# Patient Record
Sex: Male | Born: 1965
Health system: Southern US, Community
[De-identification: ages and names within clinical notes are randomized; demographics above are authoritative.]

## PROBLEM LIST (undated history)

## (undated) DIAGNOSIS — M1990 Unspecified osteoarthritis, unspecified site: Secondary | ICD-10-CM

## (undated) DIAGNOSIS — I10 Essential (primary) hypertension: Secondary | ICD-10-CM

## (undated) DIAGNOSIS — G8929 Other chronic pain: Secondary | ICD-10-CM

## (undated) DIAGNOSIS — M549 Dorsalgia, unspecified: Secondary | ICD-10-CM

## (undated) HISTORY — DX: Unspecified osteoarthritis, unspecified site: M19.90

---

## 1998-10-22 ENCOUNTER — Emergency Department (HOSPITAL_COMMUNITY): Admission: EM | Admit: 1998-10-22 | Discharge: 1998-10-22 | Payer: Self-pay | Admitting: Emergency Medicine

## 2009-12-21 ENCOUNTER — Ambulatory Visit (HOSPITAL_COMMUNITY): Admission: RE | Admit: 2009-12-21 | Discharge: 2009-12-21 | Payer: Self-pay | Admitting: Family Medicine

## 2011-05-08 ENCOUNTER — Encounter (HOSPITAL_COMMUNITY): Payer: Self-pay | Admitting: Adult Health

## 2011-05-08 ENCOUNTER — Emergency Department (HOSPITAL_COMMUNITY): Payer: Worker's Compensation

## 2011-05-08 ENCOUNTER — Emergency Department (HOSPITAL_COMMUNITY)
Admission: EM | Admit: 2011-05-08 | Discharge: 2011-05-08 | Disposition: A | Discharge status: Home / Self Care | Payer: Worker's Compensation | Attending: Emergency Medicine | Admitting: Emergency Medicine

## 2011-05-08 DIAGNOSIS — W11XXXA Fall on and from ladder, initial encounter: Secondary | ICD-10-CM | POA: Insufficient documentation

## 2011-05-08 DIAGNOSIS — M545 Low back pain, unspecified: Secondary | ICD-10-CM | POA: Insufficient documentation

## 2011-05-08 DIAGNOSIS — S32010A Wedge compression fracture of first lumbar vertebra, initial encounter for closed fracture: Secondary | ICD-10-CM

## 2011-05-08 DIAGNOSIS — S32009A Unspecified fracture of unspecified lumbar vertebra, initial encounter for closed fracture: Secondary | ICD-10-CM | POA: Insufficient documentation

## 2011-05-08 DIAGNOSIS — I10 Essential (primary) hypertension: Secondary | ICD-10-CM | POA: Insufficient documentation

## 2011-05-08 DIAGNOSIS — T148XXA Other injury of unspecified body region, initial encounter: Secondary | ICD-10-CM | POA: Insufficient documentation

## 2011-05-08 HISTORY — DX: Essential (primary) hypertension: I10

## 2011-05-08 LAB — URINALYSIS, MICROSCOPIC ONLY
Glucose, UA: NEGATIVE mg/dL
Ketones, ur: NEGATIVE mg/dL
Leukocytes, UA: NEGATIVE
Nitrite: NEGATIVE
pH: 6.5 (ref 5.0–8.0)

## 2011-05-08 MED ORDER — IBUPROFEN 800 MG PO TABS: 800.0000 mg | ORAL_TABLET | Freq: Three times a day (TID) | ORAL | Status: AC

## 2011-05-08 MED ORDER — IOHEXOL 300 MG/ML  SOLN
100.0000 mL | Freq: Once | INTRAMUSCULAR | Status: AC | PRN
  Administered 2011-05-08: 100 mL via INTRAVENOUS

## 2011-05-08 MED ORDER — TETANUS-DIPHTH-ACELL PERTUSSIS 5-2.5-18.5 LF-MCG/0.5 IM SUSP
INTRAMUSCULAR | Status: AC
  Administered 2011-05-08: 0.5 mL via INTRAMUSCULAR
  Filled 2011-05-08: qty 0.5

## 2011-05-08 MED ORDER — KETOROLAC TROMETHAMINE 30 MG/ML IJ SOLN
30.0000 mg | Freq: Once | INTRAMUSCULAR | Status: AC
  Administered 2011-05-08: 30 mg via INTRAVENOUS
  Filled 2011-05-08: qty 1

## 2011-05-08 MED ORDER — OXYCODONE-ACETAMINOPHEN 5-325 MG PO TABS: 2.0000 | ORAL_TABLET | ORAL | Status: AC | PRN

## 2011-05-08 MED ORDER — OXYCODONE-ACETAMINOPHEN 5-325 MG PO TABS
2.0000 | ORAL_TABLET | Freq: Once | ORAL | Status: AC
  Administered 2011-05-08: 2 via ORAL
  Filled 2011-05-08 (×2): qty 1

## 2011-05-08 NOTE — ED Notes (Addendum)
Fell from 15 foot ladder, straight down, landed on lower back and on top of ladder.  C/o back pain and rib pain

## 2011-05-08 NOTE — ED Provider Notes (Signed)
Patient was signed out to me by PA Langley Adie. At the time of signout the patient was pending CT of abdomen and pelvis. Patient had known L1 compression fracture. CT did show 30% compression with no posterior element involvement. There is also subcutaneous hematoma of the right buttock visualized. There are no other significant findings. Patient was notified of his left renal cysts. Patient was given a dose of Toradol was discharged home with prescription for ibuprofen as well as Percocet. He was referred to Dr. August Saucer for followup regarding his compression fracture. Patient had no neurologic symptoms, evaluation. He was discharged in good condition.  Diagnoses: 1. L1 compression fracture 2. Hematoma of the right buttock  Cyndra Numbers, MD 05/08/11 1744

## 2011-05-08 NOTE — Discharge Instructions (Signed)
Back, Compression Fracture  A compression fracture happens when a force is put upon the length of your spine. Slipping and falling on your bottom are examples of such a force. When this happens, sometimes the force is great enough to compress the building blocks (vertebral bodies) of your spine. Although this causes a lot of pain, this can usually be treated at home, unless your caregiver feels hospitalization is needed for pain control.  Your backbone (spinal column) is made up of 24 main vertebral bodies in addition to the sacrum and coccyx (see illustration). These are held together by tough fibrous tissues (ligaments) and by support of your muscles. Nerve roots pass through the openings between the vertebrae. A sudden wrenching move, injury, or a fall may cause a compression fracture of one of the vertebral bodies. This may result in back pain or spread of pain into the belly (abdomen), the buttocks, and down the leg into the foot. Pain may also be created by muscle spasm alone.  Large studies have been undertaken to determine the best possible course of action to help your back following injury and also to prevent future problems. The recommendations are as follows.  FOLLOWING A COMPRESSION FRACTURE:  Do the following only if advised by your caregiver.    If a back brace has been suggested or provided, wear it as directed.   DO NOT stop wearing the back brace unless instructed by your caregiver.   When allowed to return to regular activities, avoid a sedentary life style. Actively exercise. Sporadic weekend binges of tennis, racquetball, water skiing, may actually aggravate or create problems, especially if you are not in condition for that activity.   Avoid sports requiring sudden body movements until you are in condition for them. Swimming and walking are safer activities.   Maintain good posture.   Avoid obesity.   If not already done, you should have a DEXA scan. Based on the results, be treated for  osteoporosis.  FOLLOWING ACUTE (SUDDEN) INJURY:   Only take over-the-counter or prescription medicines for pain, discomfort, or fever as directed by your caregiver.   Use bed rest for only the most extreme acute episode. Prolonged bed rest may aggravate your condition. Ice used for acute conditions is effective. Use a large plastic bag filled with ice. Wrap it in a towel. This also provides excellent pain relief. This may be continuous. Or use it for 30 minutes every 2 hours during acute phase, then as needed. Heat for 30 minutes prior to activities is helpful.   As soon as the acute phase (the time when your back is too painful for you to do normal activities) is over, it is important to resume normal activities and work hardening programs. Back injuries can cause potentially marked changes in lifestyle. So it is important to attack these problems aggressively.   See your caregiver for continued problems. He or she can help or refer you for appropriate exercises, physical therapy and work hardening if needed.   If you are given narcotic medications for your condition, for the next 24 hours DO NOT:   Drive   Operate machinery or power tools.   Sign legal documents.   DO NOT drink alcohol, take sleeping pills or other medications that may interfere with treatment.  If your caregiver has given you a follow-up appointment, it is very important to keep that appointment. Not keeping the appointment could result in a chronic or permanent injury, pain, and disability. If there is any   back to this facility for assistance.  SEEK IMMEDIATE MEDICAL CARE IF:  You develop numbness, tingling, weakness, or problems with the use of your arms or legs.   You develop severe back pain not relieved with medications.   You have changes in bowel or bladder control.   You have increasing pain in any areas of the body.  Document Released: 02/18/2005  Document Revised: 02/07/2011 Document Reviewed: 09/23/2007 [facility] Patient Information 2012 [place], [place].Hematoma A hematoma is a pocket of blood that collects under the skin, in an organ, in a body space, in a joint space, or in other tissue. The blood can clot to form a lump that you can see and feel. The lump is often firm, sore, and sometimes even painful and tender. Most hematomas get better in a few days to weeks. However, some hematomas may be serious and require medical care.Hematomas can range in size from very small to very large. CAUSES  A hematoma can be caused by a blunt or penetrating injury. It can also be caused by leakage from a blood vessel under the skin. Spontaneous leakage from a blood vessel is more likely to occur in elderly people, especially those taking blood thinners. Sometimes, a hematoma can develop after certain medical procedures. SYMPTOMS  Unlike a bruise, a hematoma forms a firm lump that you can feel. This lump is the collection of blood. The collection of blood can also cause your skin to turn a blue to dark blue color. If the hematoma is close to the surface of the skin, it often produces a yellowish color in the skin. DIAGNOSIS  Your caregiver can determine whether you have a hematoma based on your history and a physical exam. TREATMENT  Hematomas usually go away on their own over time. Rarely does the blood need to be drained out of the body. HOME CARE INSTRUCTIONS   Put ice on the injured area.   Put ice in a plastic bag.   Place a towel between your skin and the bag.   Leave the ice on for 15 to 20 minutes, 3 to 4 times a day for the first 1 to 2 days.   After the first 2 days, switch to using warm compresses on the hematoma.   Elevate the injured area to help decrease pain and swelling. Wrapping the area with an elastic bandage may also be helpful. Compression helps to reduce swelling and promotes shrinking of the hematoma. Make sure the bandage is  not wrapped too tight.   If your hematoma is on a lower extremity and is painful, crutches may be helpful for a couple days.   Only take over-the-counter or prescription medicines for pain, discomfort, or fever as directed by your caregiver. Most patients can take acetaminophen or ibuprofen for the pain.  SEEK IMMEDIATE MEDICAL CARE IF:   You have increasing pain, or your pain is not controlled with medicine.   You have a fever.   You have worsening swelling or discoloration.   Your skin over the hematoma breaks or starts bleeding.  MAKE SURE YOU:   Understand these instructions.   Will watch your condition.   Will get help right away if you are not doing well or get worse.  Document Released: 10/03/2003 Document Revised: 02/07/2011 Document Reviewed: 10/22/2010 [facility] Patient Information 2012 [place], [place].

## 2011-05-08 NOTE — ED Provider Notes (Signed)
History     CSN: [id number]  Arrival date & time 05/08/11  1349   First MD Initiated Contact with Patient 05/08/11 1359      Chief Complaint  Patient presents with  . Fall    (Consider location/radiation/quality/duration/timing/severity/associated sxs/prior treatment) Patient is a 46 y.o. male presenting with fall. The history is provided by the patient.  Fall The accident occurred less than 1 hour ago. The fall occurred from a ladder. He fell from a height of 11 to 15 ft. He landed on dirt. There was no blood loss. Point of impact: His ladder slip in the mud and patient landed on his feet, falling backward onto his back.  Pain location: His only complaint is lower back pain. He was ambulatory at the scene. Pertinent negatives include no fever, no numbness and no abdominal pain. Associated symptoms comments: He denies having hit his head or presence of neck, chest or abdominal pain. He reports he got himself up from the scene of fall.. Treatment on scene includes a backboard and a c-collar. He has tried nothing for the symptoms.    Past Medical History  Diagnosis Date  . Hypertension     History reviewed. No pertinent past surgical history.  History reviewed. No pertinent family history.  History  Substance Use Topics  . Smoking status: Former Smoker    Types: Cigarettes  . Smokeless tobacco: Not on file  . Alcohol Use: No      Review of Systems  Constitutional: Negative for fever and chills.  HENT: Negative.   Respiratory: Negative.   Cardiovascular: Negative.   Gastrointestinal: Negative.  Negative for abdominal pain.  Musculoskeletal: Positive for back pain.  Skin: Negative.   Neurological: Negative.  Negative for dizziness, light-headedness and numbness.    Allergies  Review of patient's allergies indicates no known allergies.  Home Medications   Current Outpatient Rx  Name Route Sig Dispense Refill  . LOSARTAN POTASSIUM 100 MG PO TABS Oral Take 100 mg by  mouth daily.    Marland Kitchen VARENICLINE TARTRATE 0.5 MG PO TABS Oral Take 0.5 mg by mouth 2 (two) times daily.      BP 161/97  Temp(Src) 97.6 F (36.4 C) (Oral)  Resp 18  Physical Exam  Constitutional: He is oriented to person, place, and time. He appears well-developed and well-nourished.  HENT:  Head: Normocephalic and atraumatic.  Neck: Normal range of motion. Neck supple.  Cardiovascular: Normal rate and regular rhythm.   Pulmonary/Chest: Effort normal and breath sounds normal.  Abdominal: Soft. Bowel sounds are normal. There is no tenderness. There is no rebound and no guarding.  Musculoskeletal: Normal range of motion.       Mild mid-line lumbar tenderness. No swelling or discoloration.  Neurological: He is alert and oriented to person, place, and time. He displays normal reflexes. No cranial nerve deficit. Coordination normal.  Skin: Skin is warm and dry. No rash noted.  Psychiatric: He has a normal mood and affect.    ED Course  Procedures (including critical care time)  Labs Reviewed - No data to display Dg Lumbar Spine Complete  05/08/2011  *RADIOLOGY REPORT*  Clinical Data: History of fall off a ladder earlier today complaining of low back pain.  LUMBAR SPINE - COMPLETE 4+ VIEW  Comparison: No priors.  Findings: There is a compression fracture of L1 with approximately 30% loss of anterior vertebral body height. Frontal projection demonstrates a focal scoliotic deformity at the L1 level (convex to the right).  No  definite retropulsion of fracture fragments is noted on the lateral projection at this time to suggest a burst type fracture.  No other fractures are confidently identified. Mild multilevel degenerative disc disease is noted.  Facet arthropathy is seen throughout the lumbar spine, most severe at L4- L5 and L5-S1.  IMPRESSION: 1.  Compression fracture of L1 with approximately 30% loss of anterior vertebral body height.  No priors are available for comparison, however, based on  patient's history and symptoms, this is favored to represent an acute injury.  This could be confirmed with CT of the lumbar spine if clinically indicated.  Original Report Authenticated By: Florencia Reasons, M.D.     No diagnosis found.    MDM          Rodena Medin, PA-C 05/08/11 1533

## 2011-05-09 NOTE — ED Provider Notes (Signed)
Medical screening examination/treatment/procedure(s) were conducted as a shared visit with non-physician practitioner(s) and myself.  I personally evaluated the patient during the encounter This 46 year old male presents following a fall that occurred as a ladder he was on slid away from the structure it was leaning against.  The patient's description of a fall of greater than 10 feet from his abdominal tenderness, his L1 fracture demonstrated on x-ray was concerning for hemorrhage or soft tissue injury.  The patient's CT did not demonstrate these findings.  On my exam the patient was in no distress, though he was quite comfortable.  The patient was discharged in stable condition.  Gerhard Munch, MD 05/09/11 [phone]

## 2012-04-12 ENCOUNTER — Emergency Department (HOSPITAL_COMMUNITY)
Admission: EM | Admit: 2012-04-12 | Discharge: 2012-04-12 | Disposition: A | Discharge status: Home / Self Care | Payer: BC Managed Care – PPO | Attending: Emergency Medicine | Admitting: Emergency Medicine

## 2012-04-12 ENCOUNTER — Emergency Department (HOSPITAL_COMMUNITY): Payer: BC Managed Care – PPO

## 2012-04-12 ENCOUNTER — Encounter (HOSPITAL_COMMUNITY): Payer: Self-pay

## 2012-04-12 DIAGNOSIS — Z87891 Personal history of nicotine dependence: Secondary | ICD-10-CM | POA: Insufficient documentation

## 2012-04-12 DIAGNOSIS — R1909 Other intra-abdominal and pelvic swelling, mass and lump: Secondary | ICD-10-CM | POA: Insufficient documentation

## 2012-04-12 DIAGNOSIS — M799 Soft tissue disorder, unspecified: Secondary | ICD-10-CM

## 2012-04-12 DIAGNOSIS — G8929 Other chronic pain: Secondary | ICD-10-CM | POA: Insufficient documentation

## 2012-04-12 DIAGNOSIS — R21 Rash and other nonspecific skin eruption: Secondary | ICD-10-CM

## 2012-04-12 DIAGNOSIS — Z79899 Other long term (current) drug therapy: Secondary | ICD-10-CM | POA: Insufficient documentation

## 2012-04-12 DIAGNOSIS — I10 Essential (primary) hypertension: Secondary | ICD-10-CM | POA: Insufficient documentation

## 2012-04-12 DIAGNOSIS — M549 Dorsalgia, unspecified: Secondary | ICD-10-CM | POA: Insufficient documentation

## 2012-04-12 HISTORY — DX: Dorsalgia, unspecified: M54.9

## 2012-04-12 HISTORY — DX: Other chronic pain: G89.29

## 2012-04-12 MED ORDER — TRIAMCINOLONE ACETONIDE 0.1 % EX CREA: TOPICAL_CREAM | Freq: Two times a day (BID) | CUTANEOUS | Status: DC

## 2012-04-12 NOTE — ED Notes (Signed)
Rash ans mild swelling to left lower ab x2 days

## 2012-04-12 NOTE — ED Provider Notes (Signed)
History     CSN: [id number]  Arrival date & time 04/12/12  4098   First MD Initiated Contact with Patient 04/12/12 [phone]      Chief Complaint  Patient presents with  . Rash    (Consider location/radiation/quality/duration/timing/severity/associated sxs/prior treatment) Patient is a 47 y.o. male presenting with rash. The history is provided by the patient.  Rash Location: left abd. Quality: redness and swelling   Quality: not itchy and not painful   Severity:  Moderate Onset quality:  Gradual Duration:  4 days Timing:  Constant Progression:  Unchanged Chronicity:  New Context: not animal contact, not food, not hot tub use, not insect bite/sting, not new detergent/soap and not sick contacts   Relieved by:  Nothing Worsened by:  Nothing tried Ineffective treatments:  None tried Associated symptoms: no abdominal pain, no joint pain, no shortness of breath and not wheezing   Associated symptoms comment:  Abdomen swelling on the left side.   Past Medical History  Diagnosis Date  . Hypertension   . Chronic back pain     History reviewed. No pertinent past surgical history.  No family history on file.  History  Substance Use Topics  . Smoking status: Former Smoker    Types: Cigarettes  . Smokeless tobacco: Not on file  . Alcohol Use: No      Review of Systems  Constitutional: Negative for activity change.       All ROS Neg except as noted in HPI  HENT: Negative for nosebleeds and neck pain.   Eyes: Negative for photophobia and discharge.  Respiratory: Negative for cough, shortness of breath and wheezing.   Cardiovascular: Negative for chest pain and palpitations.  Gastrointestinal: Negative for abdominal pain and blood in stool.  Genitourinary: Negative for dysuria, frequency and hematuria.  Musculoskeletal: Positive for back pain. Negative for arthralgias.  Skin: Positive for rash.  Neurological: Negative for dizziness, seizures and speech difficulty.   Psychiatric/Behavioral: Negative for hallucinations and confusion.    Allergies  Review of patient's allergies indicates no known allergies.  Home Medications   Current Outpatient Rx  Name  Route  Sig  Dispense  Refill  . HYDROcodone-acetaminophen (NORCO/VICODIN) 5-325 MG per tablet   Oral   Take 1 tablet by mouth every 6 (six) hours as needed for pain.         Marland Kitchen ibuprofen (ADVIL,MOTRIN) 800 MG tablet   Oral   Take 800 mg by mouth every 8 (eight) hours as needed for pain.         Marland Kitchen losartan (COZAAR) 100 MG tablet   Oral   Take 100 mg by mouth daily.         . traMADol (ULTRAM) 50 MG tablet   Oral   Take 50 mg by mouth every 6 (six) hours as needed for pain.           BP 134/90  Pulse 86  Temp(Src) 97.7 F (36.5 C) (Oral)  Resp 20  Ht 6\' 2"  (1.88 m)  Wt 270 lb (122.471 kg)  BMI 34.65 kg/m2  SpO2 100%  Physical Exam  Nursing note and vitals reviewed. Constitutional: He is oriented to person, place, and time. He appears well-developed and well-nourished.  Non-toxic appearance.  HENT:  Head: Normocephalic.  Right Ear: Tympanic membrane and external ear normal.  Left Ear: Tympanic membrane and external ear normal.  Eyes: EOM and lids are normal. Pupils are equal, round, and reactive to light.  Neck: Normal range of motion. Neck  supple. Carotid bruit is not present.  Cardiovascular: Normal rate, regular rhythm, normal heart sounds, intact distal pulses and normal pulses.   Pulmonary/Chest: Breath sounds normal. No respiratory distress.  Abdominal: Soft. Bowel sounds are normal. There is no tenderness. There is no guarding.  Musculoskeletal: Normal range of motion.  Lymphadenopathy:       Head (right side): No submandibular adenopathy present.       Head (left side): No submandibular adenopathy present.    He has no cervical adenopathy.  Neurological: He is alert and oriented to person, place, and time. He has normal strength. No cranial nerve deficit or  sensory deficit.  Skin: Skin is warm and dry. Rash noted.  Psychiatric: He has a normal mood and affect. His speech is normal.    ED Course  Procedures (including critical care time)  Labs Reviewed - No data to display No results found.   No diagnosis found.    MDM  I have reviewed nursing notes, vital signs, and all appropriate lab and imaging results for this patient. Red macular rash of the left lower abd. No reported contact allergens. No high fever.  Pt to see dermatology. Triamcinolone cream ordered.       Kathie Dike, [place] 04/18/12 1616

## 2012-04-20 NOTE — ED Provider Notes (Signed)
Medical screening examination/treatment/procedure(s) were performed by non-physician practitioner and as supervising physician I was immediately available for consultation/collaboration.   Tuck Dulworth M Dorcas Melito, DO 04/20/12 1344 

## 2015-03-27 ENCOUNTER — Telehealth: Payer: Self-pay

## 2015-03-27 NOTE — Telephone Encounter (Signed)
[phone]  Patient received letter to schedule tcs

## 2015-03-29 ENCOUNTER — Telehealth: Payer: Self-pay

## 2015-03-30 ENCOUNTER — Other Ambulatory Visit: Payer: Self-pay

## 2015-03-30 DIAGNOSIS — Z1211 Encounter for screening for malignant neoplasm of colon: Secondary | ICD-10-CM

## 2015-03-30 MED ORDER — SOD PICOSULFATE-MAG OX-CIT ACD 10-3.5-12 MG-GM-GM PO PACK: 1.0000 | PACK | ORAL | Status: DC

## 2015-03-30 NOTE — Telephone Encounter (Signed)
See separate triage.  

## 2015-03-30 NOTE — Telephone Encounter (Signed)
PREPOPIK-DRINK WATER TO KEEP URINE LIGHT YELLOW. FULL LIQUIDS WITH BREAKFAST.  Full Liquid Diet A high-calorie, high-protein supplement should be used to meet your nutritional requirements when the full liquid diet is continued for more than 2 or 3 days. If this diet is to be used for an extended period of time (more than 7 days), a multivitamin should be considered.  Breads and Starches  Allowed: None are allowed except crackers pureed (made into a thick, smooth soup) in soup.   Avoid: Any others.    Potatoes/Pasta/Rice  Allowed: ANY ITEM AS A SOUP OR SMALL PLATE OF MASHED POTATOES OR SCRAMBLED EGGS.       Vegetables  Allowed: Strained tomato or vegetable juice. Vegetables pureed in soup.   Avoid: Any others.    Fruit  Allowed: Any strained fruit juices and fruit drinks. Include 1 serving of citrus or vitamin C-enriched fruit juice daily.   Avoid: Any others.  Meat and Meat Substitutes  Allowed: Egg  Avoid: Any meat, fish, or fowl. All cheese.  Milk  Allowed: SOY Milk beverages, including milk shakes and instant breakfast mixes. Smooth yogurt.   Avoid: Any others. Avoid dairy products if not tolerated.    Soups and Combination Foods  Allowed: Broth, strained cream soups. Strained, broth-based soups.   Avoid: Any others.    Desserts and Sweets  Allowed: flavored gelatin, tapioca, ice cream, sherbet, smooth pudding, junket, fruit ices, frozen ice pops, pudding pops, frozen fudge pops, chocolate syrup. Sugar, honey, jelly, syrup.   Avoid: Any others.  Fats and Oils  Allowed: Margarine, butter, cream, sour cream, oils.   Avoid: Any others.  Beverages  Allowed: All.   Avoid: None.  Condiments  Allowed: Iodized salt, pepper, spices, flavorings. Cocoa powder.   Avoid: Any others.    SAMPLE MEAL PLAN Breakfast   cup orange juice.   1 OR 2 EGGS  1 cup milk.   1 cup beverage (coffee or tea).   Cream or sugar, if desired.    Midmorning  Snack  2 SCRAMBLED OR HARD BOILED EGG   Lunch  1 cup cream soup.    cup fruit juice.   1 cup milk.    cup custard.   1 cup beverage (coffee or tea).   Cream or sugar, if desired.    Midafternoon Snack  1 cup milk shake.  Dinner  1 cup cream soup.    cup fruit juice.   1 cup MILK    cup pudding.   1 cup beverage (coffee or tea).   Cream or sugar, if desired.  Evening Snack  1 cup supplement.  To increase calories, add sugar, cream, butter, or margarine if possible. Nutritional supplements will also increase the total calories.

## 2015-03-30 NOTE — Telephone Encounter (Signed)
Rx sent to the pharmacy and instructions mailed to pt.  

## 2015-03-30 NOTE — Telephone Encounter (Signed)
Gastroenterology Pre-Procedure Review  Request Date: 03/29/2015 Requesting Physician: Dr. Hilma Favors  PATIENT REVIEW QUESTIONS: The patient responded to the following health history questions as indicated:    1. Diabetes Melitis: no 2. Joint replacements in the past 12 months: no 3. Major health problems in the past 3 months: no 4. Has an artificial valve or MVP: no 5. Has a defibrillator: no 6. Has been advised in past to take antibiotics in advance of a procedure like teeth cleaning: no 7. Family history of colon cancer: no  8. Alcohol Use: no 9. History of sleep apnea: no     MEDICATIONS & ALLERGIES:    Patient reports the following regarding taking any blood thinners:   Plavix? no Aspirin? YES Coumadin? no  Patient confirms/reports the following medications:  Current Outpatient Prescriptions  Medication Sig Dispense Refill  . aspirin 81 MG tablet Take 81 mg by mouth daily.    . naproxen sodium (ANAPROX) 220 MG tablet Take 220 mg by mouth 2 (two) times daily with a meal. As needed only    . HYDROcodone-acetaminophen (NORCO/VICODIN) 5-325 MG per tablet Take 1 tablet by mouth every 6 (six) hours as needed for pain. Reported on 03/29/2015    . ibuprofen (ADVIL,MOTRIN) 800 MG tablet Take 800 mg by mouth every 8 (eight) hours as needed for pain. Reported on 03/29/2015    . losartan (COZAAR) 100 MG tablet Take 100 mg by mouth daily. Reported on 03/29/2015    . traMADol (ULTRAM) 50 MG tablet Take 50 mg by mouth every 6 (six) hours as needed for pain. Reported on 03/29/2015    . triamcinolone cream (KENALOG) 0.1 % Apply topically 2 (two) times daily. (Patient not taking: Reported on 03/29/2015) 15 g 0   No current facility-administered medications for this visit.    Patient confirms/reports the following allergies:  No Known Allergies  No orders of the defined types were placed in this encounter.    AUTHORIZATION INFORMATION Primary Insurance:  ID #:  Group #:  Pre-Cert / Auth  required: Pre-Cert / Auth #:   Secondary Insurance:  ID #: Group #:  Pre-Cert / Auth required: Pre-Cert / Auth #:   SCHEDULE INFORMATION: Procedure has been scheduled as follows:  Date: 04/17/2015              Time:  1:00 PM Location: [facility] Short Stay  This Gastroenterology Pre-Precedure Review Form is being routed to the following provider(s): Barney Drain, MD

## 2015-04-13 ENCOUNTER — Telehealth: Payer: Self-pay

## 2015-04-13 NOTE — Telephone Encounter (Signed)
I called [employer] at [phone] and spoke to Hurley who said that a PA is not required for screening colonoscopy as out patient.

## 2015-04-17 ENCOUNTER — Encounter (HOSPITAL_COMMUNITY)
Admission: RE | Disposition: A | Discharge status: Home / Self Care | Payer: Self-pay | Source: Ambulatory Visit | Attending: Gastroenterology

## 2015-04-17 ENCOUNTER — Encounter (HOSPITAL_COMMUNITY): Payer: Self-pay

## 2015-04-17 ENCOUNTER — Ambulatory Visit (HOSPITAL_COMMUNITY)
Admission: RE | Admit: 2015-04-17 | Discharge: 2015-04-17 | Disposition: A | Discharge status: Home / Self Care | Payer: BLUE CROSS/BLUE SHIELD | Source: Ambulatory Visit | Attending: Gastroenterology | Admitting: Gastroenterology

## 2015-04-17 DIAGNOSIS — I10 Essential (primary) hypertension: Secondary | ICD-10-CM | POA: Diagnosis not present

## 2015-04-17 DIAGNOSIS — D122 Benign neoplasm of ascending colon: Secondary | ICD-10-CM

## 2015-04-17 DIAGNOSIS — K648 Other hemorrhoids: Secondary | ICD-10-CM | POA: Insufficient documentation

## 2015-04-17 DIAGNOSIS — Z791 Long term (current) use of non-steroidal anti-inflammatories (NSAID): Secondary | ICD-10-CM | POA: Diagnosis not present

## 2015-04-17 DIAGNOSIS — Z1211 Encounter for screening for malignant neoplasm of colon: Secondary | ICD-10-CM | POA: Diagnosis not present

## 2015-04-17 DIAGNOSIS — Z87891 Personal history of nicotine dependence: Secondary | ICD-10-CM | POA: Insufficient documentation

## 2015-04-17 DIAGNOSIS — Z7982 Long term (current) use of aspirin: Secondary | ICD-10-CM | POA: Diagnosis not present

## 2015-04-17 HISTORY — PX: COLONOSCOPY: SHX5424

## 2015-04-17 SURGERY — COLONOSCOPY
Anesthesia: Moderate Sedation

## 2015-04-17 MED ORDER — STERILE WATER FOR IRRIGATION IR SOLN
Status: DC | PRN
  Administered 2015-04-17: 14:00:00

## 2015-04-17 MED ORDER — MIDAZOLAM HCL 5 MG/5ML IJ SOLN
INTRAMUSCULAR | Status: DC
Start: 2015-04-17 — End: 2015-04-17
  Filled 2015-04-17: qty 10

## 2015-04-17 MED ORDER — MEPERIDINE HCL 100 MG/ML IJ SOLN
INTRAMUSCULAR | Status: AC
  Filled 2015-04-17: qty 2

## 2015-04-17 MED ORDER — MEPERIDINE HCL 100 MG/ML IJ SOLN
INTRAMUSCULAR | Status: DC | PRN
  Administered 2015-04-17: 25 mg via INTRAVENOUS
  Administered 2015-04-17: 50 mg via INTRAVENOUS
  Administered 2015-04-17: 25 mg via INTRAVENOUS

## 2015-04-17 MED ORDER — MIDAZOLAM HCL 5 MG/5ML IJ SOLN
INTRAMUSCULAR | Status: DC | PRN
  Administered 2015-04-17 (×2): 2 mg via INTRAVENOUS
  Administered 2015-04-17: 1 mg via INTRAVENOUS

## 2015-04-17 NOTE — Discharge Instructions (Signed)
You had 1 polyp removed. You have SMALL internal hemorrhoids.   FOLLOW A HIGH FIBER DIET. AVOID ITEMS THAT CAUSE BLOATING & GAS. SEE INFO BELOW.  YOUR BIOPSY RESULTS WILL BE AVAILABLE IN MY CHART FEB 16 AND MY OFFICE WILL CONTACT YOU IN 10-14 DAYS WITH YOUR RESULTS.   Next colonoscopy in 5-10 years.    Colonoscopy Care After Read the instructions outlined below and refer to this sheet in the next week. These discharge instructions provide you with general information on caring for yourself after you leave the hospital. While your treatment has been planned according to the most current medical practices available, unavoidable complications occasionally occur. If you have any problems or questions after discharge, call DR. Yousef Huge, [phone].  ACTIVITY  You may resume your regular activity, but move at a slower pace for the next 24 hours.   Take frequent rest periods for the next 24 hours.   Walking will help get rid of the air and reduce the bloated feeling in your belly (abdomen).   No driving for 24 hours (because of the medicine (anesthesia) used during the test).   You may shower.   Do not sign any important legal documents or operate any machinery for 24 hours (because of the anesthesia used during the test).    NUTRITION  Drink plenty of fluids.   You may resume your normal diet as instructed by your doctor.   Begin with a light meal and progress to your normal diet. Heavy or fried foods are harder to digest and may make you feel sick to your stomach (nauseated).   Avoid alcoholic beverages for 24 hours or as instructed.    MEDICATIONS  You may resume your normal medications.   WHAT YOU CAN EXPECT TODAY  Some feelings of bloating in the abdomen.   Passage of more gas than usual.   Spotting of blood in your stool or on the toilet paper  .  IF YOU HAD POLYPS REMOVED DURING THE COLONOSCOPY:  Eat a soft diet IF YOU HAVE NAUSEA, BLOATING, ABDOMINAL PAIN, OR  VOMITING.    FINDING OUT THE RESULTS OF YOUR TEST Not all test results are available during your visit. DR. Oneida Alar WILL CALL YOU WITHIN 14 DAYS OF YOUR PROCEDUE WITH YOUR RESULTS. Do not assume everything is normal if you have not heard from DR. Ocean Schildt, CALL HER OFFICE AT [phone].  SEEK IMMEDIATE MEDICAL ATTENTION AND CALL THE OFFICE: [phone] IF:  You have more than a spotting of blood in your stool.   Your belly is swollen (abdominal distention).   You are nauseated or vomiting.   You have a temperature over 101F.   You have abdominal pain or discomfort that is severe or gets worse throughout the day.   High-Fiber Diet A high-fiber diet changes your normal diet to include more whole grains, legumes, fruits, and vegetables. Changes in the diet involve replacing refined carbohydrates with unrefined foods. The calorie level of the diet is essentially unchanged. The Dietary Reference Intake (recommended amount) for adult males is 38 grams per day. For adult females, it is 25 grams per day. Pregnant and lactating women should consume 28 grams of fiber per day. Fiber is the intact part of a plant that is not broken down during digestion. Functional fiber is fiber that has been isolated from the plant to provide a beneficial effect in the body. PURPOSE  Increase stool bulk.   Ease and regulate bowel movements.   Lower cholesterol.  REDUCE  RISK OF COLON CANCER  INDICATIONS THAT YOU NEED MORE FIBER  Constipation and hemorrhoids.   Uncomplicated diverticulosis (intestine condition) and irritable bowel syndrome.   Weight management.   As a protective measure against hardening of the arteries (atherosclerosis), diabetes, and cancer.   GUIDELINES FOR INCREASING FIBER IN THE DIET  Start adding fiber to the diet slowly. A gradual increase of about 5 more grams (2 slices of whole-wheat bread, 2 servings of most fruits or vegetables, or 1 bowl of high-fiber cereal) per day is  best. Too rapid an increase in fiber may result in constipation, flatulence, and bloating.   Drink enough water and fluids to keep your urine clear or pale yellow. Water, juice, or caffeine-free drinks are recommended. Not drinking enough fluid may cause constipation.   Eat a variety of high-fiber foods rather than one type of fiber.   Try to increase your intake of fiber through using high-fiber foods rather than fiber pills or supplements that contain small amounts of fiber.   The goal is to change the types of food eaten. Do not supplement your present diet with high-fiber foods, but replace foods in your present diet.   INCLUDE A VARIETY OF FIBER SOURCES  Replace refined and processed grains with whole grains, canned fruits with fresh fruits, and incorporate other fiber sources. White rice, white breads, and most bakery goods contain little or no fiber.   Brown whole-grain rice, buckwheat oats, and many fruits and vegetables are all good sources of fiber. These include: broccoli, Brussels sprouts, cabbage, cauliflower, beets, sweet potatoes, white potatoes (skin on), carrots, tomatoes, eggplant, squash, berries, fresh fruits, and dried fruits.   Cereals appear to be the richest source of fiber. Cereal fiber is found in whole grains and bran. Bran is the fiber-rich outer coat of cereal grain, which is largely removed in refining. In whole-grain cereals, the bran remains. In breakfast cereals, the largest amount of fiber is found in those with "bran" in their names. The fiber content is sometimes indicated on the label.   You may need to include additional fruits and vegetables each day.   In baking, for 1 cup white flour, you may use the following substitutions:   1 cup whole-wheat flour minus 2 tablespoons.   1/2 cup white flour plus 1/2 cup whole-wheat flour.   Polyps, Colon  A polyp is extra tissue that grows inside your body. Colon polyps grow in the large intestine. The large  intestine, also called the colon, is part of your digestive system. It is a long, hollow tube at the end of your digestive tract where your body makes and stores stool. Most polyps are not dangerous. They are benign. This means they are not cancerous. But over time, some types of polyps can turn into cancer. Polyps that are smaller than a pea are usually not harmful. But larger polyps could someday become or may already be cancerous. To be safe, doctors remove all polyps and test them.   PREVENTION There is not one sure way to prevent polyps. You might be able to lower your risk of getting them if you:  Eat more fruits and vegetables and less fatty food.   Do not smoke.   Avoid alcohol.   Exercise every day.   Lose weight if you are overweight.   Eating more calcium and folate can also lower your risk of getting polyps. Some foods that are rich in calcium are milk, cheese, and broccoli. Some foods that are rich  in folate are chickpeas, kidney beans, and spinach.   Hemorrhoids Hemorrhoids are dilated (enlarged) veins around the rectum. Sometimes clots will form in the veins. This makes them swollen and painful. These are called thrombosed hemorrhoids. Causes of hemorrhoids include:  Constipation.   Straining to have a bowel movement.   HEAVY LIFTING  HOME CARE INSTRUCTIONS  Eat a well balanced diet and drink 6 to 8 glasses of water every day to avoid constipation. You may also use a bulk laxative.   Avoid straining to have bowel movements.   Keep anal area dry and clean.   Do not use a donut shaped pillow or sit on the toilet for long periods. This increases blood pooling and pain.   Move your bowels when your body has the urge; this will require less straining and will decrease pain and pressure.

## 2015-04-17 NOTE — Op Note (Signed)
[facility] [address] [place], [address]   COLONOSCOPY PROCEDURE REPORT  PATIENT: Luke Chavez, Luke Chavez  MR#: [id number] BIRTHDATE: May 02, 1965 , 49  yrs. old GENDER: male ENDOSCOPIST: Danie Binder, MD REFERRED [id number] Hilma Favors, M.D. PROCEDURE DATE:  May 02, 2015 PROCEDURE:   Colonoscopy with snare polypectomy INDICATIONS:average risk patient for colon cancer. MEDICATIONS: Demerol 100 mg IV and Versed 5 mg IV MD INITIATED SEDATION: 1340 PROCEDURE COMPLETE: 1412  DESCRIPTION OF PROCEDURE:    Physical exam was performed.  Informed consent was obtained from the patient after explaining the benefits, risks, and alternatives to procedure.  The patient was connected to monitor and placed in left lateral position. Continuous oxygen was provided by nasal cannula and IV medicine administered through an indwelling cannula.  After administration of sedation and rectal exam, the patients rectum was intubated and the [id number]  colonoscope was advanced under direct visualization to the cecum.  The scope was removed slowly by carefully examining the color, texture, anatomy, and integrity mucosa on the way out.  The patient was recovered in endoscopy and discharged home in satisfactory condition. Estimated blood loss is zero unless otherwise noted in this procedure report.     COLON FINDINGS: A sessile polyp measuring 6 mm in size was found in the ascending colon.  A polypectomy was performed using snare cautery.  , Small internal hemorrhoids were found.  , and The examination was otherwise normal.  PREP QUALITY: good. CECAL W/D TIME: 16       minutes  COMPLICATIONS: None  ENDOSCOPIC IMPRESSION: 1.   ONE ASCENDING COLON polyp REMOVED 2.   Small internal hemorrhoids  RECOMMENDATIONS: HIGH FIBER DIET AWAIT BIOPSY NEXT TCS IN 5-10 YEARS    _______________________________ eSignedDanie Binder, MD 05-02-15 8:08 PM    CPT CODES: ICD CODES:  The  ICD and CPT codes recommended by this software are interpretations from the data that the clinical staff has captured with the software.  The verification of the translation of this report to the ICD and CPT codes and modifiers is the sole responsibility of the health care institution and practicing physician where this report was generated.  [employer] will not be held responsible for the validity of the ICD and CPT codes included on this report.  AMA assumes no liability for data contained or not contained herein. CPT is a Designer, television/film set of the [employer].

## 2015-04-17 NOTE — H&P (Signed)
  Primary Care Physician:  Purvis Kilts, MD Primary Gastroenterologist:  Dr. Oneida Alar  Pre-Procedure History & Physical: HPI:  Luke Chavez is a 50 y.o. male here for COLON CANCER SCREENING.  Past Medical History  Diagnosis Date  . Hypertension   . Chronic back pain     History reviewed. No pertinent past surgical history.  Prior to Admission medications   Medication Sig Start Date End Date Taking? Authorizing Provider  aspirin 81 MG tablet Take 81 mg by mouth daily.   Yes Historical Provider, MD  naproxen sodium (ALEVE) 220 MG tablet Take 220 mg by mouth 2 (two) times daily with a meal.   Yes Historical Provider, MD  Sod Picosulfate-Mag Ox-Cit Acd 10-3.5-12 MG-GM-GM PACK Take 1 Container by mouth as directed. 03/30/15  Yes Danie Binder, MD    Allergies as of 03/30/2015  . (No Known Allergies)    History reviewed. No pertinent family history.  Social History   Social History  . Marital Status: Married    Spouse Name: N/A  . Number of Children: N/A  . Years of Education: N/A   Occupational History  . Not on file.   Social History Main Topics  . Smoking status: Former Smoker    Types: Cigarettes  . Smokeless tobacco: Not on file  . Alcohol Use: No  . Drug Use: Yes    Special: Cocaine, Marijuana     Comment: former  . Sexual Activity: Not on file   Other Topics Concern  . Not on file   Social History Narrative    Review of Systems: See HPI, otherwise negative ROS   Physical Exam: BP 126/78 mmHg  Pulse 70  Temp(Src) 95.2 F (35.1 C) (Oral)  Resp 21  Ht 6\' 2"  (1.88 m)  Wt 253 lb (114.76 kg)  BMI 32.47 kg/m2  SpO2 100% General:   Alert,  pleasant and cooperative in NAD Head:  Normocephalic and atraumatic. Neck:  Supple; Lungs:  Clear throughout to auscultation.    Heart:  Regular rate and rhythm. Abdomen:  Soft, nontender and nondistended. Normal bowel sounds, without guarding, and without rebound.   Neurologic:  Alert and  oriented x4;   grossly normal neurologically.  Impression/Plan:     SCREENING  Plan:  1. TCS TODAY

## 2015-04-20 ENCOUNTER — Encounter (HOSPITAL_COMMUNITY): Payer: Self-pay | Admitting: Gastroenterology

## 2015-04-27 ENCOUNTER — Telehealth: Payer: Self-pay | Admitting: Gastroenterology

## 2015-04-27 ENCOUNTER — Telehealth: Payer: Self-pay

## 2015-04-27 NOTE — Telephone Encounter (Signed)
Reminder in epic °

## 2015-04-27 NOTE — Telephone Encounter (Signed)
Please call pt. He had A simple adenoma removed from hIS colon. FOLLOW A High fiber diet. TCS IN 5-10 YEARS.

## 2015-04-27 NOTE — Telephone Encounter (Signed)
LMOM to call.

## 2015-04-27 NOTE — Telephone Encounter (Signed)
Pt received a triage letter from [facility]. Please call [phone]

## 2015-04-27 NOTE — Telephone Encounter (Signed)
Pt has already had colonoscopy. Please see results.

## 2015-04-28 NOTE — Telephone Encounter (Signed)
Letter mailed for pt to call.  

## 2015-05-08 ENCOUNTER — Telehealth: Payer: Self-pay

## 2015-05-08 NOTE — Telephone Encounter (Signed)
Pt had left a VM on my phone on Friday and I was off that day. Said I needed to call him right away. I returned call and [facility] for a return call.

## 2015-10-05 DIAGNOSIS — E782 Mixed hyperlipidemia: Secondary | ICD-10-CM | POA: Diagnosis not present

## 2015-10-05 DIAGNOSIS — I10 Essential (primary) hypertension: Secondary | ICD-10-CM | POA: Diagnosis not present

## 2015-10-05 DIAGNOSIS — M79643 Pain in unspecified hand: Secondary | ICD-10-CM | POA: Diagnosis not present

## 2015-10-05 DIAGNOSIS — Z6831 Body mass index (BMI) 31.0-31.9, adult: Secondary | ICD-10-CM | POA: Diagnosis not present

## 2016-05-13 DIAGNOSIS — Z1389 Encounter for screening for other disorder: Secondary | ICD-10-CM | POA: Diagnosis not present

## 2016-05-13 DIAGNOSIS — I10 Essential (primary) hypertension: Secondary | ICD-10-CM | POA: Diagnosis not present

## 2016-05-13 DIAGNOSIS — E669 Obesity, unspecified: Secondary | ICD-10-CM | POA: Diagnosis not present

## 2016-05-13 DIAGNOSIS — Z6833 Body mass index (BMI) 33.0-33.9, adult: Secondary | ICD-10-CM | POA: Diagnosis not present

## 2016-05-13 DIAGNOSIS — J069 Acute upper respiratory infection, unspecified: Secondary | ICD-10-CM | POA: Diagnosis not present

## 2016-05-13 DIAGNOSIS — E782 Mixed hyperlipidemia: Secondary | ICD-10-CM | POA: Diagnosis not present

## 2016-05-13 DIAGNOSIS — H6692 Otitis media, unspecified, left ear: Secondary | ICD-10-CM | POA: Diagnosis not present

## 2016-06-03 DIAGNOSIS — J343 Hypertrophy of nasal turbinates: Secondary | ICD-10-CM | POA: Diagnosis not present

## 2016-06-03 DIAGNOSIS — Z1389 Encounter for screening for other disorder: Secondary | ICD-10-CM | POA: Diagnosis not present

## 2016-06-03 DIAGNOSIS — J069 Acute upper respiratory infection, unspecified: Secondary | ICD-10-CM | POA: Diagnosis not present

## 2016-06-03 DIAGNOSIS — J209 Acute bronchitis, unspecified: Secondary | ICD-10-CM | POA: Diagnosis not present

## 2016-06-03 DIAGNOSIS — R070 Pain in throat: Secondary | ICD-10-CM | POA: Diagnosis not present

## 2016-06-03 DIAGNOSIS — Z6833 Body mass index (BMI) 33.0-33.9, adult: Secondary | ICD-10-CM | POA: Diagnosis not present

## 2016-11-18 ENCOUNTER — Ambulatory Visit (INDEPENDENT_AMBULATORY_CARE_PROVIDER_SITE_OTHER): Payer: Self-pay

## 2016-11-18 ENCOUNTER — Ambulatory Visit (INDEPENDENT_AMBULATORY_CARE_PROVIDER_SITE_OTHER): Payer: BLUE CROSS/BLUE SHIELD | Admitting: Orthopaedic Surgery

## 2016-11-18 ENCOUNTER — Encounter (INDEPENDENT_AMBULATORY_CARE_PROVIDER_SITE_OTHER): Payer: Self-pay | Admitting: Orthopaedic Surgery

## 2016-11-18 ENCOUNTER — Ambulatory Visit (INDEPENDENT_AMBULATORY_CARE_PROVIDER_SITE_OTHER): Payer: BLUE CROSS/BLUE SHIELD

## 2016-11-18 DIAGNOSIS — M79641 Pain in right hand: Secondary | ICD-10-CM

## 2016-11-18 DIAGNOSIS — M79642 Pain in left hand: Secondary | ICD-10-CM

## 2016-11-18 MED ORDER — DICLOFENAC SODIUM 1 % TD GEL: 2.0000 g | Freq: Four times a day (QID) | TRANSDERMAL | 5 refills | Status: DC

## 2016-11-18 MED ORDER — MELOXICAM 7.5 MG PO TABS: 15.0000 mg | ORAL_TABLET | Freq: Every day | ORAL | 2 refills | Status: DC | PRN

## 2016-11-18 NOTE — Progress Notes (Signed)
Office Visit Note   Patient: Luke Chavez           Date of Birth: 12-23-1965           MRN: [id number] Visit Date: 11/18/2016              Requested by: Sharilyn Sites, [address] [place], [place] [address] PCP: Sharilyn Sites, MD   Assessment & Plan: Visit Diagnoses:  1. Bilateral hand pain     Plan: Overall impression bilateral hand osteoarthritis. Prescription for full tear in gel and meloxicam. Questions encouraged and answered. Follow-up as needed.  Follow-Up Instructions: Return if symptoms worsen or fail to improve.   Orders:  Orders Placed This Encounter  Procedures  . XR Hand Complete Left  . XR Hand Complete Right   Meds ordered this encounter  Medications  . diclofenac sodium (VOLTAREN) 1 % GEL    Sig: Apply 2 g topically 4 (four) times daily.    Dispense:  1 Tube    Refill:  5  . meloxicam (MOBIC) 7.5 MG tablet    Sig: Take 2 tablets (15 mg total) by mouth daily as needed for pain.    Dispense:  30 tablet    Refill:  2      Procedures: No procedures performed   Clinical Data: No additional findings.   Subjective: Chief Complaint  Patient presents with  . Right Hand - Pain  . Left Hand - Pain    Patient 51 year old gentleman, bilateral hand pain for several years. He works as a Teacher, music. The pain is worse with use and in the cold. He denies any numbness or signs or symptoms of carpal tunnel syndrome. Denies any real injuries. Denies any numbness and tingling.    Review of Systems  Constitutional: Negative.   All other systems reviewed and are negative.    Objective: Vital Signs: There were no vitals taken for this visit.  Physical Exam  Constitutional: He is oriented to person, place, and time. He appears well-developed and well-nourished.  HENT:  Head: Normocephalic and atraumatic.  Eyes: Pupils are equal, round, and reactive to light.  Neck: Neck supple.  Pulmonary/Chest: Effort normal.  Abdominal: Soft.    Musculoskeletal: Normal range of motion.  Neurological: He is alert and oriented to person, place, and time.  Skin: Skin is warm.  Psychiatric: He has a normal mood and affect. His behavior is normal. Judgment and thought content normal.  Nursing note and vitals reviewed.   Ortho Exam Bilateral hand exam is negative for carpal tunnel compressive signs. No evidence of swelling. Negative grind test. Negative Finkelstein's. No triggering. Well-preserved range of motion of MCP and IP joints. Specialty Comments:  No specialty comments available.  Imaging: Xr Hand Complete Left  Result Date: 11/18/2016 Degenerative arthritis of left thumb CMC joint. No significant degenerative changes of the interphalangeal joints.  Xr Hand Complete Right  Result Date: 11/18/2016 Thumb CMC arthritis. No significant degenerative changes of the inner phalangeal joints. Degenerative joint disease of the DRUJ    PMFS History: There are no active problems to display for this patient.  Past Medical History:  Diagnosis Date  . Chronic back pain   . Hypertension     No family history on file.  Past Surgical History:  Procedure Laterality Date  . COLONOSCOPY N/A 04/17/2015   Procedure: COLONOSCOPY;  Surgeon: Danie Binder, MD;  Location: [facility];  Service: Endoscopy;  Laterality: N/A;  1:00 PM  Social History   Occupational History  . Not on file.   Social History Main Topics  . Smoking status: Former Smoker    Types: Cigarettes  . Smokeless tobacco: Never Used  . Alcohol use No  . Drug use: Yes    Types: Cocaine, Marijuana     Comment: former  . Sexual activity: Not on file

## 2016-11-22 ENCOUNTER — Telehealth (INDEPENDENT_AMBULATORY_CARE_PROVIDER_SITE_OTHER): Payer: Self-pay

## 2016-11-22 ENCOUNTER — Telehealth (INDEPENDENT_AMBULATORY_CARE_PROVIDER_SITE_OTHER): Payer: Self-pay | Admitting: Orthopaedic Surgery

## 2016-11-22 NOTE — Telephone Encounter (Signed)
Pt states prior Josem Kaufmann is needed for his Rx Voltran Gel and his insurance states they have not received anything from Dr Erlinda Hong.

## 2016-11-22 NOTE — Telephone Encounter (Signed)
Patient called concerning the status of PA for Voltaren Gel.  Patient stated that he talked with his Insurance.  CB# [phone].  Please advise.  Thank You.

## 2016-11-22 NOTE — Telephone Encounter (Signed)
Try to do PA through [email or web address] but its needing additional info they will fax to [place].  PA Pending

## 2016-11-25 ENCOUNTER — Telehealth (INDEPENDENT_AMBULATORY_CARE_PROVIDER_SITE_OTHER): Payer: Self-pay | Admitting: Radiology

## 2016-11-25 NOTE — Telephone Encounter (Signed)
Called [employer] and spoke to pharmacist and it RX was approved.

## 2016-11-25 NOTE — Telephone Encounter (Signed)
Patient is calling and wants to know the status of the prior auth for his med.  He is getting frustrated since it has been a week now and he has not been able to get his meds.  He says that he has called [employer] and they have not received anything from our office.  Please call him back to advise.

## 2016-11-25 NOTE — Telephone Encounter (Signed)
Called [employer] and spoke to pharmacist and it RX was approved.  Called patient to advise.

## 2017-02-05 ENCOUNTER — Encounter: Payer: Self-pay | Admitting: Orthopaedic Surgery

## 2017-02-05 ENCOUNTER — Ambulatory Visit (INDEPENDENT_AMBULATORY_CARE_PROVIDER_SITE_OTHER): Payer: BLUE CROSS/BLUE SHIELD

## 2017-02-05 ENCOUNTER — Ambulatory Visit: Payer: BLUE CROSS/BLUE SHIELD | Admitting: Orthopaedic Surgery

## 2017-02-05 VITALS — BP 145/94 | HR 72 | Temp 97.1°F | Ht 74.0 in | Wt 254.0 lb

## 2017-02-05 DIAGNOSIS — M25562 Pain in left knee: Secondary | ICD-10-CM | POA: Diagnosis not present

## 2017-02-05 DIAGNOSIS — G8929 Other chronic pain: Secondary | ICD-10-CM

## 2017-02-05 MED ORDER — NAPROXEN 500 MG PO TABS: 500.0000 mg | ORAL_TABLET | Freq: Two times a day (BID) | ORAL | 5 refills | Status: DC

## 2017-02-05 NOTE — Patient Instructions (Signed)

## 2017-02-05 NOTE — Progress Notes (Signed)
Subjective:    Patient ID: Luke Chavez, male    DOB: Nov 21, 1965, 51 y.o.   MRN: [id number]  HPI He has had pain of the left knee getting progressive worse over the last four to five months.  He has no trauma.  He has swelling and popping but no locking or giving way.  He has no numbness or redness.  He has arthritis of the hands and has been on Mobic, Voltaren and other NASAIDs with little help.  He has more medial joint pain.  He has used some rubs on it and that helps the most.  Ice and heat give only slight help.  He is worse at the end of the day.   Review of Systems  Musculoskeletal: Positive for arthralgias, gait problem and joint swelling.  All other systems reviewed and are negative.  Past Medical History:  Diagnosis Date  . Arthritis   . Chronic back pain   . Hypertension     Past Surgical History:  Procedure Laterality Date  . COLONOSCOPY N/A 04/17/2015   Procedure: COLONOSCOPY;  Surgeon: Danie Binder, MD;  Location: [facility];  Service: Endoscopy;  Laterality: N/A;  1:00 PM    Current Outpatient Medications on File Prior to Visit  Medication Sig Dispense Refill  . aspirin 81 MG tablet Take 81 mg by mouth daily.    . diclofenac sodium (VOLTAREN) 1 % GEL Apply 2 g topically 4 (four) times daily. 1 Tube 5  . meloxicam (MOBIC) 7.5 MG tablet Take 2 tablets (15 mg total) by mouth daily as needed for pain. 30 tablet 2  . naproxen sodium (ALEVE) 220 MG tablet Take 220 mg by mouth 2 (two) times daily with a meal.     No current facility-administered medications on file prior to visit.     Social History   Socioeconomic History  . Marital status: Married    Spouse name: Not on file  . Number of children: Not on file  . Years of education: Not on file  . Highest education level: Not on file  Social Needs  . Financial resource strain: Not on file  . Food insecurity - worry: Not on file  . Food insecurity - inability: Not on file  . Transportation needs -  medical: Not on file  . Transportation needs - non-medical: Not on file  Occupational History  . Not on file  Tobacco Use  . Smoking status: Former Smoker    Types: Cigarettes  . Smokeless tobacco: Never Used  Substance and Sexual Activity  . Alcohol use: No  . Drug use: Yes    Types: Cocaine, Marijuana    Comment: former  . Sexual activity: Not on file  Other Topics Concern  . Not on file  Social History Narrative  . Not on file    Family History  Problem Relation Age of Onset  . Cancer Father   . Arthritis Father   . Arthritis Maternal Aunt   . Arthritis Maternal Uncle   . Arthritis Paternal Aunt     BP (!) 145/94   Pulse 72   Temp (!) 97.1 F (36.2 C)   Ht 6\' 2"  (1.88 m)   Wt 254 lb (115.2 kg)   BMI 32.61 kg/m      Objective:   Physical Exam  Constitutional: He is oriented to person, place, and time. He appears well-developed and well-nourished.  HENT:  Head: Normocephalic and atraumatic.  Eyes: Conjunctivae and EOM are normal.  Pupils are equal, round, and reactive to light.  Neck: Normal range of motion. Neck supple.  Cardiovascular: Normal rate, regular rhythm and intact distal pulses.  Pulmonary/Chest: Effort normal.  Abdominal: Soft.  Musculoskeletal: He exhibits tenderness (Painful left knee, slight effusion, medial joint line tenderness, ROM 0 to 115, slight limp left, NV intact, stable, right knee negative.).  Neurological: He is alert and oriented to person, place, and time. He has normal reflexes. He displays normal reflexes. No cranial nerve deficit. He exhibits normal muscle tone. Coordination normal.  Skin: Skin is warm and dry.  Psychiatric: He has a normal mood and affect. His behavior is normal. Judgment and thought content normal.  Vitals reviewed.   X-rays were done of the left knee, reported separately.      Assessment & Plan:   Encounter Diagnosis  Name Primary?  . Chronic pain of left knee Yes   I am concerned about a medial  meniscus tear. He has medial pain and also X-rays showing narrowing medially.  PROCEDURE NOTE:  The patient requests injections of the left knee , verbal consent was obtained.  The left knee was prepped appropriately after time out was performed.   Sterile technique was observed and injection of 1 cc of Depo-Medrol 40 mg with several cc's of plain xylocaine. Anesthesia was provided by ethyl chloride and a 20-gauge needle was used to inject the knee area. The injection was tolerated well.  A band aid dressing was applied.  The patient was advised to apply ice later today and tomorrow to the injection sight as needed.  I will begin Naprosyn 500 po bid pc, precautions discussed.  Return in one month. Exercises given, documentation printed out for patient.  Call if any problem.  Precautions discussed.   Electronically Signed Sanjuana Kava, MD 12/5/20189:08 AM

## 2017-03-02 ENCOUNTER — Other Ambulatory Visit (INDEPENDENT_AMBULATORY_CARE_PROVIDER_SITE_OTHER): Payer: Self-pay | Admitting: Orthopaedic Surgery

## 2017-03-06 ENCOUNTER — Ambulatory Visit: Payer: BLUE CROSS/BLUE SHIELD | Admitting: Orthopaedic Surgery

## 2017-03-06 ENCOUNTER — Encounter: Payer: Self-pay | Admitting: Orthopaedic Surgery

## 2017-03-06 VITALS — BP 143/93 | HR 73 | Temp 99.5°F | Ht 74.0 in | Wt 264.0 lb

## 2017-03-06 DIAGNOSIS — M25562 Pain in left knee: Secondary | ICD-10-CM | POA: Diagnosis not present

## 2017-03-06 DIAGNOSIS — G8929 Other chronic pain: Secondary | ICD-10-CM

## 2017-03-06 NOTE — Progress Notes (Signed)
Patient Luke Chavez, male DOB:[birth year], 52 y.o. PNT:[id number]  Chief Complaint  Patient presents with  . Knee Pain    LEFT    HPI  Luke Chavez is a 52 y.o. male who has continued pain and swelling and giving way of the left knee.  He is in pain.  The injection last time did not help.  His insurance requires a trial of PT before approving a MRI.  I will set him up for PT. HPI  Body mass index is 33.9 kg/m.  ROS  Review of Systems  Musculoskeletal: Positive for arthralgias, gait problem and joint swelling.  All other systems reviewed and are negative.   Past Medical History:  Diagnosis Date  . Arthritis   . Chronic back pain   . Hypertension     Past Surgical History:  Procedure Laterality Date  . COLONOSCOPY N/A 04/17/2015   Procedure: COLONOSCOPY;  Surgeon: Luke Binder, MD;  [facility];  Service: Endoscopy;  Laterality: N/A;  1:00 PM    Family History  Problem Relation Age of Onset  . Cancer Father   . Arthritis Father   . Arthritis Maternal Aunt   . Arthritis Maternal Uncle   . Arthritis Paternal Aunt     Social History Social History   Tobacco Use  . Smoking status: Former Smoker    Types: Cigarettes  . Smokeless tobacco: Never Used  Substance Use Topics  . Alcohol use: No  . Drug use: Yes    Types: Cocaine, Marijuana    Comment: former    No Known Allergies  Current Outpatient Medications  Medication Sig Dispense Refill  . aspirin 81 MG tablet Take 81 mg by mouth daily.    . diclofenac sodium (VOLTAREN) 1 % GEL Apply 2 g topically 4 (four) times daily. 1 Tube 5  . meloxicam (MOBIC) 7.5 MG tablet TAKE 2 TABLETS(15 MG) BY MOUTH DAILY AS NEEDED FOR PAIN 30 tablet 0  . naproxen (NAPROSYN) 500 MG tablet Take 1 tablet (500 mg total) by mouth 2 (two) times daily with a meal. 60 tablet 5  . naproxen sodium (ALEVE) 220 MG tablet Take 220 mg by mouth 2 (two) times daily with a meal.     No current facility-administered  medications for this visit.      Physical Exam  Blood pressure (!) 143/93, pulse 73, temperature 99.5 F (37.5 C), height 6\' 2"  (1.88 m), weight 264 lb (119.7 kg).  Constitutional: overall normal hygiene, normal nutrition, well developed, normal grooming, normal body habitus. Assistive device:none  Musculoskeletal: gait and station Limp left, muscle tone and strength are normal, no tremors or atrophy is present.  .  Neurological: coordination overall normal.  Deep tendon reflex/nerve stretch intact.  Sensation normal.  Cranial nerves II-XII intact.   Skin:   Normal overall no scars, lesions, ulcers or rashes. No psoriasis.  Psychiatric: Alert and oriented x 3.  Recent memory intact, remote memory unclear.  Normal mood and affect. Well groomed.  Good eye contact.  Cardiovascular: overall no swelling, no varicosities, no edema bilaterally, normal temperatures of the legs and arms, no clubbing, cyanosis and good capillary refill.  Lymphatic: palpation is normal.  All other systems reviewed and are negative   The left lower extremity is examined:  Inspection:  Thigh:  Non-tender and no defects  Knee has swelling 1+ effusion.  Joint tenderness is present                        Patient is tender over the medial joint line  Lower Leg:  Has normal appearance and no tenderness or defects  Ankle:  Non-tender and no defects  Foot:  Non-tender and no defects Range of Motion:  Knee:  Range of motion is: 0-110                        Crepitus is  present  Ankle:  Range of motion is normal. Strength and Tone:  The left lower extremity has normal strength and tone. Stability:  Knee:  The knee has positive medial McMurray  Ankle:  The ankle is stable.   The patient has been educated about the nature of the problem(s) and counseled on treatment options.  The patient appeared to understand what I have discussed and is in agreement with it.  Encounter Diagnosis   Name Primary?  . Chronic pain of left knee Yes    PLAN Call if any problems.  Precautions discussed.  Continue current medications.   Return to clinic 2 weeks   Begin PT.  Electronically Signed Sanjuana Kava, MD 1/3/20199:20 AM

## 2017-03-06 NOTE — Addendum Note (Signed)
Addended by: Glory Buff on: 03/06/2017 09:31 AM   Modules accepted: Orders

## 2017-03-06 NOTE — Patient Instructions (Signed)

## 2017-03-10 ENCOUNTER — Ambulatory Visit (HOSPITAL_COMMUNITY): Payer: BLUE CROSS/BLUE SHIELD | Attending: Orthopaedic Surgery

## 2017-03-10 DIAGNOSIS — M25562 Pain in left knee: Secondary | ICD-10-CM | POA: Diagnosis not present

## 2017-03-10 DIAGNOSIS — M25662 Stiffness of left knee, not elsewhere classified: Secondary | ICD-10-CM | POA: Insufficient documentation

## 2017-03-10 DIAGNOSIS — M6281 Muscle weakness (generalized): Secondary | ICD-10-CM | POA: Diagnosis not present

## 2017-03-10 NOTE — Therapy (Signed)
[place], [place], [address] Phone: [phone]   Fax:  [phone]  Physical Therapy Evaluation  Patient Details  Name: Luke Chavez MRN: [id number] Date of Birth: 1965/05/26 Referring Provider: [name]   Encounter Date: 03/10/2017  PT End of Session - 03/10/17 1006    Visit Number  1    Number of Visits  16    Date for PT Re-Evaluation  03/24/17    Authorization Type  BCBS other    Authorization Time Period  03/24/17-05/22/17    PT Start Time  0850    PT Stop Time  0957    PT Time Calculation (min)  67 min    Activity Tolerance  Patient tolerated treatment well;No increased pain;Patient limited by pain    Behavior During Therapy  [facility] for tasks assessed/performed       Past Medical History:  Diagnosis Date  . Arthritis   . Chronic back pain   . Hypertension     Past Surgical History:  Procedure Laterality Date  . COLONOSCOPY N/A 04/17/2015   Procedure: COLONOSCOPY;  Surgeon: Danie Binder, MD;  [facility];  Service: Endoscopy;  Laterality: N/A;  1:00 PM    There were no vitals filed for this visit.   Subjective Assessment - 03/10/17 0901    Subjective  Patient reports working a job about 6-8 weeks that included lots of stairs performance and noted gradual progression of left knee pain. Pain is in the Right anterior knee just medial of the patella. Pain is worse after working long days, especially after workday. Pain is described as achy. He goes up/down ladders, stairs, and kneeling at work, all of which aggravate pain. Pt notes some mild swelling per the MD but he has not noted any himself.        How long can you sit comfortably?  Pt able to tolerate only 10-15 minutes in car before onset of knee pain.     How long can you stand comfortably?  Can tolerate up to 4 hours prior to onset     How long can you walk comfortably?  not limited to his current level of activity     Diagnostic  tests  Medial narrowing only.     Patient Stated Goals  Decrease pain at work.     Currently in Pain?  Yes    Pain Score  3     Pain Location  Knee    Pain Orientation  Left;Anterior;Medial    Pain Descriptors / Indicators  Aching    Pain Type  Acute pain    Pain Onset  More than a month ago    Pain Frequency  Constant    Aggravating Factors   prolonged sitting, stairs, kneeling, squating     Pain Relieving Factors  moving his knee, positionaal changes, aleve, rest; not tried heat or ice    Effect of Pain on Daily Activities  he suffers through it     Multiple Pain Sites  Yes         [facility] PT Assessment - 03/10/17 0001      Assessment   Medical Diagnosis  Left knee pain     Referring Provider  [place]    Onset Date/Surgical Date  -- November 2018    Hand Dominance  Right    Next MD Visit  mid January 2019      Precautions   Precautions  None  Required Braces or Orthoses  Other Brace/Splint wearing a lef tknee brace of his own volition      Restrictions   Weight Bearing Restrictions  No      Balance Screen   Has the patient fallen in the past 6 months  No    Has the patient had a decrease in activity level because of a fear of falling?   No    Is the patient reluctant to leave their home because of a fear of falling?   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      Observation/Other Assessments   Focus on [place] [facility]   FOTO: 67 (33% impaired)       Sensation   Light Touch  Appears Intact      Functional Tests   Functional tests  Other no antalgia; avoidance of quads loading descending      Other:   Other/ Comments  Stairs:      ROM / Strength   AROM / PROM / Strength  Strength;PROM      PROM   PROM Assessment Site  Knee    Right/Left Knee  Left    Left Knee Extension  14 pain at end feel     Left Knee  Flexion  115 pain at end range      Strength   Strength Assessment Site  Hip;Knee    Right/Left Hip  Right;Left    Right Hip Flexion  5/5    Right Hip External Rotation   4+/5    Right Hip Internal Rotation  5/5    Right Hip ABduction  4/5 horizontal abdct: weak 5/5    Right Hip ADduction  5/5 pain free    Left Hip Flexion  5/5 weaker tha right)    Left Hip External Rotation  5/5    Left Hip Internal Rotation  5/5    Left Hip ABduction  4/5 horizontal abdct: weak 5/5    Left Hip ADduction  5/5 pain free    Right/Left Knee  Right;Left    Right Knee Flexion  5/5    Right Knee Extension  5/5    Left Knee Flexion  5/5 (weak 5/5)     Left Knee Extension  5/5      Special Tests    Special Tests  Knee Special Tests;Meniscus Tests    Knee Special tests   McConnell Test;other    Meniscus Tests  McMurray Test;Apley's Compression;Apley's Distraction      McConnell Test   Findings  --    Side  --      other    Findings  Positive    Side   Left    Comments  valgus stress test at 0 & 30 degrees:  (+) for medial knee pain      McMurray Test   Findings  Positive    Side  Left      Apley's Compression   Findings  Not tested    Comments  -- contraindicated as painful with distraction      Apley's Distraction   Findings  Negative      Transfers   Comments  30sec chair rise: 10x      Balance   Balance Assessed  Yes      Static Standing Balance  Static Standing - Balance Support  No upper extremity supported SLS: Rt: 42s, Lt: 14s c loss of hip strategy             Objective measurements completed on examination: See above findings.      [place] Adult PT Treatment/Exercise - 03/10/17 0001      Exercises   Exercises  Knee/Hip      Knee/Hip Exercises: Standing   Heel Raises  Both;1 set;20 reps HEP education    SLS  SLS + maximal hip flexion: 20x alternating x 3secH glute med activation, hip flexor strengthening HEP education      Knee/Hip Exercises: Seated   Long  Arc Quad  1 set;15 reps;Left;AROM 15x3secH (to improve quads activation, decrease pain);HEP ed    Heel Slides  20 reps;Left;AROM 1sec hold felxion/extension: HEP education      Knee/Hip Exercises: Supine   Bridges with Clamshell  1 set;15 reps;Both;AROM;Strengthening GreenTB; HEP education             PT Education - 03/10/17 1005    Education provided  Yes    Education Details  importance of pain/edema management early in program, then isolated strengthening to improve functional movement quality/tolerance.     Person(s) Educated  Patient    Methods  Explanation    Comprehension  Need further instruction       PT Short Term Goals - 03/10/17 1016      PT SHORT TERM GOAL #1   Title  After 4 weeks patient will report improved tolerance to sitting car by 10 minutes of more prior to aggravation of pain.     Baseline  at eval tolerates 10 minutes prior to exacerbation     Status  New    Target Date  03/31/17      PT SHORT TERM GOAL #2   Title  After 2 weeks patient will demonstrate correct performance of HEP reporting good compliance.     Status  New    Target Date  03/24/17      PT SHORT TERM GOAL #3   Title  After 4 weeks, patient will demonstrate improved MMT testing, 5/5 in all groups, 4+/5 in glute med abduction, and 30-sec chair rise>14x.     Status  New    Target Date  03/31/17        PT Long Term Goals - 03/10/17 1018      PT LONG TERM GOAL #1   Title  After 8 weeks patient will report improved decreased pain at end of workday, less than 3/10.     Status  New    Target Date  04/28/17      PT LONG TERM GOAL #2   Title  After 8 weeks patient will demonstrate 30sec chair rise>18x to demonstrate improve strength for ladders at work.     Status  New    Target Date  04/28/17      PT LONG TERM GOAL #3   Title  After 8 weeks pt will demonstrate single leg squat on 6" step 10 times with godo control of knee mechanics and <2/10 pain.     Status  New    Target Date   04/28/17      PT LONG TERM GOAL #4   Title  After 8 weeks patienmt will demonstrate correect form adn complaince for advanced HEP for DC to address remaining glute med/quads strength deficits.     Status  New    Target Date  04/28/17             Plan - 03/10/17 1007    Clinical Impression Statement  Pt complaining o fsubacute Left knee pain after increased performance of stairs at work. Pt denies lock or buckling, worse pain with sustained flexion, and mild exacerbation only with standing walking. Examination reveals generalized mild Left knee joint edema with end range pain in flexion and extension, ROM limitations in joint (14-115 degrees), sporatic strength deficits in the hips and knees, and altered movement patterns d/t chonic left quads weakness and pain. Special testing is limited d/t generalize pain in subactute state, however consisten pain with valgus stresses in the knee. Please see full examination for more detail. Pt will benefit from skilled PT intervention to address pain and localized edema, improve focal strength impairment, and restore quality/tolerance of functional movements.     History and Personal Factors relevant to plan of care:  Works 48-50hr weeks, with significant aggravation potential, high likelihood of mildly longer timeframe to resolution.     Clinical Presentation  Stable    Clinical Presentation due to:  symptoms response, tests adn measures.     Clinical Decision Making  Moderate    Rehab Potential  Good    PT Frequency  2x / week    PT Duration  8 weeks    PT Treatment/Interventions  Balance training;Electrical Stimulation;Moist Heat;Gait training;Functional mobility training;Therapeutic activities;Therapeutic exercise;Manual techniques;Patient/family education;Visual/perceptual remediation/compensation;Passive range of motion;Dry needling    PT Next Visit Plan  Review HEP in full; TENS to quads x15 minutes concurrent with SLR, SAQ. Progress isolated  open-chain glute med strength.     PT Home Exercise Plan  At eval: band bridge, LAQ15x3secH, heel raises, seated heel slides, High knee marching isometrics;    Consulted and Agree with Plan of Care  Patient       Patient will benefit from skilled therapeutic intervention in order to improve the following deficits and impairments:  Abnormal gait, Hypomobility, Increased edema, Decreased activity tolerance, Decreased strength, Pain, Decreased balance, Improper body mechanics, Decreased mobility, Decreased range of motion  Visit Diagnosis: Acute pain of left knee - Plan: PT plan of care cert/re-cert  Stiffness of left knee, not elsewhere classified - Plan: PT plan of care cert/re-cert  Muscle weakness (generalized) - Plan: PT plan of care cert/re-cert     Problem List There are no active problems to display for this patient.  10:24 AM, 03/10/17 Etta Grandchild, PT, DPT Physical Therapist at [facility] [phone] (office)      Etta Grandchild 03/10/2017, 10:24 AM  [place], [place], [address] Phone: [phone]   Fax:  [phone]  Name: Luke Chavez MRN: [id number] Date of Birth: 1965/05/05

## 2017-03-12 ENCOUNTER — Ambulatory Visit (HOSPITAL_COMMUNITY): Payer: BLUE CROSS/BLUE SHIELD

## 2017-03-12 DIAGNOSIS — M6281 Muscle weakness (generalized): Secondary | ICD-10-CM

## 2017-03-12 DIAGNOSIS — M25562 Pain in left knee: Secondary | ICD-10-CM | POA: Diagnosis not present

## 2017-03-12 DIAGNOSIS — M25662 Stiffness of left knee, not elsewhere classified: Secondary | ICD-10-CM | POA: Diagnosis not present

## 2017-03-12 NOTE — Therapy (Addendum)
[place], [place], [address] Phone: [phone]   Fax:  [phone]  Physical Therapy Treatment  Patient Details  Name: Luke Chavez MRN: [id number] Date of Birth: 06-Mar-1965 Referring Provider: [name]   Encounter Date: 03/12/2017  PT End of Session - 03/12/17 0934    Visit Number  2    Number of Visits  16    Date for PT Re-Evaluation  03/24/17    Authorization Type  BCBS other    Authorization Time Period  03/24/17-05/22/17    PT Start Time  0903    PT Stop Time  0943    PT Time Calculation (min)  40 min    Activity Tolerance  Patient tolerated treatment well;No increased pain    Behavior During Therapy  WFL for tasks assessed/performed       Past Medical History:  Diagnosis Date  . Arthritis   . Chronic back pain   . Hypertension     Past Surgical History:  Procedure Laterality Date  . COLONOSCOPY N/A 04/17/2015   Procedure: COLONOSCOPY;  Surgeon: Danie Binder, MD;  [facility];  Service: Endoscopy;  Laterality: N/A;  1:00 PM    There were no vitals filed for this visit.  Subjective Assessment - 03/12/17 0906    Subjective  Pt reports doign well today after firs tvisit. Has tried to work on his HEP thus far, adn reports none of it to be aggravating.     Currently in Pain?  Yes    Pain Score  3     Pain Location  Knee    Pain Orientation  Left         [facility] PT Assessment - 03/12/17 0001      PROM   Left Knee Flexion  119                  [facility] Adult PT Treatment/Exercise - 03/12/17 0001      Knee/Hip Exercises: Seated   Long Arc Quad  1 set;Left;AROM;10 reps 10x3secH (to improve quads activation, decrease pain);HEP     Heel Slides  Left;AROM;10 reps 1sec hold felxion/extension: HEP education      Knee/Hip Exercises: Supine   Quad Sets  10 reps;Left 10x3sec    Short Arc Quad Sets  Left;1 set;15 reps c sensory TENS, 5lb ankle weightr    Heel Slides   Left;AROM;10 reps 10x3sec    Bridges with Clamshell  1 set;Both;AROM;Strengthening;10 reps GreenTB; HEP review    Straight Leg Raises  Left;1 set;10 reps c sensory TENS    Knee Flexion  PROM 119 degrees      Modalities   Modalities  Electrical Stimulation      Electrical Stimulation   Electrical Stimulation Location  Left VMO    Electrical Stimulation Action  sensory TENS concurrent with SAQ/SLR: 7 minutes    Electrical Stimulation Parameters  modulation    Electrical Stimulation Goals  Neuromuscular facilitation improve VMO proprioception, decrease quads lag      Manual Therapy   Manual Therapy  Joint mobilization;Myofascial release    Joint Mobilization  seated knee distraction at 90* 10lbs fo r10 minutes    Myofascial Release  Trigger point release, Vastus Lateralis: 12 minutes would benefit from dry needling; significant taut bands/pain             PT Education - 03/12/17 0934    Education provided  Yes    Education Details  about  dry needling for future visits    Person(s) Educated  Patient    Methods  Explanation    Comprehension  Need further instruction       PT Short Term Goals - 03/12/17 1107      PT SHORT TERM GOAL #1   Title  After 4 weeks patient will report improved tolerance to sitting car by 10 minutes of more prior to aggravation of pain.     Baseline  at eval tolerates 10 minutes prior to exacerbation     Status  On-going    Target Date  04/07/17      PT SHORT TERM GOAL #2   Title  After 2 weeks patient will demonstrate correct performance of HEP reporting good compliance.     Status  On-going    Target Date  03/24/17      PT SHORT TERM GOAL #3   Title  After 4 weeks, patient will demonstrate improved MMT testing, 5/5 in all groups, 4+/5 in glute med abduction, and 30-sec chair rise>14x.     Status  On-going    Target Date  04/07/17        PT Long Term Goals - 03/12/17 1107      PT LONG TERM GOAL #1   Title  After 8 weeks patient will  report improved decreased pain at end of workday, less than 3/10.     Status  On-going    Target Date  05/05/17      PT LONG TERM GOAL #2   Title  After 8 weeks patient will demonstrate 30sec chair rise>18x to demonstrate improve strength for ladders at work.     Status  On-going    Target Date  05/05/17      PT LONG TERM GOAL #3   Title  After 8 weeks pt will demonstrate single leg squat on 6" step 10 times with godo control of knee mechanics and <2/10 pain.     Status  On-going    Target Date  05/05/17      PT LONG TERM GOAL #4   Title  After 8 weeks patienmt will demonstrate correect form and complaince for advanced HEP for DC to address remaining glute med/quads strength deficits.     Status  On-going    Target Date  05/05/17            Plan - 03/12/17 4259    Clinical Impression Statement  Reviewed evaluation and treatment goals. Deep tissue assessment reveals Left glute med/min WNL, but substantial taut bands and trigger piints with pain in th eleft vastus lateralis. HEP is review in full with good form demonstrated. MFR to quads performed. Education on dry needling for future visits. Introduced sensory TENS in conjunction with focal quads sstrength this session. Pt making good progress thus far. Flexion ROM is already improved by 4 degrees.    Rehab Potential  Good    PT Frequency  2x / week    PT Duration  8 weeks    PT Treatment/Interventions  Balance training;Electrical Stimulation;Moist Heat;Gait training;Functional mobility training;Therapeutic activities;Therapeutic exercise;Manual techniques;Patient/family education;Visual/perceptual remediation/compensation;Passive range of motion;Dry needling    PT Next Visit Plan  Add in lateral band walks. Continue with quads and glute med strength, MFR to vastus lateralis, TENS with SAQ/SLR.     PT Home Exercise Plan  At eval: band bridge, LAQ15x3secH, heel raises, seated heel slides, High knee marching isometrics;    Consulted  and Agree with Plan of Care  Patient       Patient will benefit from skilled therapeutic intervention in order to improve the following deficits and impairments:  Abnormal gait, Hypomobility, Increased edema, Decreased activity tolerance, Decreased strength, Pain, Decreased balance, Improper body mechanics, Decreased mobility, Decreased range of motion  Visit Diagnosis: Acute pain of left knee  Stiffness of left knee, not elsewhere classified  Muscle weakness (generalized)     Problem List There are no active problems to display for this patient.  9:48 AM, 03/12/17 Etta Grandchild, PT, DPT Physical Therapist at [facility] [phone] (office)      Etta Grandchild 03/12/2017, 9:48 AM  [place] [address] [place], [place], [address] Phone: [phone]   Fax:  [phone]  Name: Luke Chavez MRN: [id number] Date of Birth: [birth year]

## 2017-03-18 ENCOUNTER — Ambulatory Visit (HOSPITAL_COMMUNITY): Payer: BLUE CROSS/BLUE SHIELD | Admitting: Physical Therapy

## 2017-03-18 DIAGNOSIS — M25562 Pain in left knee: Secondary | ICD-10-CM | POA: Diagnosis not present

## 2017-03-18 DIAGNOSIS — M25662 Stiffness of left knee, not elsewhere classified: Secondary | ICD-10-CM | POA: Diagnosis not present

## 2017-03-18 DIAGNOSIS — M6281 Muscle weakness (generalized): Secondary | ICD-10-CM | POA: Diagnosis not present

## 2017-03-18 NOTE — Therapy (Signed)
[place], [place], [address] Phone: [phone]   Fax:  [phone]  Physical Therapy Treatment  Patient Details  Name: Luke Chavez MRN: [id number] Date of Birth: Jan 09, 1966 Referring Provider: [name]   Encounter Date: 03/18/2017  PT End of Session - 03/18/17 0917    Visit Number  3    Number of Visits  16    Date for PT Re-Evaluation  03/24/17    Authorization Type  BCBS other    Authorization Time Period  03/24/17-05/22/17    PT Start Time  0817    PT Stop Time  0855    PT Time Calculation (min)  38 min    Activity Tolerance  Patient tolerated treatment well;No increased pain    Behavior During Therapy  WFL for tasks assessed/performed       Past Medical History:  Diagnosis Date  . Arthritis   . Chronic back pain   . Hypertension     Past Surgical History:  Procedure Laterality Date  . COLONOSCOPY N/A 04/17/2015   Procedure: COLONOSCOPY;  Surgeon: Danie Binder, MD;  [facility];  Service: Endoscopy;  Laterality: N/A;  1:00 PM    There were no vitals filed for this visit.  Subjective Assessment - 03/18/17 0822    Subjective  Pt states he did not have power over the weekend and didn't do alot of his exercises because of this.  States he currently has 2/10 pain in his Lt knee.    Currently in Pain?  Yes    Pain Score  2     Pain Location  Knee    Pain Orientation  Left    Pain Descriptors / Indicators  Aching    Pain Type  Acute pain    Pain Frequency  Constant                      [facility] Adult PT Treatment/Exercise - 03/18/17 0001      Knee/Hip Exercises: Stretches   Active Hamstring Stretch  Left;3 reps;30 seconds;Limitations    Active Hamstring Stretch Limitations  12" box    Knee: Self-Stretch to increase Flexion  Left;10 seconds;Limitations    Knee: Self-Stretch Limitations  10 reps on 12" box      Knee/Hip Exercises: Standing   SLS  4 trials max 18" on  Lt    SLS with Vectors  standing on Lt 10X3" holds 1 HHA      Knee/Hip Exercises: Seated   Long Arc Quad  Left;15 reps      Knee/Hip Exercises: Supine   Quad Sets  10 reps;Left    Short Arc Quad Sets  Left;1 set;15 reps;Limitations    Short Arc Quad Sets Limitations  4# weight, 3" holds    Straight Leg Raises  Left;1 set;10 reps;Limitations    Straight Leg Raises Limitations  4#weight    Knee Extension  AROM;Limitations    Knee Extension Limitations  lacking 4 degrees; Rt lacking 2 degrees    Knee Flexion  AROM;Limitations    Knee Flexion Limitations  125 degrees and equal to RT LE      Knee/Hip Exercises: Sidelying   Hip ABduction  Left;15 reps;Limitations    Hip ABduction Limitations  4# weight      Manual Therapy   Manual Therapy  Myofascial release;Soft tissue mobilization    Manual therapy comments  completed seperately from all other skilled interventions, at EOS  Soft tissue mobilization  to decrease tightness/improve mobilty perimeter of knee, hamstring and ITB    Myofascial Release  to decrease spasms/adhesions ITB and hamstring               PT Short Term Goals - 03/12/17 1107      PT SHORT TERM GOAL #1   Title  After 4 weeks patient will report improved tolerance to sitting car by 10 minutes of more prior to aggravation of pain.     Baseline  at eval tolerates 10 minutes prior to exacerbation     Status  On-going    Target Date  04/07/17      PT SHORT TERM GOAL #2   Title  After 2 weeks patient will demonstrate correct performance of HEP reporting good compliance.     Status  On-going    Target Date  03/24/17      PT SHORT TERM GOAL #3   Title  After 4 weeks, patient will demonstrate improved MMT testing, 5/5 in all groups, 4+/5 in glute med abduction, and 30-sec chair rise>14x.     Status  On-going    Target Date  04/07/17        PT Long Term Goals - 03/12/17 1107      PT LONG TERM GOAL #1   Title  After 8 weeks patient will report improved  decreased pain at end of workday, less than 3/10.     Status  On-going    Target Date  05/05/17      PT LONG TERM GOAL #2   Title  After 8 weeks patient will demonstrate 30sec chair rise>18x to demonstrate improve strength for ladders at work.     Status  On-going    Target Date  05/05/17      PT LONG TERM GOAL #3   Title  After 8 weeks pt will demonstrate single leg squat on 6" step 10 times with godo control of knee mechanics and <2/10 pain.     Status  On-going    Target Date  05/05/17      PT LONG TERM GOAL #4   Title  After 8 weeks patienmt will demonstrate correect form and complaince for advanced HEP for DC to address remaining glute med/quads strength deficits.     Status  On-going    Target Date  05/05/17            Plan - 03/18/17 3419    Clinical Impression Statement  Began session with quad and hamstring streches prior to advancing to strengthening and manual.  Noted with no longer a need for TENS as has good AROM and no pain voiced with movement.   Added 4# weight to quad and hip strengthening this session as ROM now [facility] and equal to opposing side.  Pt exhibits good quad contraction this session.  Completed manual to, including myofascial techniques to tight ITB and hamstrings.  Pt reported significant improvement at EOS and no noted gait dystfunction when leaving at EOS.     Rehab Potential  Good    PT Frequency  2x / week    PT Duration  8 weeks    PT Treatment/Interventions  Balance training;Electrical Stimulation;Moist Heat;Gait training;Functional mobility training;Therapeutic activities;Therapeutic exercise;Manual techniques;Patient/family education;Visual/perceptual remediation/compensation;Passive range of motion;Dry needling    PT Next Visit Plan  Next session add in lateral band walks. Continue with quads and glute med strength, MFR/manual PRN.    PT Home Exercise Plan  At eval: band  bridge, LAQ15x3secH, heel raises, seated heel slides, High knee marching  isometrics;    Consulted and Agree with Plan of Care  Patient       Patient will benefit from skilled therapeutic intervention in order to improve the following deficits and impairments:  Abnormal gait, Hypomobility, Increased edema, Decreased activity tolerance, Decreased strength, Pain, Decreased balance, Improper body mechanics, Decreased mobility, Decreased range of motion  Visit Diagnosis: Acute pain of left knee  Stiffness of left knee, not elsewhere classified  Muscle weakness (generalized)     Problem List There are no active problems to display for this patient.  Teena Irani, PTA/CLT [phone]  Teena Irani 03/18/2017, 9:25 AM  [place], [place], [address] Phone: [phone]   Fax:  [phone]  Name: Luke Chavez MRN: [id number] Date of Birth: 22-May-1965

## 2017-03-20 ENCOUNTER — Encounter: Payer: Self-pay | Admitting: Orthopaedic Surgery

## 2017-03-20 ENCOUNTER — Ambulatory Visit: Payer: BLUE CROSS/BLUE SHIELD | Admitting: Orthopaedic Surgery

## 2017-03-20 ENCOUNTER — Ambulatory Visit (HOSPITAL_COMMUNITY): Payer: BLUE CROSS/BLUE SHIELD | Admitting: Physical Therapy

## 2017-03-20 VITALS — BP 152/85 | HR 74 | Ht 74.0 in | Wt 265.0 lb

## 2017-03-20 DIAGNOSIS — M25662 Stiffness of left knee, not elsewhere classified: Secondary | ICD-10-CM

## 2017-03-20 DIAGNOSIS — M6281 Muscle weakness (generalized): Secondary | ICD-10-CM

## 2017-03-20 DIAGNOSIS — M25562 Pain in left knee: Secondary | ICD-10-CM | POA: Diagnosis not present

## 2017-03-20 DIAGNOSIS — G8929 Other chronic pain: Secondary | ICD-10-CM

## 2017-03-20 NOTE — Therapy (Signed)
[place], [place], [address] Phone: [phone]   Fax:  [phone]  Physical Therapy Treatment  Patient Details  Name: Luke Chavez MRN: [id number] Date of Birth: October 01, 1965 Referring Provider: [name]   Encounter Date: 03/20/2017  PT End of Session - 03/20/17 1034    Visit Number  4    Number of Visits  16    Date for PT Re-Evaluation  03/24/17    Authorization Type  BCBS other    Authorization Time Period  03/24/17-05/22/17    PT Start Time  0949    PT Stop Time  1028    PT Time Calculation (min)  39 min    Activity Tolerance  Patient tolerated treatment well;No increased pain    Behavior During Therapy  WFL for tasks assessed/performed       Past Medical History:  Diagnosis Date  . Arthritis   . Chronic back pain   . Hypertension     Past Surgical History:  Procedure Laterality Date  . COLONOSCOPY N/A 04/17/2015   Procedure: COLONOSCOPY;  Surgeon: Danie Binder, MD;  [facility];  Service: Endoscopy;  Laterality: N/A;  1:00 PM    There were no vitals filed for this visit.  Subjective Assessment - 03/20/17 0954    Subjective  Pt states he went back to the MD this morning and he has scheduled him an MRI to rule out possible medial meniscus tear.  Pt with increased antalgia today and reports of pain of 4/10 in Lt knee (was 2/10 last session).    Currently in Pain?  Yes    Pain Score  4     Pain Location  Knee    Pain Orientation  Left    Pain Descriptors / Indicators  Aching    Pain Type  Acute pain    Pain Frequency  Constant                      [facility] Adult PT Treatment/Exercise - 03/20/17 0001      Knee/Hip Exercises: Stretches   Active Hamstring Stretch  Left;3 reps;30 seconds;Limitations    Active Hamstring Stretch Limitations  12" box    Knee: Self-Stretch to increase Flexion  Left;10 seconds;Limitations    Knee: Self-Stretch Limitations  10 reps on 12"  box      Knee/Hip Exercises: Standing   SLS  30" hold    SLS with Vectors  standing on Lt 10X3" holds 1 HHA    Other Standing Knee Exercises  sidestepping with GTB 2RT      Knee/Hip Exercises: Seated   Long Arc Quad  Left;15 reps;Limitations    Long Arc Quad Limitations  4# weight      Knee/Hip Exercises: Supine   Quad Sets  10 reps;Left    Short Arc Quad Sets  Left;1 set;15 reps;Limitations    Short Arc Quad Sets Limitations  4# weight 3" holds    Straight Leg Raises  Left;1 set;Limitations;15 reps    Straight Leg Raises Limitations  4#weight    Knee Extension  AROM;Limitations    Knee Extension Limitations  lacking 2 degrees; Rt lacking 2 degrees      Knee/Hip Exercises: Sidelying   Hip ABduction  Left;15 reps;Limitations    Hip ABduction Limitations  4# weight      Knee/Hip Exercises: Prone   Hamstring Chavez  10 reps      Manual Therapy  Manual Therapy  Myofascial release;Soft tissue mobilization    Manual therapy comments  completed seperately from all other skilled interventions, at EOS    Soft tissue mobilization  to decrease tightness/improve mobilty perimeter of knee, hamstring and ITB    Myofascial Release  to decrease spasms/adhesions ITB and hamstring               PT Short Term Goals - 03/12/17 1107      PT SHORT TERM GOAL #1   Title  After 4 weeks patient will report improved tolerance to sitting car by 10 minutes of more prior to aggravation of pain.     Baseline  at eval tolerates 10 minutes prior to exacerbation     Status  On-going    Target Date  04/07/17      PT SHORT TERM GOAL #2   Title  After 2 weeks patient will demonstrate correct performance of HEP reporting good compliance.     Status  On-going    Target Date  03/24/17      PT SHORT TERM GOAL #3   Title  After 4 weeks, patient will demonstrate improved MMT testing, 5/5 in all groups, 4+/5 in glute med abduction, and 30-sec chair rise>14x.     Status  On-going    Target Date   04/07/17        PT Long Term Goals - 03/12/17 1107      PT LONG TERM GOAL #1   Title  After 8 weeks patient will report improved decreased pain at end of workday, less than 3/10.     Status  On-going    Target Date  05/05/17      PT LONG TERM GOAL #2   Title  After 8 weeks patient will demonstrate 30sec chair rise>18x to demonstrate improve strength for ladders at work.     Status  On-going    Target Date  05/05/17      PT LONG TERM GOAL #3   Title  After 8 weeks pt will demonstrate single leg squat on 6" step 10 times with godo control of knee mechanics and <2/10 pain.     Status  On-going    Target Date  05/05/17      PT LONG TERM GOAL #4   Title  After 8 weeks patienmt will demonstrate correect form and complaince for advanced HEP for DC to address remaining glute med/quads strength deficits.     Status  On-going    Target Date  05/05/17            Plan - 03/20/17 1035    Clinical Impression Statement  Continued with established POC with addition of lateral walks using TB resistance.  Pt without any pain or issues during session and reported feeling overall better at EOS.  Noted improvement in gait as well.  Tightness persist in Lt Lateral LE and into hamstriing region but responding well to manual .  Encouraged to continue HEP and will await MRI results.     Rehab Potential  Good    PT Frequency  2x / week    PT Duration  8 weeks    PT Treatment/Interventions  Balance training;Electrical Stimulation;Moist Heat;Gait training;Functional mobility training;Therapeutic activities;Therapeutic exercise;Manual techniques;Patient/family education;Visual/perceptual remediation/compensation;Passive range of motion;Dry needling    PT Next Visit Plan   Continue with quads and glute med strength, MFR/manual PRN. Await MRI results of Lt knee.    PT Home Exercise Plan  At eval: band bridge, LAQ15x3secH,  heel raises, seated heel slides, High knee marching isometrics;    Consulted and  Agree with Plan of Care  Patient       Patient will benefit from skilled therapeutic intervention in order to improve the following deficits and impairments:  Abnormal gait, Hypomobility, Increased edema, Decreased activity tolerance, Decreased strength, Pain, Decreased balance, Improper body mechanics, Decreased mobility, Decreased range of motion  Visit Diagnosis: Acute pain of left knee  Stiffness of left knee, not elsewhere classified  Muscle weakness (generalized)     Problem List There are no active problems to display for this patient. Teena Irani, PTA/CLT [phone]   Teena Irani 03/20/2017, 10:37 AM  [place], [place], [address] Phone: [phone]   Fax:  [phone]  Name: Luke Chavez MRN: [id number] Date of Birth: Jul 30, 1965

## 2017-03-20 NOTE — Progress Notes (Signed)
Patient Luke Chavez, male DOB:[birth year], 52 y.o. QQV:[id number]  Chief Complaint  Patient presents with  . Knee Pain    left    HPI  Luke Chavez is a 52 y.o. male who has left knee pain that is not better after PT.  He has swelling, giving way, pain.  I will get a MRI of the knee.  I am concerned about a medial meniscus tear. HPI  Body mass index is 34.02 kg/m.  ROS  Review of Systems  Musculoskeletal: Positive for arthralgias, gait problem and joint swelling.  All other systems reviewed and are negative.   Past Medical History:  Diagnosis Date  . Arthritis   . Chronic back pain   . Hypertension     Past Surgical History:  Procedure Laterality Date  . COLONOSCOPY N/A 04/17/2015   Procedure: COLONOSCOPY;  Surgeon: Danie Binder, MD;  [facility];  Service: Endoscopy;  Laterality: N/A;  1:00 PM    Family History  Problem Relation Age of Onset  . Cancer Father   . Arthritis Father   . Arthritis Maternal Aunt   . Arthritis Maternal Uncle   . Arthritis Paternal Aunt     Social History Social History   Tobacco Use  . Smoking status: Former Smoker    Types: Cigarettes  . Smokeless tobacco: Never Used  Substance Use Topics  . Alcohol use: No  . Drug use: Yes    Types: Cocaine, Marijuana    Comment: former    No Known Allergies  Current Outpatient Medications  Medication Sig Dispense Refill  . diclofenac sodium (VOLTAREN) 1 % GEL Apply 2 g topically 4 (four) times daily. 1 Tube 5  . naproxen (NAPROSYN) 500 MG tablet Take 1 tablet (500 mg total) by mouth 2 (two) times daily with a meal. 60 tablet 5  . aspirin 81 MG tablet Take 81 mg by mouth daily.     No current facility-administered medications for this visit.      Physical Exam  Blood pressure (!) 152/85, pulse 74, height 6\' 2"  (1.88 m), weight 265 lb (120.2 kg).  Constitutional: overall normal hygiene, normal nutrition, well developed, normal grooming, normal body  habitus. Assistive device:none  Musculoskeletal: gait and station Limp left , muscle tone and strength are normal, no tremors or atrophy is present.  .  Neurological: coordination overall normal.  Deep tendon reflex/nerve stretch intact.  Sensation normal.  Cranial nerves II-XII intact.   Skin:   Normal overall no scars, lesions, ulcers or rashes. No psoriasis.  Psychiatric: Alert and oriented x 3.  Recent memory intact, remote memory unclear.  Normal mood and affect. Well groomed.  Good eye contact.  Cardiovascular: overall no swelling, no varicosities, no edema bilaterally, normal temperatures of the legs and arms, no clubbing, cyanosis and good capillary refill.  Lymphatic: palpation is normal.  The left lower extremity is examined:  Inspection:  Thigh:  Non-tender and no defects  Knee has swelling 1+ effusion.                        Joint tenderness is present                        Patient is tender over the medial joint line  Lower Leg:  Has normal appearance and no tenderness or defects  Ankle:  Non-tender and no defects  Foot:  Non-tender and no defects Range of Motion:  Knee:  Range of motion is: 0-110                        Crepitus is  present  Ankle:  Range of motion is normal. Strength and Tone:  The left lower extremity has normal strength and tone. Stability:  Knee:  The knee has medial positive McMurray.  Ankle:  The ankle is stable.   All other systems reviewed and are negative   The patient has been educated about the nature of the problem(s) and counseled on treatment options.  The patient appeared to understand what I have discussed and is in agreement with it.  Encounter Diagnosis  Name Primary?  . Chronic pain of left knee Yes    PLAN Call if any problems.  Precautions discussed.  Continue current medications.   I am concerned about a medial meniscus tear.  Return to clinic MRi left knee   Electronically Signed Sanjuana Kava,  MD 1/17/20198:30 AM

## 2017-03-25 ENCOUNTER — Encounter (HOSPITAL_COMMUNITY): Payer: Self-pay | Admitting: Physical Therapy

## 2017-03-25 ENCOUNTER — Ambulatory Visit (HOSPITAL_COMMUNITY): Payer: BLUE CROSS/BLUE SHIELD | Admitting: Physical Therapy

## 2017-03-25 ENCOUNTER — Other Ambulatory Visit: Payer: Self-pay

## 2017-03-25 DIAGNOSIS — M25662 Stiffness of left knee, not elsewhere classified: Secondary | ICD-10-CM

## 2017-03-25 DIAGNOSIS — M6281 Muscle weakness (generalized): Secondary | ICD-10-CM | POA: Diagnosis not present

## 2017-03-25 DIAGNOSIS — M25562 Pain in left knee: Secondary | ICD-10-CM

## 2017-03-25 NOTE — Therapy (Signed)
[place], [place], [address] Phone: [phone]   Fax:  [phone]  Physical Therapy Treatment  Patient Details  Name: Luke Chavez MRN: [id number] Date of Birth: 05/19/1965 Referring Provider: [name]   Encounter Date: 03/25/2017  PT End of Session - 03/25/17 0956    Visit Number  5    Number of Visits  16    Date for PT Re-Evaluation  04/10/17    Authorization Type  BCBS other    Authorization Time Period  03/24/17-05/08/17    PT Start Time  0816    PT Stop Time  0900    PT Time Calculation (min)  44 min    Activity Tolerance  Patient tolerated treatment well;No increased pain    Behavior During Therapy  WFL for tasks assessed/performed       Past Medical History:  Diagnosis Date  . Arthritis   . Chronic back pain   . Hypertension     Past Surgical History:  Procedure Laterality Date  . COLONOSCOPY N/A 04/17/2015   Procedure: COLONOSCOPY;  Surgeon: Danie Binder, MD;  [facility];  Service: Endoscopy;  Laterality: N/A;  1:00 PM    There were no vitals filed for this visit.  Subjective Assessment - 03/25/17 0737    Subjective  Patient reports that he is having his MRI tomorrow. Patient reported that the pain isn't too bad a 2/10 today.     Currently in Pain?  Yes    Pain Score  2     Pain Location  Knee    Pain Orientation  Medial    Pain Descriptors / Indicators  Aching    Pain Type  Acute pain    Pain Onset  More than a month ago    Pain Frequency  Constant    Multiple Pain Sites  No                      [facility] Adult PT Treatment/Exercise - 03/25/17 0001      Knee/Hip Exercises: Standing   SLS  5x each lower extremity for 10-30 seconds      Knee/Hip Exercises: Supine   Quad Sets  10 reps;Left    Short Arc Quad Sets  Left;1 set;15 reps;Limitations    Short Arc Quad Sets Limitations  3# weight 3'' holds    Straight Leg Raises  Left;1 set;Limitations;15 reps     Straight Leg Raises Limitations  3# weight    Knee Extension  AROM;Limitations    Knee Extension Limitations  lacking 3 degrees; Rt lacking 2 degrees      Knee/Hip Exercises: Sidelying   Hip ABduction  15 reps;Limitations;Both    Hip ABduction Limitations  3# weight      Knee/Hip Exercises: Prone   Hamstring Curl  10 reps      Manual Therapy   Manual Therapy  Myofascial release;Soft tissue mobilization    Manual therapy comments  completed seperately from all other skilled interventions, at EOS    Soft tissue mobilization  to decrease tightness/improve mobilty perimeter of knee, hamstring and ITB for 8 minutes    Myofascial Release  to decrease spasms/adhesions ITB and hamstring for 8 minutes             PT Education - 03/25/17 0954    Education provided  Yes    Education Details  About proper technique and purpose of exercises throughout. Patient educated about  balance technique and focusing eyes in one spot to assist with balance.     Person(s) Educated  Patient    Methods  Explanation    Comprehension  Need further instruction       PT Short Term Goals - 03/12/17 1107      PT SHORT TERM GOAL #1   Title  After 4 weeks patient will report improved tolerance to sitting car by 10 minutes of more prior to aggravation of pain.     Baseline  at eval tolerates 10 minutes prior to exacerbation     Status  On-going    Target Date  04/07/17      PT SHORT TERM GOAL #2   Title  After 2 weeks patient will demonstrate correct performance of HEP reporting good compliance.     Status  On-going    Target Date  03/24/17      PT SHORT TERM GOAL #3   Title  After 4 weeks, patient will demonstrate improved MMT testing, 5/5 in all groups, 4+/5 in glute med abduction, and 30-sec chair rise>14x.     Status  On-going    Target Date  04/07/17        PT Long Term Goals - 03/12/17 1107      PT LONG TERM GOAL #1   Title  After 8 weeks patient will report improved decreased pain at  end of workday, less than 3/10.     Status  On-going    Target Date  05/05/17      PT LONG TERM GOAL #2   Title  After 8 weeks patient will demonstrate 30sec chair rise>18x to demonstrate improve strength for ladders at work.     Status  On-going    Target Date  05/05/17      PT LONG TERM GOAL #3   Title  After 8 weeks pt will demonstrate single leg squat on 6" step 10 times with godo control of knee mechanics and <2/10 pain.     Status  On-going    Target Date  05/05/17      PT LONG TERM GOAL #4   Title  After 8 weeks patienmt will demonstrate correect form and complaince for advanced HEP for DC to address remaining glute med/quads strength deficits.     Status  On-going    Target Date  05/05/17            Plan - 03/25/17 0957    Clinical Impression Statement  Patient reported a lower pain value this session compared to last at a 2/10 at the start of the session. Patient reported that he is going to have an MRI tomorrow to rule out a meniscus tear. Patient denied any catching or giving out and patient also denied any significant change in weight loss, fever, pain that keeps him up at night or changes in bowel and bladder function. Incorporated some additional strengthening on patient's unaffected side. Performed soft tissue mobilization and myofascial release as described above, noted particular tightness through patient's medial hamstring and spent more time in that area. Patient did not report an increase in pain at end of session. Patient would continue to benefit from skilled physical therapy in order to continue progressing in patient's goals and addressing current deficits in strength, balance, flexibility, pain, and overall functional mobility.     Rehab Potential  Good    PT Frequency  2x / week    PT Duration  8 weeks    PT  Treatment/Interventions  Balance training;Electrical Stimulation;Moist Heat;Gait training;Functional mobility training;Therapeutic activities;Therapeutic  exercise;Manual techniques;Patient/family education;Visual/perceptual remediation/compensation;Passive range of motion;Dry needling    PT Next Visit Plan   Continue with quads and glute med strength, MFR/manual PRN. Await MRI results of Lt knee.    PT Home Exercise Plan  At eval: band bridge, LAQ15x3secH, heel raises, seated heel slides, High knee marching isometrics;    Consulted and Agree with Plan of Care  Patient       Patient will benefit from skilled therapeutic intervention in order to improve the following deficits and impairments:  Abnormal gait, Hypomobility, Increased edema, Decreased activity tolerance, Decreased strength, Pain, Decreased balance, Improper body mechanics, Decreased mobility, Decreased range of motion  Visit Diagnosis: Acute pain of left knee  Stiffness of left knee, not elsewhere classified  Muscle weakness (generalized)     Problem List There are no active problems to display for this patient.  Clarene Critchley PT, DPT 10:05 AM, 03/25/17 [place], [place], [address] Phone: [phone]   Fax:  [phone]  Name: EXODUS KUTZER MRN: [id number] Date of Birth: 1965/04/08

## 2017-03-26 ENCOUNTER — Ambulatory Visit (HOSPITAL_COMMUNITY)
Admission: RE | Admit: 2017-03-26 | Discharge: 2017-03-26 | Disposition: A | Discharge status: Home / Self Care | Payer: BLUE CROSS/BLUE SHIELD | Source: Ambulatory Visit | Attending: Orthopaedic Surgery | Admitting: Orthopaedic Surgery

## 2017-03-26 DIAGNOSIS — M25562 Pain in left knee: Secondary | ICD-10-CM | POA: Diagnosis not present

## 2017-03-26 DIAGNOSIS — S83242A Other tear of medial meniscus, current injury, left knee, initial encounter: Secondary | ICD-10-CM | POA: Insufficient documentation

## 2017-03-26 DIAGNOSIS — M1712 Unilateral primary osteoarthritis, left knee: Secondary | ICD-10-CM | POA: Insufficient documentation

## 2017-03-26 DIAGNOSIS — G8929 Other chronic pain: Secondary | ICD-10-CM | POA: Diagnosis not present

## 2017-03-26 DIAGNOSIS — X58XXXA Exposure to other specified factors, initial encounter: Secondary | ICD-10-CM | POA: Insufficient documentation

## 2017-03-27 ENCOUNTER — Ambulatory Visit (HOSPITAL_COMMUNITY): Payer: BLUE CROSS/BLUE SHIELD

## 2017-03-27 ENCOUNTER — Encounter (HOSPITAL_COMMUNITY): Payer: Self-pay

## 2017-03-27 DIAGNOSIS — M25562 Pain in left knee: Secondary | ICD-10-CM

## 2017-03-27 DIAGNOSIS — M25662 Stiffness of left knee, not elsewhere classified: Secondary | ICD-10-CM

## 2017-03-27 DIAGNOSIS — M6281 Muscle weakness (generalized): Secondary | ICD-10-CM | POA: Diagnosis not present

## 2017-03-27 NOTE — Patient Instructions (Signed)
Abduction: Side Leg Lift (Eccentric) - Side-Lying    Lie on side. Lift top leg slightly higher than shoulder level. Keep top leg straight with body, toes pointing forward. Slowly lower for 3-5 seconds. 15 reps per set, 1-2 sets per day. http://ecce.exer.us/62   Copyright  VHI. All rights reserved.

## 2017-03-27 NOTE — Therapy (Signed)
[place], [place], [address] Phone: [phone]   Fax:  [phone]  Physical Therapy Treatment  Patient Details  Name: Luke Chavez MRN: [id number] Date of Birth: 11/21/1965 Referring Provider: [name]   Encounter Date: 03/27/2017  PT End of Session - 03/27/17 0900    Visit Number  6    Number of Visits  16    Date for PT Re-Evaluation  04/10/17    Authorization Type  BCBS other    Authorization Time Period  03/24/17-05/08/17    PT Start Time  0816    PT Stop Time  0858    PT Time Calculation (min)  42 min    Activity Tolerance  Patient tolerated treatment well;No increased pain    Behavior During Therapy  WFL for tasks assessed/performed       Past Medical History:  Diagnosis Date  . Arthritis   . Chronic back pain   . Hypertension     Past Surgical History:  Procedure Laterality Date  . COLONOSCOPY N/A 04/17/2015   Procedure: COLONOSCOPY;  Surgeon: Danie Binder, MD;  [facility];  Service: Endoscopy;  Laterality: N/A;  1:00 PM    There were no vitals filed for this visit.  Subjective Assessment - 03/27/17 0821    Subjective  Pt reports he had MRI yesterday, apt with MD scheduled on 1/30.  Reports main difficulty with prolonged sitting greater than 5 minutes.  Current pain scale 2/10 achey knee on medial aspect of knee.      Patient Stated Goals  Decrease pain at work.     Currently in Pain?  Yes    Pain Score  2     Pain Location  Knee    Pain Orientation  Left;Medial    Pain Descriptors / Indicators  Aching    Pain Type  Acute pain    Pain Onset  More than a month ago    Pain Frequency  Constant    Aggravating Factors   prolonger sitting, stairs, kneeling, squatting    Pain Relieving Factors  moving his knee, positional changed, aleve, rest; not traied heat or ice    Effect of Pain on Daily Activities  he suffers through it                      [facility] Adult PT  Treatment/Exercise - 03/27/17 0001      Knee/Hip Exercises: Stretches   Active Hamstring Stretch  Left;3 reps;30 seconds;Limitations    Active Hamstring Stretch Limitations  supine      Knee/Hip Exercises: Standing   Terminal Knee Extension  10 reps;Left;Theraband    Theraband Level (Terminal Knee Extension)  Level 3 (Green)    Rocker Board  2 minutes lateral    SLS  Rt 18", Lt 31" max of 3    SLS with Vectors  5x5" on foam wiht intermittent HHA    Gait Training  253ft to assess equal stance phase    Other Standing Knee Exercises  sidestepping with GTB 2RT      Knee/Hip Exercises: Supine   Short Arc Quad Sets  Left;1 set;15 reps;Limitations    Short Arc Quad Sets Limitations  4#, 3"    Straight Leg Raises  Left;1 set;Limitations;15 reps    Straight Leg Raises Limitations  4# weight    Knee Extension  AROM;Limitations    Knee Extension Limitations  Lt lacking 2 degrees  Knee Flexion  AROM;Limitations    Knee Flexion Limitations  125 degrees and equal to RT LE      Knee/Hip Exercises: Sidelying   Hip ABduction  15 reps;Limitations;Both    Hip ABduction Limitations  3# weight      Manual Therapy   Manual Therapy  Myofascial release;Soft tissue mobilization    Manual therapy comments  completed seperately from all other skilled interventions, at EOS    Soft tissue mobilization  to decrease tightness/improve mobilty perimeter of knee, hamstring and ITB for 8 minutes    Myofascial Release  to decrease spasms/adhesions ITB and hamstring for 8 minutes             PT Education - 03/27/17 0900    Education provided  Yes    Education Details  Discussed RICE for pain and inflammation    Person(s) Educated  Patient    Methods  Explanation    Comprehension  Verbalized understanding       PT Short Term Goals - 03/12/17 1107      PT SHORT TERM GOAL #1   Title  After 4 weeks patient will report improved tolerance to sitting car by 10 minutes of more prior to aggravation of  pain.     Baseline  at eval tolerates 10 minutes prior to exacerbation     Status  On-going    Target Date  04/07/17      PT SHORT TERM GOAL #2   Title  After 2 weeks patient will demonstrate correct performance of HEP reporting good compliance.     Status  On-going    Target Date  03/24/17      PT SHORT TERM GOAL #3   Title  After 4 weeks, patient will demonstrate improved MMT testing, 5/5 in all groups, 4+/5 in glute med abduction, and 30-sec chair rise>14x.     Status  On-going    Target Date  04/07/17        PT Long Term Goals - 03/12/17 1107      PT LONG TERM GOAL #1   Title  After 8 weeks patient will report improved decreased pain at end of workday, less than 3/10.     Status  On-going    Target Date  05/05/17      PT LONG TERM GOAL #2   Title  After 8 weeks patient will demonstrate 30sec chair rise>18x to demonstrate improve strength for ladders at work.     Status  On-going    Target Date  05/05/17      PT LONG TERM GOAL #3   Title  After 8 weeks pt will demonstrate single leg squat on 6" step 10 times with godo control of knee mechanics and <2/10 pain.     Status  On-going    Target Date  05/05/17      PT LONG TERM GOAL #4   Title  After 8 weeks patienmt will demonstrate correect form and complaince for advanced HEP for DC to address remaining glute med/quads strength deficits.     Status  On-going    Target Date  05/05/17            Plan - 03/27/17 0940    Clinical Impression Statement  Continued POC for quad and gluteal strengthening.  Added rockerboard to improve weight distribution with gait and TKA to improve knee extension wiht gait.  Pt stated he continues to have medial knee pain wiht inflammation, pt educated on RICE technqiues  to address edema and pain, verbal understanding.  Pt with MRI yesterday, awaiting results wiht next apt on 1/30.  No reports of increased pain through session.      Rehab Potential  Good    PT Frequency  2x / week    PT  Duration  8 weeks    PT Treatment/Interventions  Balance training;Electrical Stimulation;Moist Heat;Gait training;Functional mobility training;Therapeutic activities;Therapeutic exercise;Manual techniques;Patient/family education;Visual/perceptual remediation/compensation;Passive range of motion;Dry needling    PT Next Visit Plan   Continue with quads and glute med strength, MFR/manual PRN. Await MRI results of Lt knee.    PT Home Exercise Plan  At eval: band bridge, LAQ15x3secH, heel raises, seated heel slides, High knee marching isometrics; abduction       Patient will benefit from skilled therapeutic intervention in order to improve the following deficits and impairments:  Abnormal gait, Hypomobility, Increased edema, Decreased activity tolerance, Decreased strength, Pain, Decreased balance, Improper body mechanics, Decreased mobility, Decreased range of motion  Visit Diagnosis: Acute pain of left knee  Stiffness of left knee, not elsewhere classified  Muscle weakness (generalized)     Problem List There are no active problems to display for this patient.  [address] LPTA; [place]  Aldona Lento 03/27/2017, 9:43 AM  [place], [place], [address] Phone: [phone]   Fax:  [phone]  Name: DARNEL MCHAN MRN: [id number] Date of Birth: 1965-07-02

## 2017-03-31 ENCOUNTER — Ambulatory Visit (HOSPITAL_COMMUNITY): Payer: BLUE CROSS/BLUE SHIELD

## 2017-03-31 ENCOUNTER — Other Ambulatory Visit: Payer: Self-pay

## 2017-03-31 ENCOUNTER — Encounter (HOSPITAL_COMMUNITY): Payer: Self-pay

## 2017-03-31 DIAGNOSIS — M25662 Stiffness of left knee, not elsewhere classified: Secondary | ICD-10-CM | POA: Diagnosis not present

## 2017-03-31 DIAGNOSIS — M6281 Muscle weakness (generalized): Secondary | ICD-10-CM | POA: Diagnosis not present

## 2017-03-31 DIAGNOSIS — M25562 Pain in left knee: Secondary | ICD-10-CM | POA: Diagnosis not present

## 2017-03-31 NOTE — Therapy (Addendum)
[place] [address] [place], [place], [address] Phone: [phone]   Fax:  [phone]  Physical Therapy Treatment/Dicharge Summary  Patient Details  Name: Luke Chavez MRN: [id number] Date of Birth: 17-Jun-1965 Referring Provider: Sanjuana Kava, MD   PHYSICAL THERAPY DISCHARGE SUMMARY  Visits from Start of Care: 7  Current functional level related to goals / functional outcomes: Re-assessment performed 03/31/17 as patient has appointment with MD tomorrow. Patient has met or partially met 2/3 short-term goals and is progressing well towards long-term goals. He has made significant improvement in left knee ROM and now demonstrates 5/5 functional strength for BLE and improved SLS balance. Mr. [name] continues to be limited by left medial knee pain and has pain with valgus stress testing and tenderness to palpation along medial knee. 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   Remaining deficits: See below details    Education / Equipment: 03/31/17 - Educated on form/technique throughout. Reviewed importance of ice post HEP and PT sessions. Discussed progress towards STG's and LTG's. Educated on discussion of use of naproxen and effect on BP with MD at next appointment.    Plan: Patient agrees to discharge.  Patient goals were partially met. Patient is being discharged due to the patient's request.  ?????     Patient called to cancel all appointments. Sounds as though [name] will proceed with surgery. Please DC patient.   Kipp Brood, PT, DPT Physical Therapist with [facility]  04/03/2017 9:32 AM     Encounter Date: 03/31/2017  PT End of Session - 03/31/17 0939    Visit Number  7    Number of Visits  16    Date for PT Re-Evaluation  05/05/17    Authorization Type  BCBS other    Authorization Time Period   03/24/17-05/08/17    PT Start Time  0905    PT Stop Time  0945    PT Time Calculation (min)  40 min    Activity Tolerance  Patient tolerated treatment well;No increased pain    Behavior During Therapy  WFL for tasks assessed/performed       Past Medical History:  Diagnosis Date  . Arthritis   . Chronic back pain   . Hypertension     Past Surgical History:  Procedure Laterality Date  . COLONOSCOPY N/A 04/17/2015   Procedure: COLONOSCOPY;  Surgeon: Danie Binder, MD;  [facility];  Service: Endoscopy;  Laterality: N/A;  1:00 PM    There were no vitals filed for this visit.  Subjective Assessment - 03/31/17 0907    Subjective  Patient states he has a follow up appointment with Dr. [name] tomorrow to review the results of his MRI. He reports his knee pain work him up some this weekend and that he did not sleep in his knee his brace. He continues to have the greatest pain in sitting and has started using ice to manage pain and swelling.  Patient states he stopped taking the naproxen for his knee swelling as he thinks it was increasing his BP.    How long can you sit comfortably?  about 5 mins currently    How long can you stand comfortably?  a couple hours    How long can you walk comfortably?  not limited    Patient Stated Goals  Decrease pain at work.  Currently in Pain?  Yes    Pain Score  3     Pain Location  Knee    Pain Orientation  Left;Medial    Pain Descriptors / Indicators  Aching    Pain Type  Chronic pain    Pain Onset  More than a month ago    Pain Frequency  Constant    Aggravating Factors   prolonged sitting, kneeling    Pain Relieving Factors  walking, standing, moving, ice    Effect of Pain on Daily Activities  pushes through it    Multiple Pain Sites  No         [facility] PT Assessment - 03/31/17 0001      Assessment   Medical Diagnosis  Left knee pain     Referring Provider  Sanjuana Kava, MD    Onset Date/Surgical Date  -- November 2018     Hand Dominance  Right    Next MD Visit  04/01/17      Functional Tests   Functional tests  Single leg stance      Single Leg Stance   Comments  BLE: 30 seconds with eyes open 03/10/17 - 42 sec on right, 14 secon on left      PROM   PROM Assessment Site  Knee    Right/Left Knee  Left    Left Knee Extension  3    Left Knee Flexion  125 painful at end range      Strength   Right/Left Hip  Right;Left    Right Hip Flexion  5/5    Right Hip Extension  5/5    Right Hip External Rotation   4+/5    Right Hip Internal Rotation  5/5    Right Hip ABduction  5/5    Right Hip ADduction  5/5    Left Hip Flexion  5/5    Left Hip Extension  5/5    Left Hip External Rotation  5/5    Left Hip Internal Rotation  5/5    Left Hip ABduction  5/5    Left Hip ADduction  5/5    Right/Left Knee  Right;Left    Right Knee Flexion  5/5    Right Knee Extension  5/5    Left Knee Flexion  5/5    Left Knee Extension  5/5      other    Findings  Positive    Side   Left    Comments  valgus stress test at 0 & 30 degrees:       Transfers   Comments  30sec chair rise: 10x       [facility] Adult PT Treatment/Exercise - 03/31/17 0001      Knee/Hip Exercises: Standing   Terminal Knee Extension  Left;Theraband;15 reps    Theraband Level (Terminal Knee Extension)  Level 3 (Green)    Lateral Step Up  1 set;Both;10 reps;Step Height: 6";Limitations intermittent hand hold    Lateral Step Up Limitations  contralateral knee drive for SLS balance    Functional Squat  --    Wall Squat  1 set;10 reps;Limitations    Wall Squat Limitations  wall slide    Rocker Board  2 minutes;Limitations    Rocker Board Limitations  2x 1 minute, lateral    Other Standing Knee Exercises  steamboats: 1x 10 reps 4 way hip, green theraband       PT Education - 03/31/17 0943    Education provided  Yes  Education Details  Educated on form/technique throughout. Reviewed importance of ice post HEP and PT sessions. Discussed progress  towards STG's and LTG's. Educated on discussion of use of naproxen and effect on BP with MD at next appointment.    Person(s) Educated  Patient    Methods  Explanation    Comprehension  Verbalized understanding;Returned demonstration       PT Short Term Goals - 03/31/17 0921      PT SHORT TERM GOAL #1   Title  After 4 weeks patient will report improved tolerance to sitting car by 10 minutes of more prior to aggravation of pain.     Baseline  at eval tolerates 10 minutes prior to exacerbation; 03/31/17 - about 5 minutes     Status  On-going      PT SHORT TERM GOAL #2   Title  After 2 weeks patient will demonstrate correct performance of HEP reporting good compliance.     Baseline  03/31/17 - patient reports every day    Status  Achieved      PT SHORT TERM GOAL #3   Title  After 4 weeks, patient will demonstrate improved MMT testing, 5/5 in all groups, 4+/5 in glute med abduction, and 30-sec chair rise>14x.     Baseline  03/31/17 - 5/5 throughout no pain, 10x for 30 seconds chair raise wihtout UE use    Status  Partially Met        PT Long Term Goals - 03/31/17 3013      PT LONG TERM GOAL #1   Title  After 8 weeks patient will report improved decreased pain at end of workday, less than 3/10.     Baseline  03/31/17 - max at work of 4/10    Status  On-going      PT LONG TERM GOAL #2   Title  After 8 weeks patient will demonstrate 30sec chair rise>18x to demonstrate improve strength for ladders at work.     Baseline  03/31/17 - 10 times in 30 seconds    Status  On-going      PT LONG TERM GOAL #3   Title  After 8 weeks pt will demonstrate single leg squat on 6" step 10 times with good control of knee mechanics and <2/10 pain.     Baseline  03/31/17 - not tested this date.; 10 reps of BLE wall squat initiated this session, patient denied pain    Status  On-going      PT LONG TERM GOAL #4   Title  After 8 weeks patienmt will demonstrate correect form and complaince for advanced HEP for  DC to address remaining glute med/quads strength deficits.     Baseline  03/31/17 - patient compliant with basic HEP    Status  On-going        Plan - 03/31/17 0926    Clinical Impression Statement  Re-assessment performed today as patient has appointment with MD tomorrow. Patient has met or partially met 2/3 short-term goals and is progressing well towards long-term goals. He has made significant improvement in left knee ROM and now demonstrates 5/5 functional strength for BLE and improved SLS balance. Mr. Arneson continues to be limited by left medial knee pain and has pain with valgus stress testing and tenderness to palpation along medial knee. He will continue to benefit from skilled PT services to address current limitations and progress towards remaining goals to reduce pain and improve QOL.    Rehab Potential  Good  PT Frequency  2x / week    PT Duration  8 weeks    PT Treatment/Interventions  Balance training;Electrical Stimulation;Moist Heat;Gait training;Functional mobility training;Therapeutic activities;Therapeutic exercise;Manual techniques;Patient/family education;Visual/perceptual remediation/compensation;Passive range of motion;Dry needling    PT Next Visit Plan   Continue with quads and glute med strength, MFR/manual PRN. Await MRI results of Lt knee. Continue to educate on ice and elevation for swelling. Progress CKC exercises in pain free range. FOTO next session.    PT Home Exercise Plan  At eval: band bridge, LAQ15x3secH, heel raises, seated heel slides, High knee marching isometrics; abduction    Consulted and Agree with Plan of Care  Patient       Patient will benefit from skilled therapeutic intervention in order to improve the following deficits and impairments:  Abnormal gait, Hypomobility, Increased edema, Decreased activity tolerance, Decreased strength, Pain, Decreased balance, Improper body mechanics, Decreased mobility, Decreased range of motion  Visit  Diagnosis: Acute pain of left knee  Stiffness of left knee, not elsewhere classified  Muscle weakness (generalized)     Problem List There are no active problems to display for this patient.   Kipp Brood, PT, DPT Physical Therapist with [facility]  03/31/2017 11:17 AM    [place], [place], [address] Phone: [phone]   Fax:  [phone]  Name: DAREN YEAGLE MRN: [id number] Date of Birth: 01-08-1966

## 2017-04-01 ENCOUNTER — Encounter: Payer: Self-pay | Admitting: Orthopaedic Surgery

## 2017-04-01 ENCOUNTER — Telehealth (HOSPITAL_COMMUNITY): Payer: Self-pay | Admitting: Family Medicine

## 2017-04-01 ENCOUNTER — Ambulatory Visit (INDEPENDENT_AMBULATORY_CARE_PROVIDER_SITE_OTHER): Payer: BLUE CROSS/BLUE SHIELD | Admitting: Orthopaedic Surgery

## 2017-04-01 VITALS — BP 150/103 | HR 85 | Ht 74.0 in | Wt 265.0 lb

## 2017-04-01 DIAGNOSIS — M25562 Pain in left knee: Secondary | ICD-10-CM

## 2017-04-01 DIAGNOSIS — G8929 Other chronic pain: Secondary | ICD-10-CM

## 2017-04-01 NOTE — Progress Notes (Signed)
Patient Luke Chavez, male DOB:Oct 07, 1965, 52 y.o. QBH:[id number]  Chief Complaint  Patient presents with  . Knee Pain    left    HPI  Luke Chavez is a 52 y.o. male who has continued left knee pain.  He had a MRI done and it shows: IMPRESSION: 1. Severe advanced osteoarthritis of the medial femorotibial compartment. 2. Degeneration of the posterior horn and body of the medial meniscus with a radial tear of free edge of the body of the medial Meniscus.  I have explained the findings to him.  I have recommended consideration of arthroscopy.  I will have him see Dr. Aline Brochure for this.  He is agreeable to this. HPI  Body mass index is 34.02 kg/m.  ROS  Review of Systems  Musculoskeletal: Positive for arthralgias, gait problem and joint swelling.  All other systems reviewed and are negative.   Past Medical History:  Diagnosis Date  . Arthritis   . Chronic back pain   . Hypertension     Past Surgical History:  Procedure Laterality Date  . COLONOSCOPY N/A 04/17/2015   Procedure: COLONOSCOPY;  Surgeon: Danie Binder, MD;  [facility];  Service: Endoscopy;  Laterality: N/A;  1:00 PM    Family History  Problem Relation Age of Onset  . Cancer Father   . Arthritis Father   . Arthritis Maternal Aunt   . Arthritis Maternal Uncle   . Arthritis Paternal Aunt     Social History Social History   Tobacco Use  . Smoking status: Former Smoker    Types: Cigarettes  . Smokeless tobacco: Never Used  Substance Use Topics  . Alcohol use: No  . Drug use: Yes    Types: Cocaine, Marijuana    Comment: former    No Known Allergies  Current Outpatient Medications  Medication Sig Dispense Refill  . aspirin 81 MG tablet Take 81 mg by mouth daily.    . diclofenac sodium (VOLTAREN) 1 % GEL Apply 2 g topically 4 (four) times daily. 1 Tube 5  . naproxen (NAPROSYN) 500 MG tablet Take 1 tablet (500 mg total) by mouth 2 (two) times daily with a meal. (Patient not  taking: Reported on 04/01/2017) 60 tablet 5   No current facility-administered medications for this visit.      Physical Exam  Blood pressure (!) 150/103, pulse 85, height 6\' 2"  (1.88 m), weight 265 lb (120.2 kg).  Constitutional: overall normal hygiene, normal nutrition, well developed, normal grooming, normal body habitus. Assistive device:none  Musculoskeletal: gait and station Limp left, muscle tone and strength are normal, no tremors or atrophy is present.  .  Neurological: coordination overall normal.  Deep tendon reflex/nerve stretch intact.  Sensation normal.  Cranial nerves II-XII intact.   Skin:   Normal overall no scars, lesions, ulcers or rashes. No psoriasis.  Psychiatric: Alert and oriented x 3.  Recent memory intact, remote memory unclear.  Normal mood and affect. Well groomed.  Good eye contact.  Cardiovascular: overall no swelling, no varicosities, no edema bilaterally, normal temperatures of the legs and arms, no clubbing, cyanosis and good capillary refill.  Lymphatic: palpation is normal.  The left lower extremity is examined:  Inspection:  Thigh:  Non-tender and no defects  Knee has swelling 1+ effusion.                        Joint tenderness is present  Patient is tender over the medial joint line  Lower Leg:  Has normal appearance and no tenderness or defects  Ankle:  Non-tender and no defects  Foot:  Non-tender and no defects Range of Motion:  Knee:  Range of motion is: 0-110                        Crepitus is  present  Ankle:  Range of motion is normal. Strength and Tone:  The left lower extremity has normal strength and tone. Stability:  Knee:  The knee has positive medial McMurray.  Ankle:  The ankle is stable.   All other systems reviewed and are negative   The patient has been educated about the nature of the problem(s) and counseled on treatment options.  The patient appeared to understand what I have discussed and is  in agreement with it.  Encounter Diagnosis  Name Primary?  . Chronic pain of left knee Yes    PLAN Call if any problems.  Precautions discussed.  Continue current medications.   Return to clinic to see Dr. Aline Brochure   Electronically Signed Sanjuana Kava, MD 1/29/20198:32 AM

## 2017-04-01 NOTE — Patient Instructions (Signed)
Knee Arthroscopy Knee arthroscopy is a surgical procedure that is used to examine the inside of your knee joint and repair any damage. The surgeon puts a small, lighted instrument with a camera on the tip (arthroscope) through a small incision in your knee. The camera sends pictures to a monitor in the operating room. Your surgeon uses those pictures to guide the surgical instruments through other incisions to the area of damage. Knee arthroscopy can be used to treat many types of knee problems. It may be used:  To repair a torn ligament.  To repair or remove damaged tissue.  To remove a fluid-filled sac (cyst) from your knee.  Tell a health care provider about:  Any allergies you have.  All medicines you are taking, including vitamins, herbs, eye drops, creams, and over-the-counter medicines.  Any problems you or family members have had with anesthetic medicines.  Any blood disorders you have.  Any surgeries you have had.  Any medical conditions you have. What are the risks? Generally, this is a safe procedure. However, problems may occur, including:  Infection.  Bleeding.  Damage to blood vessels, nerves, or structures of your knee.  A blood clot that forms in your leg and travels to your lung.  Failure to relieve symptoms.  What happens before the procedure?  Ask your health care provider about: ? Changing or stopping your regular medicines. This is especially important if you are taking diabetes medicines or blood thinners. ? Taking medicines such as aspirin and ibuprofen. These medicines can thin your blood. Do not take these medicines before your procedure if your health care provider instructs you not to.  Follow your health care provider's instructions about eating or drinking restrictions.  Plan to have someone take you home after the procedure.  If you go home right after the procedure, plan to have someone with you for 24 hours.  Do not drink alcohol unless  your health care provider says that you can.  Do not use any tobacco products, including cigarettes, chewing tobacco, or electronic cigarettes unless your health care provider says that you can. If you need help quitting, ask your health care provider.  You may have a physical exam. What happens during the procedure?  An IV tube will be inserted into one of your veins.  You will be given one or more of the following: ? A medicine that helps you relax (sedative). ? A medicine that numbs the area (local anesthetic). ? A medicine that makes you fall asleep (general anesthetic). ? A medicine that is injected into your spine that numbs the area below and slightly above the injection site (spinal anesthetic). ? A medicine that is injected into an area of your body that numbs everything below the injection site (regional anesthetic).  A cuff may be placed around your upper leg to slow bleeding during the procedure.  The surgeon will make a small number of incisions around your knee.  Your knee joint will be flushed and filled with a germ-free (sterile) solution.  The arthroscope will be passed through an incision into your knee joint.  More instruments will be passed through other incisions to repair your knee as needed.  The fluid will be removed from your knee.  The incisions will be closed with adhesive strips or stitches (sutures).  A bandage (dressing) will be placed over your knee. The procedure may vary among health care providers and hospitals. What happens after the procedure?  Your blood pressure, heart rate, breathing  rate and blood oxygen level will be monitored often until the medicines you were given have worn off.  You may be given medicine for pain.  You may get crutches to help you walk without using your knee to support your body weight.  You may have to wear compression stockings. These stocking help to prevent blood clots and reduce swelling in your legs. This  information is not intended to replace advice given to you by your health care provider. Make sure you discuss any questions you have with your health care provider. Document Released: 02/16/2000 Document Revised: 07/27/2015 Document Reviewed: 02/14/2014 Elsevier Interactive Patient Education  2018 [employer]  

## 2017-04-01 NOTE — Telephone Encounter (Signed)
04/01/17  his dr told him he didn't need therapy until after his surgery

## 2017-04-03 ENCOUNTER — Encounter (HOSPITAL_COMMUNITY): Payer: Self-pay | Admitting: Physical Therapy

## 2017-04-07 ENCOUNTER — Encounter: Payer: Self-pay | Admitting: Orthopedic Surgery

## 2017-04-07 ENCOUNTER — Ambulatory Visit: Payer: BLUE CROSS/BLUE SHIELD | Admitting: Orthopedic Surgery

## 2017-04-07 VITALS — BP 138/82 | HR 88 | Ht 74.0 in | Wt 267.0 lb

## 2017-04-07 DIAGNOSIS — M1712 Unilateral primary osteoarthritis, left knee: Secondary | ICD-10-CM | POA: Diagnosis not present

## 2017-04-07 DIAGNOSIS — M23322 Other meniscus derangements, posterior horn of medial meniscus, left knee: Secondary | ICD-10-CM | POA: Diagnosis not present

## 2017-04-07 NOTE — Progress Notes (Signed)
PRE-OP  Chief Complaint  Patient presents with  . Knee Pain    left    52 year old male presents for evaluation of his left knee  He was seen by Dr. 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 Arthritis   . Chronic back pain   . Hypertension    Past Surgical History:  Procedure Laterality Date  . COLONOSCOPY N/A 04/17/2015   Procedure: COLONOSCOPY;  Surgeon: Danie Binder, MD;  Location: [facility];  Service: Endoscopy;  Laterality: N/A;  1:00 PM   Social History   Tobacco Use  . Smoking status: Former Smoker    Types: Cigarettes  . Smokeless tobacco: Never Used  Substance Use Topics  . Alcohol use: No  . Drug use: Yes    Types: Cocaine, Marijuana    Comment: former   Family History  Problem Relation Age of Onset  . Cancer Father   . Arthritis Father   . Arthritis Maternal Aunt   . Arthritis Maternal Uncle   . Arthritis Paternal Aunt     Current Outpatient Medications:  .  aspirin 81 MG tablet, Take 81 mg by mouth daily., Disp: , Rfl:   No Known Allergies   BP 138/82   Pulse 88   Ht 6\' 2"  (1.88 m)   Wt 267 lb (121.1 kg)   BMI 34.28 kg/m    Physical Exam  Constitutional: He is oriented to person, place, and time. He appears well-developed and well-nourished.  Vital signs have been reviewed and are stable. Gen. appearance the patient is well-developed and well-nourished with normal grooming and hygiene.   Musculoskeletal:       Legs:    Neurological: He is alert and  oriented to person, place, and time.  Skin: Skin is warm and dry. No erythema.  Psychiatric: He has a normal mood and affect.  Vitals reviewed.  Imaging  The plain film shows a varus aligned knee with very little joint space remaining between the medial femoral condyle and tibial plateau no significant osteophyte formation, oblique view X shows exacerbated loss of joint space medially, there is no patellofemoral view   NOB:[id number]: 1. Severe advanced osteoarthritis of the medial femorotibial compartment. 2. Degeneration of the posterior horn and body of the medial meniscus with a radial tear of free edge of the body of the medial meniscus.     Electronically Signed   By: Kathreen Devoid   On: 03/26/2017 15:58    Encounter Diagnoses  Name Primary?  . Derangement of posterior horn of medial meniscus of left knee Yes  . Primary osteoarthritis of left knee     Plan I discussed with the patient potential options including knee replacement no surgery or arthroscopy  He would like to try arthroscopy first he understands that he would probably need a knee replacement and we cannot determine how long it will take before he needs the knee replacement.  The procedure has been fully reviewed with the patient; The risks and  benefits of surgery have been discussed and explained and understood. Alternative treatment has also been reviewed, questions were encouraged and answered. The postoperative plan is also been reviewed.   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

## 2017-04-07 NOTE — Patient Instructions (Signed)
Meniscus Injury, Arthroscopy Arthroscopy is a surgical procedure that involves the use of a small scope that has a camera and surgical instruments on the end (arthroscope). An arthroscope can be used to repair your meniscus injury.  LET YOUR HEALTH CARE PROVIDER KNOW ABOUT:  Any allergies you have.  All medicines you are taking, including vitamins, herbs, eyedrops, creams, and over-the-counter medicines.  Any recent colds or infections you have had or currently have.  Previous problems you or members of your family have had with the use of anesthetics.  Any blood disorders or blood clotting problems you have.  Previous surgeries you have had.  Medical conditions you have. RISKS AND COMPLICATIONS Generally, this is a safe procedure. However, as with any procedure, problems can occur. Possible problems include:  Damage to nerves or blood vessels.  Excess bleeding.  Blood clots.  Infection. BEFORE THE PROCEDURE  Do not eat or drink for 6-8 hours before the procedure.  Take medicines as directed by your surgeon. Ask your surgeon about changing or stopping your regular medicines.  You may have lab tests the morning of surgery. PROCEDURE  You will be given one of the following:   A medicine that numbs the area (local anesthesia).  A medicine that makes you go to sleep (general anesthesia).  A medicine injected into your spine that numbs your body below the waist (spinal anesthesia). Most often, several small cuts (incisions) are made in the knee. The arthroscope and instruments go into the incisions to repair the damage. The torn portion of the meniscus is removed.   AFTER THE PROCEDURE  You will be taken to the recovery area where your progress will be monitored. When you are awake, stable, and taking fluids without complications, you will be allowed to go home. This is usually the same day. A torn or stretched ligament (ligament sprain) may take 6-8 weeks to heal.   It  takes about the 4-6 WEEKS if your surgeon removed a torn meniscus.  A repaired meniscus may require 6-12 weeks of recovery time.  A torn ligament needing reconstructive surgery may take 6-12 months to heal fully.   This information is not intended to replace advice given to you by your health care provider. Make sure you discuss any questions you have with your health care provider. You have decided to proceed with operative arthroscopy of the knee. You have decided not to continue with nonoperative measures such as but not limited to oral medication, weight loss, activity modification, physical therapy, bracing, or injection.  We will perform operative arthroscopy of the knee. Some of the risks associated with arthroscopic surgery of the knee include but are not limited to Bleeding Infection Swelling Stiffness Blood clot Pain  If you're not comfortable with these risks and would like to continue with nonoperative treatment please let Dr. Harrison know prior to your surgery.   Document Released: 02/16/2000 Document Revised: 02/23/2013 Document Reviewed: 07/17/2012 Elsevier Interactive Patient Education 2016 [employer] You have decided to proceed with operative arthroscopy of the knee. You have decided not to continue with nonoperative measures such as but not limited to oral medication, weight loss, activity modification, physical therapy, bracing, or injection.  We will perform operative arthroscopy of the knee. Some of the risks associated with arthroscopic surgery of the knee include but are not limited to Bleeding Infection Swelling Stiffness Blood clot Pain  If you're not comfortable with these risks and would like to continue with nonoperative treatment please let Dr. Harrison   know prior to your surgery. 

## 2017-04-08 ENCOUNTER — Telehealth: Payer: Self-pay | Admitting: Radiology

## 2017-04-08 NOTE — Telephone Encounter (Signed)
Called [employer] and spoke to [place] about the prior auth for the surgery CPT [id number] / Ref number is her name todays date and the subscriber ID for patient.

## 2017-04-21 NOTE — Patient Instructions (Signed)
Luke Chavez  04/21/2017     @PREFPERIOPPHARMACY @   Your procedure is scheduled on  04/24/2017 .  Report to [facility]  at  715   A.M.  Call this number if you have problems the morning of surgery:  [phone]   Remember:  Do not eat food or drink liquids after midnight.  Take these medicines the morning of surgery with A SIP OF WATER  None   Do not wear jewelry, make-up or nail polish.  Do not wear lotions, powders, or perfumes, or deodorant.  Do not shave 48 hours prior to surgery.  Men may shave face and neck.  Do not bring valuables to the hospital.  [facility] is not responsible for any belongings or valuables.  Contacts, dentures or bridgework may not be worn into surgery.  Leave your suitcase in the car.  After surgery it may be brought to your room.  For patients admitted to the hospital, discharge time will be determined by your treatment team.  Patients discharged the day of surgery will not be allowed to drive home.   Name and phone number of your driver:   family Special instructions:   None  Please read over the following fact sheets that you were given. Anesthesia Post-op Instructions and Care and Recovery After Surgery      Knee Ligament Injury, Arthroscopy Arthroscopy is a surgical technique in which your health care provider examines your knee through a small, pencil-sized telescope (arthroscope). Often, repairs to injured ligaments can be done with instruments in the arthroscope. Arthroscopy is less invasive than open-knee surgery. Tell a health care provider about:  Any allergies you have.  All medicines you are taking, including vitamins, herbs, eye drops, creams, and over-the-counter medicines.  Any problems you or family members have had with anesthetic medicines.  Any blood disorders you have.  Any surgeries you have had.  Any medical conditions you have. What are the risks? Generally, this is a safe procedure. However, as  with any procedure, problems can occur. Possible problems include:  Infection.  Bleeding.  Stiffness.  What happens before the procedure?  Ask your health care provider about changing or stopping any regular medicines. Avoid taking aspirin or blood thinners as directed by your health care provider.  Do not eat or drink anything after midnight the night before surgery.  If you smoke, do not smoke for at least 2 weeks before your surgery.  Do not drink alcohol starting the day before your surgery.  Let your health care provider know if you develop a cold or any infection before your surgery.  Arrange for someone to drive you home after the surgery or after your hospital stay. Also arrange for someone to help you with activities during recovery. What happens during the procedure?  Small monitors will be put on your body. They are used to check your heart, blood pressure, and oxygen levels.  An IV access tube will be put into one of your veins. Medicine will be able to flow directly into your body through this IV tube.  You might be given a medicine to help you relax (sedative).  You will be given a medicine that makes you go to sleep (general anesthetic), and a breathing tube will be placed into your lungs during the procedure.  Several small incisions are made in your knee. Saline fluid is placed into one of the incisions to expand the knee and clear away  any blood in the knee.  Your health care provider will insert the arthroscope to examine the injured knee.  During arthroscopy, your health care provider may find a partial or complete tear in a ligament.  Tools can be inserted through the other incisions to repair the injured ligaments.  The incisions are then closed with absorbable stitches and covered with dressings. What happens after the procedure?  You will be taken to the recovery area where you will be monitored.  When you are awake, stable, and taking fluids  without problems, you will be allowed to go home. This information is not intended to replace advice given to you by your health care provider. Make sure you discuss any questions you have with your health care provider. Document Released: 02/16/2000 Document Revised: 07/27/2015 Document Reviewed: 09/30/2012 Elsevier Interactive Patient Education  2017 [employer].  Arthroscopic Knee Ligament Repair, Care After This sheet gives you information about how to care for yourself after your procedure. Your health care provider may also give you more specific instructions. If you have problems or questions, contact your health care provider. What can I expect after the procedure? After the procedure, it is common to have:  Pain in your knee.  Bruising and swelling on your knee, calf, and ankle for 3-4 days.  Fatigue.  Follow these instructions at home: If you have a brace or immobilizer:  Wear the brace or immobilizer as told by your health care provider. Remove it only as told by your health care provider.  Loosen the splint or immobilizer if your toes tingle, become numb, or turn cold and blue.  Keep the brace or immobilizer clean. Bathing  Do not take baths, swim, or use a hot tub until your health care provider approves. Ask your health care provider if you can take showers.  Keep your bandage (dressing) dry until your health care provider says that it can be removed. Cover it and your brace or immobilizer with a watertight covering when you take a shower. Incision care  Follow instructions from your health care provider about how to take care of your incision. Make sure you: ? Wash your hands with soap and water before you change your bandage (dressing). If soap and water are not available, use hand sanitizer. ? Change your dressing as told by your health care provider. ? Leave stitches (sutures), skin glue, or adhesive strips in place. These skin closures may need to stay in place  for 2 weeks or longer. If adhesive strip edges start to loosen and curl up, you may trim the loose edges. Do not remove adhesive strips completely unless your health care provider tells you to do that.  Check your incision area every day for signs of infection. Check for: ? More redness, swelling, or pain. ? More fluid or blood. ? Warmth. ? Pus or a bad smell. Managing pain, stiffness, and swelling  If directed, put ice on the affected area. ? If you have a removable brace or immobilizer, remove it as told by your health care provider. ? Put ice in a plastic bag. ? Place a towel between your skin and the bag or between your brace or immobilizer and the bag. ? Leave the ice on for 20 minutes, 2-3 times a day.  Move your toes often to avoid stiffness and to lessen swelling.  Raise (elevate) the injured area above the level of your heart while you are sitting or lying down. Driving  Do not drive until your  health care provider approves. If you have a brace or immobilizer on your leg, ask your health care provider when it is safe for you to drive.  Do not drive or use heavy machinery while taking prescription pain medicine. Activity  Rest as directed. Ask your health care provider what activities are safe for you.  Do physical therapy exercises as told by your health care provider. Physical therapy will help you regain strength and motion in your knee.  Follow instructions from your health care provider about: ? When you may start motion exercises. ? When you may start riding a stationary bike and doing other low-impact activities. ? When you may start to jog and do other high-impact activities. Safety  Do not use the injured limb to support your body weight until your health care provider says that you can. Use crutches as told by your health care provider. General instructions  Do not use any products that contain nicotine or tobacco, such as cigarettes and e-cigarettes. These can  delay bone healing. If you need help quitting, ask your health care provider.  To prevent or treat constipation while you are taking prescription pain medicine, your health care provider may recommend that you: ? Drink enough fluid to keep your urine clear or pale yellow. ? Take over-the-counter or prescription medicines. ? Eat foods that are high in fiber, such as fresh fruits and vegetables, whole grains, and beans. ? Limit foods that are high in fat and processed sugars, such as fried and sweet foods.  Take over-the-counter and prescription medicines only as told by your health care provider.  Keep all follow-up visits as told by your health care provider. This is important. Contact a health care provider if:  You have more redness, swelling, or pain around an incision.  You have more fluid or blood coming from an incision.  Your incision feels warm to the touch.  You have a fever.  You have pain or swelling in your knee, and it gets worse.  You have pain that does not get better with medicine. Get help right away if:  You have trouble breathing.  You have pus or a bad smell coming from an incision.  You have numbness and tingling near the knee joint. Summary  After the procedure, it is common to have knee pain with bruising and swelling on your knee, calf, and ankle.  Icing your knee and raising your leg above the level of your heart will help control the pain and the swelling.  Do physical therapy exercises as told by your health care provider. Physical therapy will help you regain strength and motion in your knee. This information is not intended to replace advice given to you by your health care provider. Make sure you discuss any questions you have with your health care provider. Document Released: 12/09/2012 Document Revised: 02/13/2016 Document Reviewed: 02/13/2016 Elsevier Interactive Patient Education  2017 [place] Anesthesia, Adult General  anesthesia is the use of medicines to make a person "go to sleep" (be unconscious) for a medical procedure. General anesthesia is often recommended when a procedure:  Is long.  Requires you to be still or in an unusual position.  Is major and can cause you to lose blood.  Is impossible to do without general anesthesia.  The medicines used for general anesthesia are called general anesthetics. In addition to making you sleep, the medicines:  Prevent pain.  Control your blood pressure.  Relax your muscles.  Tell a  health care provider about:  Any allergies you have.  All medicines you are taking, including vitamins, herbs, eye drops, creams, and over-the-counter medicines.  Any problems you or family members have had with anesthetic medicines.  Types of anesthetics you have had in the past.  Any bleeding disorders you have.  Any surgeries you have had.  Any medical conditions you have.  Any history of heart or lung conditions, such as heart failure, sleep apnea, or chronic obstructive pulmonary disease (COPD).  Whether you are pregnant or may be pregnant.  Whether you use tobacco, alcohol, marijuana, or street drugs.  Any history of Armed forces logistics/support/administrative officer.  Any history of depression or anxiety. What are the risks? Generally, this is a safe procedure. However, problems may occur, including:  Allergic reaction to anesthetics.  Lung and heart problems.  Inhaling food or liquids from your stomach into your lungs (aspiration).  Injury to nerves.  Waking up during your procedure and being unable to move (rare).  Extreme agitation or a state of mental confusion (delirium) when you wake up from the anesthetic.  Air in the bloodstream, which can lead to stroke.  These problems are more likely to develop if you are having a major surgery or if you have an advanced medical condition. You can prevent some of these complications by answering all of your health care provider's  questions thoroughly and by following all pre-procedure instructions. General anesthesia can cause side effects, including:  Nausea or vomiting  A sore throat from the breathing tube.  Feeling cold or shivery.  Feeling tired, washed out, or achy.  Sleepiness or drowsiness.  Confusion or agitation.  What happens before the procedure? Staying hydrated Follow instructions from your health care provider about hydration, which may include:  Up to 2 hours before the procedure - you may continue to drink clear liquids, such as water, clear fruit juice, black coffee, and plain tea.  Eating and drinking restrictions Follow instructions from your health care provider about eating and drinking, which may include:  8 hours before the procedure - stop eating heavy meals or foods such as meat, fried foods, or fatty foods.  6 hours before the procedure - stop eating light meals or foods, such as toast or cereal.  6 hours before the procedure - stop drinking milk or drinks that contain milk.  2 hours before the procedure - stop drinking clear liquids.  Medicines  Ask your health care provider about: ? Changing or stopping your regular medicines. This is especially important if you are taking diabetes medicines or blood thinners. ? Taking medicines such as aspirin and ibuprofen. These medicines can thin your blood. Do not take these medicines before your procedure if your health care provider instructs you not to. ? Taking new dietary supplements or medicines. Do not take these during the week before your procedure unless your health care provider approves them.  If you are told to take a medicine or to continue taking a medicine on the day of the procedure, take the medicine with sips of water. General instructions   Ask if you will be going home the same day, the following day, or after a longer hospital stay. ? Plan to have someone take you home. ? Plan to have someone stay with you  for the first 24 hours after you leave the hospital or clinic.  For 3-6 weeks before the procedure, try not to use any tobacco products, such as cigarettes, chewing tobacco, and e-cigarettes.  You  may brush your teeth on the morning of the procedure, but make sure to spit out the toothpaste. What happens during the procedure?  You will be given anesthetics through a mask and through an IV tube in one of your veins.  You may receive medicine to help you relax (sedative).  As soon as you are asleep, a breathing tube may be used to help you breathe.  An anesthesia specialist will stay with you throughout the procedure. He or she will help keep you comfortable and safe by continuing to give you medicines and adjusting the amount of medicine that you get. He or she will also watch your blood pressure, pulse, and oxygen levels to make sure that the anesthetics do not cause any problems.  If a breathing tube was used to help you breathe, it will be removed before you wake up. The procedure may vary among health care providers and hospitals. What happens after the procedure?  You will wake up, often slowly, after the procedure is complete, usually in a recovery area.  Your blood pressure, heart rate, breathing rate, and blood oxygen level will be monitored until the medicines you were given have worn off.  You may be given medicine to help you calm down if you feel anxious or agitated.  If you will be going home the same day, your health care provider may check to make sure you can stand, drink, and urinate.  Your health care providers will treat your pain and side effects before you go home.  Do not drive for 24 hours if you received a sedative.  You may: ? Feel nauseous and vomit. ? Have a sore throat. ? Have mental slowness. ? Feel cold or shivery. ? Feel sleepy. ? Feel tired. ? Feel sore or achy, even in parts of your body where you did not have surgery. This information is not  intended to replace advice given to you by your health care provider. Make sure you discuss any questions you have with your health care provider. Document Released: 05/28/2007 Document Revised: 08/01/2015 Document Reviewed: 02/02/2015 Elsevier Interactive Patient Education  2018 [place] Anesthesia, Adult, Care After These instructions provide you with information about caring for yourself after your procedure. Your health care provider may also give you more specific instructions. Your treatment has been planned according to current medical practices, but problems sometimes occur. Call your health care provider if you have any problems or questions after your procedure. What can I expect after the procedure? After the procedure, it is common to have:  Vomiting.  A sore throat.  Mental slowness.  It is common to feel:  Nauseous.  Cold or shivery.  Sleepy.  Tired.  Sore or achy, even in parts of your body where you did not have surgery.  Follow these instructions at home: For at least 24 hours after the procedure:  Do not: ? Participate in activities where you could fall or become injured. ? Drive. ? Use heavy machinery. ? Drink alcohol. ? Take sleeping pills or medicines that cause drowsiness. ? Make important decisions or sign legal documents. ? Take care of children on your own.  Rest. Eating and drinking  If you vomit, drink water, juice, or soup when you can drink without vomiting.  Drink enough fluid to keep your urine clear or pale yellow.  Make sure you have little or no nausea before eating solid foods.  Follow the diet recommended by your health care provider. General instructions  Have a responsible adult stay with you until you are awake and alert.  Return to your normal activities as told by your health care provider. Ask your health care provider what activities are safe for you.  Take over-the-counter and prescription medicines only  as told by your health care provider.  If you smoke, do not smoke without supervision.  Keep all follow-up visits as told by your health care provider. This is important. Contact a health care provider if:  You continue to have nausea or vomiting at home, and medicines are not helpful.  You cannot drink fluids or start eating again.  You cannot urinate after 8-12 hours.  You develop a skin rash.  You have fever.  You have increasing redness at the site of your procedure. Get help right away if:  You have difficulty breathing.  You have chest pain.  You have unexpected bleeding.  You feel that you are having a life-threatening or urgent problem. This information is not intended to replace advice given to you by your health care provider. Make sure you discuss any questions you have with your health care provider. Document Released: 05/27/2000 Document Revised: 07/24/2015 Document Reviewed: 02/02/2015 Elsevier Interactive Patient Education  [employer].

## 2017-04-22 ENCOUNTER — Encounter (HOSPITAL_COMMUNITY): Payer: Self-pay

## 2017-04-22 ENCOUNTER — Other Ambulatory Visit: Payer: Self-pay

## 2017-04-22 ENCOUNTER — Encounter (HOSPITAL_COMMUNITY)
Admission: RE | Admit: 2017-04-22 | Discharge: 2017-04-22 | Disposition: A | Discharge status: Home / Self Care | Payer: BLUE CROSS/BLUE SHIELD | Source: Ambulatory Visit | Attending: Orthopedic Surgery | Admitting: Orthopedic Surgery

## 2017-04-22 DIAGNOSIS — G8929 Other chronic pain: Secondary | ICD-10-CM | POA: Diagnosis not present

## 2017-04-22 DIAGNOSIS — M1712 Unilateral primary osteoarthritis, left knee: Secondary | ICD-10-CM | POA: Diagnosis not present

## 2017-04-22 DIAGNOSIS — M94262 Chondromalacia, left knee: Secondary | ICD-10-CM | POA: Diagnosis not present

## 2017-04-22 DIAGNOSIS — M25762 Osteophyte, left knee: Secondary | ICD-10-CM | POA: Diagnosis not present

## 2017-04-22 DIAGNOSIS — M1990 Unspecified osteoarthritis, unspecified site: Secondary | ICD-10-CM | POA: Diagnosis not present

## 2017-04-22 DIAGNOSIS — X58XXXA Exposure to other specified factors, initial encounter: Secondary | ICD-10-CM | POA: Diagnosis not present

## 2017-04-22 DIAGNOSIS — I10 Essential (primary) hypertension: Secondary | ICD-10-CM | POA: Diagnosis not present

## 2017-04-22 DIAGNOSIS — S83242A Other tear of medial meniscus, current injury, left knee, initial encounter: Secondary | ICD-10-CM | POA: Diagnosis not present

## 2017-04-22 DIAGNOSIS — F1910 Other psychoactive substance abuse, uncomplicated: Secondary | ICD-10-CM | POA: Diagnosis not present

## 2017-04-22 DIAGNOSIS — M659 Synovitis and tenosynovitis, unspecified: Secondary | ICD-10-CM | POA: Diagnosis not present

## 2017-04-22 DIAGNOSIS — Z87891 Personal history of nicotine dependence: Secondary | ICD-10-CM | POA: Diagnosis not present

## 2017-04-22 DIAGNOSIS — Y939 Activity, unspecified: Secondary | ICD-10-CM | POA: Diagnosis not present

## 2017-04-22 LAB — BASIC METABOLIC PANEL
Anion gap: 9 (ref 5–15)
BUN: 15 mg/dL (ref 6–20)
CALCIUM: 9.5 mg/dL (ref 8.9–10.3)
CO2: 24 mmol/L (ref 22–32)
CREATININE: 0.7 mg/dL (ref 0.61–1.24)
Chloride: 104 mmol/L (ref 101–111)
GFR calc Af Amer: >60 mL/min (ref >60)
Glucose, Bld: 103 mg/dL — ABNORMAL HIGH (ref 65–99)
Potassium: 3.8 mmol/L (ref 3.5–5.1)
SODIUM: 137 mmol/L (ref 135–145)

## 2017-04-22 LAB — CBC WITH DIFFERENTIAL/PLATELET
BASOS ABS: 0 10*3/uL (ref 0.0–0.1)
BASOS PCT: 1 %
Eosinophils Absolute: 0.2 10*3/uL (ref 0.0–0.7)
Eosinophils Relative: 4 %
HCT: 40.1 % (ref 39.0–52.0)
HEMOGLOBIN: 13.1 g/dL (ref 13.0–17.0)
Lymphocytes Relative: 21 %
Lymphs Abs: 1.2 10*3/uL (ref 0.7–4.0)
MCH: 27.8 pg (ref 26.0–34.0)
MCHC: 32.7 g/dL (ref 30.0–36.0)
MCV: 85.1 fL (ref 78.0–100.0)
Monocytes Absolute: 0.4 10*3/uL (ref 0.1–1.0)
Monocytes Relative: 7 %
NEUTROS PCT: 67 %
Neutro Abs: 4 10*3/uL (ref 1.7–7.7)
Platelets: 258 10*3/uL (ref 150–400)
RBC: 4.71 MIL/uL (ref 4.22–5.81)
RDW: 12.3 % (ref 11.5–15.5)
WBC: 5.9 10*3/uL (ref 4.0–10.5)

## 2017-04-22 LAB — SURGICAL PCR SCREEN
MRSA, PCR: NEGATIVE
STAPHYLOCOCCUS AUREUS: POSITIVE — AB

## 2017-04-22 LAB — RAPID URINE DRUG SCREEN, HOSP PERFORMED
AMPHETAMINES: NOT DETECTED
Barbiturates: NOT DETECTED
Benzodiazepines: NOT DETECTED
COCAINE: NOT DETECTED
Opiates: NOT DETECTED
TETRAHYDROCANNABINOL: NOT DETECTED

## 2017-04-23 NOTE — H&P (Signed)
PRE-OP       Chief Complaint  Patient presents with  . Knee Pain      left      52 year old male presents for evaluation of his left knee   He was seen by Dr. 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 Arthritis    . Chronic back pain    . Hypertension           Past Surgical History:  Procedure Laterality Date  . COLONOSCOPY N/A 04/17/2015    Procedure: COLONOSCOPY;  Surgeon: Danie Binder, MD;  Location: [facility];  Service: Endoscopy;  Laterality: N/A;  1:00 PM    Social History         Tobacco Use  . Smoking status: Former Smoker      Types: Cigarettes  . Smokeless tobacco: Never Used  Substance Use Topics  . Alcohol use: No  . Drug use: Yes      Types: Cocaine, Marijuana      Comment: former         Family History  Problem Relation Age of Onset  . Cancer Father    . Arthritis Father    . Arthritis Maternal Aunt    . Arthritis Maternal Uncle    . Arthritis Paternal Aunt        Current Outpatient Medications:  .  aspirin 81 MG tablet, Take 81 mg by mouth daily., Disp: , Rfl:    No Known Allergies     BP 138/82   Pulse 88   Ht 6\' 2"  (1.88 m)   Wt 267 lb (121.1 kg)   BMI 34.28 kg/m      Physical Exam  Constitutional: He is oriented to person, place, and time. He appears well-developed and well-nourished.  Vital signs have been reviewed and are stable. Gen. appearance the patient is well-developed and well-nourished with normal grooming and hygiene.     Musculoskeletal:       Legs:    Neurological: He is alert and oriented to person, place, and time.  Skin: Skin is warm and dry. No erythema.  Psychiatric: He has a normal mood and affect.  Vitals reviewed.   Imaging   The plain film shows a varus aligned knee with very little joint space remaining between the medial femoral condyle and tibial plateau no significant osteophyte formation, oblique view X shows exacerbated loss of joint space medially, there is no patellofemoral view    [id number]: 1. Severe advanced osteoarthritis of the medial femorotibial compartment. 2. Degeneration of the posterior horn and body of the medial meniscus with a radial tear of free edge of the body of the medial meniscus.     Electronically Signed   By: Kathreen Devoid   On: 03/26/2017 15:58         Encounter Diagnoses  Name Primary?  . Derangement of posterior horn of medial meniscus of left knee Yes  .  Primary osteoarthritis of left knee        Plan I discussed with the patient potential options including knee replacement no surgery or arthroscopy   He would like to try arthroscopy first he understands that he would probably need a knee replacement and we cannot determine how long it will take before he needs the knee replacement.   The procedure has been fully reviewed with the patient; The risks and benefits of surgery have been discussed and explained and understood. Alternative treatment has also been reviewed, questions were encouraged and answered. The postoperative plan is also been reviewed.     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

## 2017-04-24 ENCOUNTER — Ambulatory Visit (HOSPITAL_COMMUNITY): Payer: BLUE CROSS/BLUE SHIELD | Admitting: Anesthesiology

## 2017-04-24 ENCOUNTER — Encounter (HOSPITAL_COMMUNITY): Payer: Self-pay | Admitting: *Deleted

## 2017-04-24 ENCOUNTER — Encounter (HOSPITAL_COMMUNITY)
Admission: RE | Disposition: A | Discharge status: Home / Self Care | Payer: Self-pay | Source: Ambulatory Visit | Attending: Orthopedic Surgery

## 2017-04-24 ENCOUNTER — Ambulatory Visit (HOSPITAL_COMMUNITY)
Admission: RE | Admit: 2017-04-24 | Discharge: 2017-04-24 | Disposition: A | Discharge status: Home / Self Care | Payer: BLUE CROSS/BLUE SHIELD | Source: Ambulatory Visit | Attending: Orthopedic Surgery | Admitting: Orthopedic Surgery

## 2017-04-24 DIAGNOSIS — M1712 Unilateral primary osteoarthritis, left knee: Secondary | ICD-10-CM | POA: Diagnosis not present

## 2017-04-24 DIAGNOSIS — F1910 Other psychoactive substance abuse, uncomplicated: Secondary | ICD-10-CM | POA: Insufficient documentation

## 2017-04-24 DIAGNOSIS — Z87891 Personal history of nicotine dependence: Secondary | ICD-10-CM | POA: Insufficient documentation

## 2017-04-24 DIAGNOSIS — M94262 Chondromalacia, left knee: Secondary | ICD-10-CM | POA: Insufficient documentation

## 2017-04-24 DIAGNOSIS — M23322 Other meniscus derangements, posterior horn of medial meniscus, left knee: Secondary | ICD-10-CM

## 2017-04-24 DIAGNOSIS — M659 Synovitis and tenosynovitis, unspecified: Secondary | ICD-10-CM | POA: Insufficient documentation

## 2017-04-24 DIAGNOSIS — I10 Essential (primary) hypertension: Secondary | ICD-10-CM | POA: Insufficient documentation

## 2017-04-24 DIAGNOSIS — G8929 Other chronic pain: Secondary | ICD-10-CM | POA: Insufficient documentation

## 2017-04-24 DIAGNOSIS — S83242A Other tear of medial meniscus, current injury, left knee, initial encounter: Secondary | ICD-10-CM | POA: Insufficient documentation

## 2017-04-24 DIAGNOSIS — M1990 Unspecified osteoarthritis, unspecified site: Secondary | ICD-10-CM | POA: Insufficient documentation

## 2017-04-24 DIAGNOSIS — Y939 Activity, unspecified: Secondary | ICD-10-CM | POA: Insufficient documentation

## 2017-04-24 DIAGNOSIS — M25762 Osteophyte, left knee: Secondary | ICD-10-CM | POA: Diagnosis not present

## 2017-04-24 DIAGNOSIS — Z9889 Other specified postprocedural states: Secondary | ICD-10-CM

## 2017-04-24 DIAGNOSIS — X58XXXA Exposure to other specified factors, initial encounter: Secondary | ICD-10-CM | POA: Insufficient documentation

## 2017-04-24 HISTORY — PX: KNEE ARTHROSCOPY WITH MEDIAL MENISECTOMY: SHX5651

## 2017-04-24 SURGERY — ARTHROSCOPY, KNEE, WITH MEDIAL MENISCECTOMY
Anesthesia: General | Site: Knee | Laterality: Left

## 2017-04-24 MED ORDER — DEXAMETHASONE SODIUM PHOSPHATE 4 MG/ML IJ SOLN
4.0000 mg | INTRAMUSCULAR | Status: AC
  Administered 2017-04-24: 4 mg via INTRAVENOUS

## 2017-04-24 MED ORDER — POVIDONE-IODINE 10 % EX SWAB: 2.0000 "application " | Freq: Once | CUTANEOUS | Status: DC

## 2017-04-24 MED ORDER — ONDANSETRON HCL 4 MG/2ML IJ SOLN
4.0000 mg | Freq: Once | INTRAMUSCULAR | Status: AC
  Administered 2017-04-24: 4 mg via INTRAVENOUS

## 2017-04-24 MED ORDER — FENTANYL CITRATE (PF) 100 MCG/2ML IJ SOLN
25.0000 ug | Freq: Once | INTRAMUSCULAR | Status: AC
  Administered 2017-04-24: 25 ug via INTRAVENOUS

## 2017-04-24 MED ORDER — HYDROCODONE-ACETAMINOPHEN 7.5-325 MG PO TABS: 1.0000 | ORAL_TABLET | ORAL | 0 refills | Status: DC | PRN

## 2017-04-24 MED ORDER — FENTANYL CITRATE (PF) 100 MCG/2ML IJ SOLN
INTRAMUSCULAR | Status: AC
  Filled 2017-04-24: qty 4

## 2017-04-24 MED ORDER — DEXAMETHASONE SODIUM PHOSPHATE 4 MG/ML IJ SOLN
INTRAMUSCULAR | Status: AC
  Filled 2017-04-24: qty 1

## 2017-04-24 MED ORDER — LIDOCAINE HCL 1 % IJ SOLN
INTRAMUSCULAR | Status: DC | PRN
  Administered 2017-04-24: 40 mg via INTRADERMAL

## 2017-04-24 MED ORDER — LACTATED RINGERS IV SOLN
INTRAVENOUS | Status: DC
  Administered 2017-04-24: 10:00:00 via INTRAVENOUS
  Administered 2017-04-24: 1000 mL via INTRAVENOUS

## 2017-04-24 MED ORDER — BUPIVACAINE-EPINEPHRINE (PF) 0.5% -1:200000 IJ SOLN
INTRAMUSCULAR | Status: DC | PRN
  Administered 2017-04-24: 60 mL via PERINEURAL

## 2017-04-24 MED ORDER — CEFAZOLIN SODIUM-DEXTROSE 2-4 GM/100ML-% IV SOLN
2.0000 g | INTRAVENOUS | Status: AC
  Administered 2017-04-24: 2 g via INTRAVENOUS
  Filled 2017-04-24: qty 100

## 2017-04-24 MED ORDER — PROPOFOL 10 MG/ML IV BOLUS
INTRAVENOUS | Status: AC
  Filled 2017-04-24: qty 20

## 2017-04-24 MED ORDER — MIDAZOLAM HCL 2 MG/2ML IJ SOLN
1.0000 mg | INTRAMUSCULAR | Status: AC
  Administered 2017-04-24: 2 mg via INTRAVENOUS

## 2017-04-24 MED ORDER — FENTANYL CITRATE (PF) 100 MCG/2ML IJ SOLN
INTRAMUSCULAR | Status: DC | PRN
  Administered 2017-04-24 (×2): 50 ug via INTRAVENOUS
  Administered 2017-04-24: 25 ug via INTRAVENOUS

## 2017-04-24 MED ORDER — FENTANYL CITRATE (PF) 100 MCG/2ML IJ SOLN
INTRAMUSCULAR | Status: AC
  Filled 2017-04-24: qty 2

## 2017-04-24 MED ORDER — MIDAZOLAM HCL 2 MG/2ML IJ SOLN
INTRAMUSCULAR | Status: AC
  Filled 2017-04-24: qty 2

## 2017-04-24 MED ORDER — FENTANYL CITRATE (PF) 100 MCG/2ML IJ SOLN
25.0000 ug | INTRAMUSCULAR | Status: DC | PRN
  Administered 2017-04-24: 50 ug via INTRAVENOUS
  Filled 2017-04-24: qty 2

## 2017-04-24 MED ORDER — CHLORHEXIDINE GLUCONATE 4 % EX LIQD: 60.0000 mL | Freq: Once | CUTANEOUS | Status: DC

## 2017-04-24 MED ORDER — PROPOFOL 10 MG/ML IV BOLUS
INTRAVENOUS | Status: DC | PRN
  Administered 2017-04-24: 200 mg via INTRAVENOUS

## 2017-04-24 MED ORDER — BUPIVACAINE-EPINEPHRINE (PF) 0.5% -1:200000 IJ SOLN
INTRAMUSCULAR | Status: AC
  Filled 2017-04-24: qty 60

## 2017-04-24 MED ORDER — ONDANSETRON HCL 4 MG/2ML IJ SOLN
INTRAMUSCULAR | Status: AC
  Filled 2017-04-24: qty 2

## 2017-04-24 MED ORDER — SODIUM CHLORIDE 0.9 % IR SOLN
Status: DC | PRN
  Administered 2017-04-24 (×3): 3000 mL

## 2017-04-24 SURGICAL SUPPLY — 53 items
ARTHROWAND PARAGON T2 (SURGICAL WAND)
BAG HAMPER (MISCELLANEOUS) ×2 IMPLANT
BANDAGE ELASTIC 6 LF NS (GAUZE/BANDAGES/DRESSINGS) ×2 IMPLANT
BLADE AGGRESSIVE PLUS 4.0 (BLADE) ×2 IMPLANT
BLADE SURG SZ11 CARB STEEL (BLADE) ×2 IMPLANT
BNDG CMPR MED 5X6 ELC HKLP NS (GAUZE/BANDAGES/DRESSINGS) ×1
CHLORAPREP W/TINT 26ML (MISCELLANEOUS) ×2 IMPLANT
CLOTH BEACON ORANGE TIMEOUT ST (SAFETY) ×2 IMPLANT
COOLER CRYO IC GRAV AND TUBE (ORTHOPEDIC SUPPLIES) ×2 IMPLANT
CUFF CRYO KNEE18X23 MED (MISCELLANEOUS) ×1 IMPLANT
CUFF TOURNIQUET SINGLE 34IN LL (TOURNIQUET CUFF) ×1 IMPLANT
CUTTER ANGLED DBL BITE 4.5 (BURR) IMPLANT
DECANTER SPIKE VIAL GLASS SM (MISCELLANEOUS) ×4 IMPLANT
GAUZE SPONGE 4X4 12PLY STRL (GAUZE/BANDAGES/DRESSINGS) ×2 IMPLANT
GAUZE SPONGE 4X4 16PLY XRAY LF (GAUZE/BANDAGES/DRESSINGS) ×2 IMPLANT
GAUZE XEROFORM 5X9 LF (GAUZE/BANDAGES/DRESSINGS) ×2 IMPLANT
GLOVE BIOGEL PI IND STRL 7.0 (GLOVE) ×1 IMPLANT
GLOVE BIOGEL PI INDICATOR 7.0 (GLOVE) ×1
GLOVE SKINSENSE NS SZ8.0 LF (GLOVE) ×1
GLOVE SKINSENSE STRL SZ8.0 LF (GLOVE) ×1 IMPLANT
GLOVE SS N UNI LF 8.5 STRL (GLOVE) ×2 IMPLANT
GOWN STRL REUS W/ TWL LRG LVL3 (GOWN DISPOSABLE) ×1 IMPLANT
GOWN STRL REUS W/TWL LRG LVL3 (GOWN DISPOSABLE) ×2
GOWN STRL REUS W/TWL XL LVL3 (GOWN DISPOSABLE) ×2 IMPLANT
HLDR LEG FOAM (MISCELLANEOUS) ×1 IMPLANT
IV NS IRRIG 3000ML ARTHROMATIC (IV SOLUTION) ×5 IMPLANT
KIT BLADEGUARD II DBL (SET/KITS/TRAYS/PACK) ×2 IMPLANT
KIT ROOM TURNOVER AP CYSTO (KITS) ×2 IMPLANT
LEG HOLDER FOAM (MISCELLANEOUS) ×1
MANIFOLD NEPTUNE II (INSTRUMENTS) ×2 IMPLANT
MARKER SKIN DUAL TIP RULER LAB (MISCELLANEOUS) ×2 IMPLANT
NDL HYPO 18GX1.5 BLUNT FILL (NEEDLE) ×1 IMPLANT
NDL HYPO 21X1.5 SAFETY (NEEDLE) ×1 IMPLANT
NDL SPNL 18GX3.5 QUINCKE PK (NEEDLE) ×1 IMPLANT
NEEDLE HYPO 18GX1.5 BLUNT FILL (NEEDLE) ×2 IMPLANT
NEEDLE HYPO 21X1.5 SAFETY (NEEDLE) ×2 IMPLANT
NEEDLE SPNL 18GX3.5 QUINCKE PK (NEEDLE) ×2 IMPLANT
NS IRRIG 1000ML POUR BTL (IV SOLUTION) ×2 IMPLANT
PACK ARTHRO LIMB DRAPE STRL (MISCELLANEOUS) ×2 IMPLANT
PAD ABD 5X9 TENDERSORB (GAUZE/BANDAGES/DRESSINGS) ×2 IMPLANT
PAD ARMBOARD 7.5X6 YLW CONV (MISCELLANEOUS) ×2 IMPLANT
PADDING CAST COTTON 6X4 STRL (CAST SUPPLIES) ×2 IMPLANT
PADDING WEBRIL 6 STERILE (GAUZE/BANDAGES/DRESSINGS) ×1 IMPLANT
PROBE BIPOLAR 50 DEGREE SUCT (MISCELLANEOUS) ×1 IMPLANT
PROBE BIPOLAR ATHRO 135MM 90D (MISCELLANEOUS) IMPLANT
SET ARTHROSCOPY INST (INSTRUMENTS) ×2 IMPLANT
SET BASIN LINEN APH (SET/KITS/TRAYS/PACK) ×2 IMPLANT
SUT ETHILON 3 0 FSL (SUTURE) ×2 IMPLANT
SYR 30ML LL (SYRINGE) ×2 IMPLANT
SYRINGE 10CC LL (SYRINGE) ×2 IMPLANT
TUBE CONNECTING 12X1/4 (SUCTIONS) ×4 IMPLANT
TUBING ARTHRO INFLOW-ONLY STRL (TUBING) ×2 IMPLANT
WAND ARTHRO PARAGON T2 (SURGICAL WAND) IMPLANT

## 2017-04-24 NOTE — Interval H&P Note (Signed)
History and Physical Interval Note:  04/24/2017 7:26 AM   Luke Chavez  has presented today for surgery, with the diagnosis of Torn medial meniscus grade 4 osteoarthritis left knee  The various methods of treatment have been discussed with the patient and family. After consideration of risks, benefits and other options for treatment, the patient has consented to  Procedure(s): KNEE ARTHROSCOPY WITH MEDIAL MENISECTOMY (Left) as a surgical intervention .  The patient's history has been reviewed, patient examined, no change in status, stable for surgery.  I have reviewed the patient's chart and labs.  Questions were answered to the patient's satisfaction.     Arther Abbott

## 2017-04-24 NOTE — Transfer of Care (Signed)
Immediate Anesthesia Transfer of Care Note  Patient: Luke Chavez  Procedure(s) Performed: KNEE ARTHROSCOPY WITH MEDIAL MENISECTOMY (Left Knee)  Patient Location: PACU  Anesthesia Type:General  Level of Consciousness: awake  Airway & Oxygen Therapy: Patient Spontanous Breathing and Patient connected to face mask oxygen  Post-op Assessment: Report given to RN  Post vital signs: Reviewed and stable  Last Vitals:  Vitals:   04/24/17 0910 04/24/17 1025  BP: 130/87 123/82  Pulse:  77  Resp: 15 13  Temp:  (P) 36.9 C  SpO2:  (P) 100%    Last Pain:  Vitals:   04/24/17 0737  TempSrc: Oral      Patients Stated Pain Goal: 6 (52/17/47 1595)  Complications: No apparent anesthesia complications

## 2017-04-24 NOTE — Anesthesia Postprocedure Evaluation (Signed)
Anesthesia Post Note  Patient: Luke Chavez  Procedure(s) Performed: KNEE ARTHROSCOPY WITH MEDIAL MENISECTOMY (Left Knee)  Patient location during evaluation: PACU Anesthesia Type: General Level of consciousness: awake and alert and oriented Pain management: pain level controlled Vital Signs Assessment: post-procedure vital signs reviewed and stable Respiratory status: spontaneous breathing Cardiovascular status: blood pressure returned to baseline and stable Postop Assessment: no apparent nausea or vomiting Anesthetic complications: no     Last Vitals:  Vitals:   04/24/17 1045 04/24/17 1100  BP: 121/89 118/73  Pulse: 67 74  Resp: 12 (!) 8  Temp:    SpO2: 100% 95%    Last Pain:  Vitals:   04/24/17 1025  TempSrc:   PainSc: Asleep                 Navi Erber

## 2017-04-24 NOTE — Brief Op Note (Signed)
04/24/2017  10:14 AM  PATIENT:  Luke Chavez  52 y.o. male  PRE-OPERATIVE DIAGNOSIS:  Torn medial meniscus osteoarthritis left knee  POST-OPERATIVE DIAGNOSIS:  Torn medial meniscus osteoarthritis left knee  PROCEDURE:  Procedure(s): KNEE ARTHROSCOPY WITH MEDIAL MENISECTOMY (Left) - 29881  Findings at surgery  Torn medial meniscus medial compartment with grade 4 tibial and femoral diffuse changes medial compartment  Normal ACL and PCL, tibial spine osteophyte  Normal lateral meniscus  Grade III chondromalacia of the patella with moderate synovitis in the suprapatellar pouch  Knee arthroscopy dictation  The patient was identified in the preoperative holding area using 2 approved identification mechanisms. The chart was reviewed and updated. The surgical site was confirmed as left knee and marked with an indelible marker.  The patient was taken to the operating room for anesthesia. After successful general anesthesia, Ancef was used as IV antibiotics.  The patient was placed in the supine position with the (left leg) the operative extremity in an arthroscopic leg holder and the opposite extremity in a padded leg holder.  The timeout was executed.  A lateral portal was established with an 11 blade and the scope was introduced into the joint. A diagnostic arthroscopy was performed in circumferential manner examining the entire knee joint. A medial portal was established and the diagnostic arthroscopy was repeated using a probe to palpate intra-articular structures as they were encountered.   The medial meniscus was resected using a duckbill forceps. The meniscal fragments were removed with a motorized shaver. The meniscus was balanced with a combination of a motorized shaver and a 50 ConMed wand until a stable rim was obtained.   Loose cartilage in the patellofemoral joint was removed  The arthroscopic pump was placed on the wash mode and any excess debris was removed from the  joint using suction.  60 cc of Marcaine with epinephrine was injected through the arthroscope.  The portals were closed with 3-0 nylon suture.  A sterile bandage, Ace wrap and Cryo/Cuff was placed and the Cryo/Cuff was activated. The patient was taken to the recovery room in stable condition.  SURGEON:  Surgeon(s) and Role:    * Carole Civil, MD - Primary  PHYSICIAN ASSISTANT:   ASSISTANTS: none   ANESTHESIA:   general  EBL:  none   BLOOD ADMINISTERED:none  DRAINS: none   LOCAL MEDICATIONS USED:  MARCAINE     SPECIMEN:  No Specimen  DISPOSITION OF SPECIMEN:  N/A  COUNTS:  YES  TOURNIQUET:  * Missing tourniquet times found for documented tourniquets in log: [id number] *  DICTATION: .Viviann Spare Dictation  PLAN OF CARE: Discharge to home after PACU  PATIENT DISPOSITION:  PACU - hemodynamically stable.   Delay start of Pharmacological VTE agent (>24hrs) due to surgical blood loss or risk of bleeding: not applicable

## 2017-04-24 NOTE — Anesthesia Procedure Notes (Signed)
Procedure Name: LMA Insertion Date/Time: 04/24/2017 9:25 AM Performed by: Andree Elk, Fina Heizer A, CRNA Pre-anesthesia Checklist: Patient identified, Timeout performed, Emergency Drugs available, Suction available and Patient being monitored Patient Re-evaluated:Patient Re-evaluated prior to induction Oxygen Delivery Method: Circle system utilized Preoxygenation: Pre-oxygenation with 100% oxygen Induction Type: IV induction Ventilation: Mask ventilation without difficulty LMA: LMA inserted LMA Size: 5.0 Number of attempts: 1 Placement Confirmation: positive ETCO2 and breath sounds checked- equal and bilateral Tube secured with: Tape Dental Injury: Teeth and Oropharynx as per pre-operative assessment

## 2017-04-24 NOTE — Discharge Instructions (Signed)

## 2017-04-24 NOTE — Op Note (Signed)
04/24/2017  10:14 AM  PATIENT:  Luke Chavez  52 y.o. male  PRE-OPERATIVE DIAGNOSIS:  Torn medial meniscus osteoarthritis left knee  POST-OPERATIVE DIAGNOSIS:  Torn medial meniscus osteoarthritis left knee  PROCEDURE:  Procedure(s): KNEE ARTHROSCOPY WITH MEDIAL MENISECTOMY (Left) - 29881  Findings at surgery  Torn medial meniscus medial compartment with grade 4 tibial and femoral diffuse changes medial compartment  Normal ACL and PCL, tibial spine osteophyte  Normal lateral meniscus  Grade III chondromalacia of the patella with moderate synovitis in the suprapatellar pouch  Knee arthroscopy dictation  The patient was identified in the preoperative holding area using 2 approved identification mechanisms. The chart was reviewed and updated. The surgical site was confirmed as left knee and marked with an indelible marker.  The patient was taken to the operating room for anesthesia. After successful general anesthesia, Ancef was used as IV antibiotics.  The patient was placed in the supine position with the (left leg) the operative extremity in an arthroscopic leg holder and the opposite extremity in a padded leg holder.  The timeout was executed.  A lateral portal was established with an 11 blade and the scope was introduced into the joint. A diagnostic arthroscopy was performed in circumferential manner examining the entire knee joint. A medial portal was established and the diagnostic arthroscopy was repeated using a probe to palpate intra-articular structures as they were encountered.   The medial meniscus was resected using a duckbill forceps. The meniscal fragments were removed with a motorized shaver. The meniscus was balanced with a combination of a motorized shaver and a 50 ConMed wand until a stable rim was obtained.   Loose cartilage in the patellofemoral joint was removed  The arthroscopic pump was placed on the wash mode and any excess debris was removed from the  joint using suction.  60 cc of Marcaine with epinephrine was injected through the arthroscope.  The portals were closed with 3-0 nylon suture.  A sterile bandage, Ace wrap and Cryo/Cuff was placed and the Cryo/Cuff was activated. The patient was taken to the recovery room in stable condition.  SURGEON:  Surgeon(s) and Role:    * Carole Civil, MD - Primary  PHYSICIAN ASSISTANT:   ASSISTANTS: none   ANESTHESIA:   general  EBL:  none   BLOOD ADMINISTERED:none  DRAINS: none   LOCAL MEDICATIONS USED:  MARCAINE     SPECIMEN:  No Specimen  DISPOSITION OF SPECIMEN:  N/A  COUNTS:  YES  TOURNIQUET:  * Missing tourniquet times found for documented tourniquets in log: [id number] *  DICTATION: .Viviann Spare Dictation  PLAN OF CARE: Discharge to home after PACU  PATIENT DISPOSITION:  PACU - hemodynamically stable.   Delay start of Pharmacological VTE agent (>24hrs) due to surgical blood loss or risk of bleeding: not applicable

## 2017-04-24 NOTE — Anesthesia Preprocedure Evaluation (Signed)
Anesthesia Evaluation  Patient identified by MRN, date of birth, ID band Patient awake    Reviewed: Allergy & Precautions, NPO status , Patient's Chart, lab work & pertinent test results  Airway Mallampati: II  TM Distance: >3 FB Neck ROM: Full    Dental  (+) Edentulous Lower, Edentulous Upper   Pulmonary former smoker,    breath sounds clear to auscultation       Cardiovascular hypertension (off meds),  Rhythm:Regular Rate:Normal     Neuro/Psych negative neurological ROS  negative psych ROS   GI/Hepatic neg GERD  ,(+)     substance abuse (neg tox screen)  cocaine use and marijuana use,   Endo/Other  negative endocrine ROS  Renal/GU negative Renal ROS     Musculoskeletal  (+) Arthritis ,   Abdominal   Peds  Hematology negative hematology ROS (+)   Anesthesia Other Findings   Reproductive/Obstetrics                             Anesthesia Physical Anesthesia Plan  ASA: II  Anesthesia Plan: General   Post-op Pain Management:    Induction: Intravenous  PONV Risk Score and Plan:   Airway Management Planned: LMA  Additional Equipment:   Intra-op Plan:   Post-operative Plan: Extubation in OR  Informed Consent: I have reviewed the patients History and Physical, chart, labs and discussed the procedure including the risks, benefits and alternatives for the proposed anesthesia with the patient or authorized representative who has indicated his/her understanding and acceptance.     Plan Discussed with:   Anesthesia Plan Comments:         Anesthesia Quick Evaluation

## 2017-04-25 ENCOUNTER — Encounter (HOSPITAL_COMMUNITY): Payer: Self-pay | Admitting: Orthopedic Surgery

## 2017-04-28 ENCOUNTER — Ambulatory Visit (INDEPENDENT_AMBULATORY_CARE_PROVIDER_SITE_OTHER): Payer: BLUE CROSS/BLUE SHIELD | Admitting: Orthopedic Surgery

## 2017-04-28 VITALS — BP 113/77 | HR 112 | Ht 74.0 in | Wt 267.0 lb

## 2017-04-28 DIAGNOSIS — Z9889 Other specified postprocedural states: Secondary | ICD-10-CM

## 2017-04-28 NOTE — Progress Notes (Signed)
POST OP VISIT   Chief Complaint  Patient presents with  . Follow-up    Recheck on left knee, DOs 04-24-17.    Encounter Diagnosis  Name Primary?  . S/P left knee arthroscopy 04/24/17 Yes   PRE-OPERATIVE DIAGNOSIS:  Torn medial meniscus osteoarthritis left knee   POST-OPERATIVE DIAGNOSIS:  Torn medial meniscus osteoarthritis left knee   PROCEDURE:  Procedure(s): KNEE ARTHROSCOPY WITH MEDIAL MENISECTOMY (Left) - 29881   Findings at surgery   Torn medial meniscus medial compartment with grade 4 tibial and femoral diffuse changes medial compartment   Normal ACL and PCL, tibial spine osteophyte   Normal lateral meniscus   Grade III chondromalacia of the patella with moderate synovitis in the suprapatellar pouch   Current Outpatient Medications:  .  diclofenac sodium (VOLTAREN) 1 % GEL, Apply 2-4 g topically 4 (four) times daily as needed (for arthritis pain.)., Disp: , Rfl:  .  HYDROcodone-acetaminophen (NORCO) 7.5-325 MG tablet, Take 1 tablet by mouth every 4 (four) hours as needed for moderate pain., Disp: 42 tablet, Rfl: 0 .  ibuprofen (ADVIL,MOTRIN) 200 MG tablet, Take 400-800 mg by mouth every 8 (eight) hours as needed (for pain.)., Disp: , Rfl:   THERE ARE NO COMPLAINTS   THE WOUND LOOKS CLEAN AND THERE ARE NO SIGNS OF ERYTHEMA  NEUROVASCULAR EXAM IS NORMAL   EVERYTHING LOOKS GOOD   START PEP HEP  FU WILL BE SCHEDULED   Expected return to work date is March 21, follow-up in the office March 12

## 2017-05-13 ENCOUNTER — Encounter: Payer: Self-pay | Admitting: Orthopedic Surgery

## 2017-05-13 ENCOUNTER — Ambulatory Visit (INDEPENDENT_AMBULATORY_CARE_PROVIDER_SITE_OTHER): Payer: BLUE CROSS/BLUE SHIELD | Admitting: Orthopedic Surgery

## 2017-05-13 VITALS — BP 135/88 | HR 83 | Ht 74.0 in | Wt 261.0 lb

## 2017-05-13 DIAGNOSIS — M1712 Unilateral primary osteoarthritis, left knee: Secondary | ICD-10-CM

## 2017-05-13 DIAGNOSIS — Z9889 Other specified postprocedural states: Secondary | ICD-10-CM

## 2017-05-13 DIAGNOSIS — M23322 Other meniscus derangements, posterior horn of medial meniscus, left knee: Secondary | ICD-10-CM

## 2017-05-13 DIAGNOSIS — M23329 Other meniscus derangements, posterior horn of medial meniscus, unspecified knee: Secondary | ICD-10-CM | POA: Insufficient documentation

## 2017-05-13 NOTE — Patient Instructions (Addendum)
Return to work in 3 weeks   Use hydrocodone sparingly   Use tylenol or ibuprofen for pain   increase activity slowly continue exercise program at home

## 2017-05-13 NOTE — Progress Notes (Signed)
POST OP VISIT   Chief Complaint  Patient presents with  . Post-op Follow-up    s/p left knee arthroscopy 04/24/17    Encounter Diagnoses  Name Primary?  . S/P left knee arthroscopy 04/24/17 Yes  . Primary osteoarthritis of left knee   . Derangement of posterior horn of medial meniscus of left knee    PRE-OPERATIVE DIAGNOSIS:  Torn medial meniscus osteoarthritis left knee   POST-OPERATIVE DIAGNOSIS:  Torn medial meniscus osteoarthritis left knee   PROCEDURE:  Procedure(s): KNEE ARTHROSCOPY WITH MEDIAL MENISECTOMY (Left) - 73668   Findings at surgery   Torn medial meniscus medial compartment with grade 4 tibial and femoral diffuse changes medial compartment   Normal ACL and PCL, tibial spine osteophyte   Normal lateral meniscus   Grade III chondromalacia of the patella with moderate synovitis in the suprapatellar pouch   So far doing well improving.  He will need about 3 more weeks before he can return to work we will refill his hydrocodone and he will continue with ibuprofen as needed for pain after that.  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

## 2017-05-14 DIAGNOSIS — M23322 Other meniscus derangements, posterior horn of medial meniscus, left knee: Secondary | ICD-10-CM | POA: Diagnosis not present

## 2017-05-14 DIAGNOSIS — M1712 Unilateral primary osteoarthritis, left knee: Secondary | ICD-10-CM | POA: Diagnosis not present

## 2017-05-23 ENCOUNTER — Other Ambulatory Visit: Payer: Self-pay | Admitting: Orthopedic Surgery

## 2017-05-23 DIAGNOSIS — Z9889 Other specified postprocedural states: Secondary | ICD-10-CM

## 2017-05-23 DIAGNOSIS — G8929 Other chronic pain: Secondary | ICD-10-CM

## 2017-05-23 DIAGNOSIS — M25562 Pain in left knee: Secondary | ICD-10-CM

## 2017-05-23 DIAGNOSIS — M23322 Other meniscus derangements, posterior horn of medial meniscus, left knee: Secondary | ICD-10-CM

## 2017-05-23 MED ORDER — HYDROCODONE-ACETAMINOPHEN 5-325 MG PO TABS: 1.0000 | ORAL_TABLET | Freq: Four times a day (QID) | ORAL | 0 refills | Status: DC | PRN

## 2017-05-23 NOTE — Progress Notes (Signed)
210

## 2017-07-17 DIAGNOSIS — J019 Acute sinusitis, unspecified: Secondary | ICD-10-CM | POA: Diagnosis not present

## 2017-07-17 DIAGNOSIS — J301 Allergic rhinitis due to pollen: Secondary | ICD-10-CM | POA: Diagnosis not present

## 2017-07-17 DIAGNOSIS — Z6835 Body mass index (BMI) 35.0-35.9, adult: Secondary | ICD-10-CM | POA: Diagnosis not present

## 2017-07-17 DIAGNOSIS — E6609 Other obesity due to excess calories: Secondary | ICD-10-CM | POA: Diagnosis not present

## 2017-07-17 DIAGNOSIS — Z1389 Encounter for screening for other disorder: Secondary | ICD-10-CM | POA: Diagnosis not present

## 2017-08-18 ENCOUNTER — Other Ambulatory Visit: Payer: Self-pay | Admitting: Orthopaedic Surgery

## 2017-11-11 ENCOUNTER — Ambulatory Visit: Payer: BLUE CROSS/BLUE SHIELD | Admitting: Orthopaedic Surgery

## 2017-11-11 ENCOUNTER — Encounter: Payer: Self-pay | Admitting: Orthopaedic Surgery

## 2017-11-11 ENCOUNTER — Ambulatory Visit (INDEPENDENT_AMBULATORY_CARE_PROVIDER_SITE_OTHER): Payer: BLUE CROSS/BLUE SHIELD

## 2017-11-11 VITALS — BP 140/96 | HR 88 | Ht 74.0 in | Wt 267.0 lb

## 2017-11-11 DIAGNOSIS — M779 Enthesopathy, unspecified: Secondary | ICD-10-CM | POA: Diagnosis not present

## 2017-11-11 DIAGNOSIS — M25572 Pain in left ankle and joints of left foot: Secondary | ICD-10-CM

## 2017-11-11 DIAGNOSIS — M7752 Other enthesopathy of left foot: Secondary | ICD-10-CM

## 2017-11-11 MED ORDER — PREDNISONE 5 MG (21) PO TBPK: ORAL_TABLET | ORAL | 0 refills | Status: DC

## 2017-11-11 NOTE — Progress Notes (Signed)
Patient Luke Chavez, male DOB:Jan 21, 1966, 52 y.o. [id number]  Chief Complaint  Patient presents with  . Ankle Pain    Left ankle pain, no known injury.    HPI  Luke Chavez is a 52 y.o. male who has developed pain of the lateral left ankle over the last six weeks. He denies falls or trauma.  He has no redness or swelling.  The pain is more behind the lateral malleolus area.  He had pain then it got better but over the last week it has gotten worse.  He has used high top shoes, has used [employer] on it with little help. He has no numbness.  He cannot take NSAIDs as they raise his blood pressure.   Body mass index is 34.28 kg/m.  ROS  Review of Systems  Musculoskeletal: Positive for arthralgias, gait problem and joint swelling.  All other systems reviewed and are negative.   All other systems reviewed and are negative.  The following is a summary of the past history medically, past history surgically, known current medicines, social history and family history.  This information is gathered electronically by the computer from prior information and documentation.  I review this each visit and have found including this information at this point in the chart is beneficial and informative.    Past Medical History:  Diagnosis Date  . Arthritis   . Chronic back pain   . Hypertension     Past Surgical History:  Procedure Laterality Date  . COLONOSCOPY N/A 04/17/2015   Procedure: COLONOSCOPY;  Surgeon: Danie Binder, MD;  Location: [facility];  Service: Endoscopy;  Laterality: N/A;  1:00 PM  . KNEE ARTHROSCOPY WITH MEDIAL MENISECTOMY Left 04/24/2017   Procedure: KNEE ARTHROSCOPY WITH MEDIAL MENISECTOMY;  Surgeon: Carole Civil, MD;  Location: [facility];  Service: Orthopedics;  Laterality: Left;    Family History  Problem Relation Age of Onset  . Cancer Father   . Arthritis Father   . Arthritis Maternal Aunt   . Arthritis Maternal Uncle   . Arthritis Paternal  Aunt     Social History Social History   Tobacco Use  . Smoking status: Former Smoker    Types: Cigarettes    Last attempt to quit: 04/22/2009    Years since quitting: 8.5  . Smokeless tobacco: Never Used  Substance Use Topics  . Alcohol use: No  . Drug use: Yes    Types: Cocaine, Marijuana    Comment: former    No Known Allergies  Current Outpatient Medications  Medication Sig Dispense Refill  . diclofenac sodium (VOLTAREN) 1 % GEL Apply 2-4 g topically 4 (four) times daily as needed (for arthritis pain.).    Marland Kitchen HYDROcodone-acetaminophen (NORCO) 7.5-325 MG tablet Take 1 tablet by mouth every 4 (four) hours as needed for moderate pain. 42 tablet 0  . HYDROcodone-acetaminophen (NORCO/VICODIN) 5-325 MG tablet Take 1 tablet by mouth every 6 (six) hours as needed for moderate pain. 28 tablet 0  . ibuprofen (ADVIL,MOTRIN) 200 MG tablet Take 400-800 mg by mouth every 8 (eight) hours as needed (for pain.).    Marland Kitchen naproxen (NAPROSYN) 500 MG tablet TAKE 1 TABLET(500 MG) BY MOUTH TWICE DAILY WITH A MEAL 60 tablet 0  . predniSONE (STERAPRED UNI-PAK 21 TAB) 5 MG (21) TBPK tablet Take 6 pills first day; 5 pills second day; 4 pills third day; 3 pills fourth day; 2 pills next day and 1 pill last day. 21 tablet 0  No current facility-administered medications for this visit.      Physical Exam  Blood pressure (!) 140/96, pulse 88, height 6\' 2"  (1.88 m), weight 267 lb (121.1 kg).  Constitutional: overall normal hygiene, normal nutrition, well developed, normal grooming, normal body habitus. Assistive device:none  Musculoskeletal: gait and station Limp left slightly, muscle tone and strength are normal, no tremors or atrophy is present.  .  Neurological: coordination overall normal.  Deep tendon reflex/nerve stretch intact.  Sensation normal.  Cranial nerves II-XII intact.   Skin:   Normal overall no scars, lesions, ulcers or rashes. No psoriasis.  Psychiatric: Alert and oriented x 3.   Recent memory intact, remote memory unclear.  Normal mood and affect. Well groomed.  Good eye contact.  Cardiovascular: overall no swelling, no varicosities, no edema bilaterally, normal temperatures of the legs and arms, no clubbing, cyanosis and good capillary refill.  Lymphatic: palpation is normal.  He has tenderness of the peroneal tendons behind the lateral malleolus.  He has full ROM of the left ankle but it is tender when he does it.  He has no redness, no numbness.  NV intact. He has a very slight limp left.  All other systems reviewed and are negative   The patient has been educated about the nature of the problem(s) and counseled on treatment options.  The patient appeared to understand what I have discussed and is in agreement with it.  X-rays were done of the left ankle, reported separately.  Encounter Diagnoses  Name Primary?  . Acute left ankle pain Yes  . Tendinitis of left ankle     PLAN Call if any problems.  Precautions discussed.  Continue current medications. Begin Prednisone dose pack.  Return to clinic 2 weeks   Electronically Signed Sanjuana Kava, MD 9/10/20199:00 AM

## 2017-11-25 ENCOUNTER — Ambulatory Visit: Payer: BLUE CROSS/BLUE SHIELD | Admitting: Orthopaedic Surgery

## 2017-11-25 ENCOUNTER — Encounter: Payer: Self-pay | Admitting: Orthopaedic Surgery

## 2017-11-25 VITALS — BP 134/94 | HR 81 | Ht 74.0 in | Wt 267.0 lb

## 2017-11-25 DIAGNOSIS — M7752 Other enthesopathy of left foot: Secondary | ICD-10-CM

## 2017-11-25 DIAGNOSIS — M779 Enthesopathy, unspecified: Secondary | ICD-10-CM

## 2017-11-25 NOTE — Progress Notes (Signed)
Patient Luke Chavez, male DOB:17-Dec-1965, 52 y.o. [id number]  Chief Complaint  Patient presents with  . Ankle Pain    left    HPI  Luke Chavez is a 52 y.o. male who has had pain of the left ankle.  He is better.  He has less pain.   He has been taking his medicine.  He still has a little swelling laterally and some mild pain but is improved.  He has no new trauma.   Body mass index is 34.28 kg/m.  ROS  Review of Systems  Musculoskeletal: Positive for arthralgias, gait problem and joint swelling.  All other systems reviewed and are negative.   All other systems reviewed and are negative.  The following is a summary of the past history medically, past history surgically, known current medicines, social history and family history.  This information is gathered electronically by the computer from prior information and documentation.  I review this each visit and have found including this information at this point in the chart is beneficial and informative.    Past Medical History:  Diagnosis Date  . Arthritis   . Chronic back pain   . Hypertension     Past Surgical History:  Procedure Laterality Date  . COLONOSCOPY N/A 04/17/2015   Procedure: COLONOSCOPY;  Surgeon: Danie Binder, MD;  Location: [facility];  Service: Endoscopy;  Laterality: N/A;  1:00 PM  . KNEE ARTHROSCOPY WITH MEDIAL MENISECTOMY Left 04/24/2017   Procedure: KNEE ARTHROSCOPY WITH MEDIAL MENISECTOMY;  Surgeon: Carole Civil, MD;  Location: [facility];  Service: Orthopedics;  Laterality: Left;    Family History  Problem Relation Age of Onset  . Cancer Father   . Arthritis Father   . Arthritis Maternal Aunt   . Arthritis Maternal Uncle   . Arthritis Paternal Aunt     Social History Social History   Tobacco Use  . Smoking status: Former Smoker    Types: Cigarettes    Last attempt to quit: 04/22/2009    Years since quitting: 8.6  . Smokeless tobacco: Never Used  Substance Use  Topics  . Alcohol use: No  . Drug use: Yes    Types: Cocaine, Marijuana    Comment: former    No Known Allergies  Current Outpatient Medications  Medication Sig Dispense Refill  . diclofenac sodium (VOLTAREN) 1 % GEL Apply 2-4 g topically 4 (four) times daily as needed (for arthritis pain.).    Marland Kitchen ibuprofen (ADVIL,MOTRIN) 200 MG tablet Take 400-800 mg by mouth every 8 (eight) hours as needed (for pain.).     No current facility-administered medications for this visit.      Physical Exam  Blood pressure (!) 134/94, pulse 81, height 6\' 2"  (1.88 m), weight 267 lb (121.1 kg).  Constitutional: overall normal hygiene, normal nutrition, well developed, normal grooming, normal body habitus. Assistive device:none  Musculoskeletal: gait and station Limp none, muscle tone and strength are normal, no tremors or atrophy is present.  .  Neurological: coordination overall normal.  Deep tendon reflex/nerve stretch intact.  Sensation normal.  Cranial nerves II-XII intact.   Skin:   Normal overall no scars, lesions, ulcers or rashes. No psoriasis.  Psychiatric: Alert and oriented x 3.  Recent memory intact, remote memory unclear.  Normal mood and affect. Well groomed.  Good eye contact.  Cardiovascular: overall no swelling, no varicosities, no edema bilaterally, normal temperatures of the legs and arms, no clubbing, cyanosis and good capillary refill.  Lymphatic:  palpation is normal.  There is minimal swelling laterally of the left ankle with slight tenderness of the anterior talofibular ligament. ROM is full and gait is normal.  NV is intact.  All other systems reviewed and are negative   The patient has been educated about the nature of the problem(s) and counseled on treatment options.  The patient appeared to understand what I have discussed and is in agreement with it.  Encounter Diagnosis  Name Primary?  . Tendinitis of left ankle Yes    PLAN Call if any problems.  Precautions  discussed.  Continue current medications.   Return to clinic prn   Electronically [place], MD 9/24/20198:17 AM

## 2018-04-16 DIAGNOSIS — E6609 Other obesity due to excess calories: Secondary | ICD-10-CM | POA: Diagnosis not present

## 2018-04-16 DIAGNOSIS — Z6835 Body mass index (BMI) 35.0-35.9, adult: Secondary | ICD-10-CM | POA: Diagnosis not present

## 2018-04-16 DIAGNOSIS — Z1389 Encounter for screening for other disorder: Secondary | ICD-10-CM | POA: Diagnosis not present

## 2018-04-16 DIAGNOSIS — M1712 Unilateral primary osteoarthritis, left knee: Secondary | ICD-10-CM | POA: Diagnosis not present

## 2018-04-16 DIAGNOSIS — J019 Acute sinusitis, unspecified: Secondary | ICD-10-CM | POA: Diagnosis not present

## 2018-08-12 DIAGNOSIS — M7542 Impingement syndrome of left shoulder: Secondary | ICD-10-CM | POA: Diagnosis not present

## 2018-08-12 DIAGNOSIS — M7582 Other shoulder lesions, left shoulder: Secondary | ICD-10-CM | POA: Diagnosis not present

## 2018-08-12 DIAGNOSIS — Z6833 Body mass index (BMI) 33.0-33.9, adult: Secondary | ICD-10-CM | POA: Diagnosis not present

## 2019-01-06 ENCOUNTER — Other Ambulatory Visit: Payer: Self-pay | Admitting: *Deleted

## 2019-01-06 DIAGNOSIS — Z20822 Contact with and (suspected) exposure to covid-19: Secondary | ICD-10-CM

## 2019-01-07 LAB — NOVEL CORONAVIRUS, NAA: SARS-CoV-2, NAA: NOT DETECTED

## 2019-05-05 DIAGNOSIS — Z6833 Body mass index (BMI) 33.0-33.9, adult: Secondary | ICD-10-CM | POA: Diagnosis not present

## 2019-05-05 DIAGNOSIS — M25512 Pain in left shoulder: Secondary | ICD-10-CM | POA: Diagnosis not present

## 2019-05-05 DIAGNOSIS — E6609 Other obesity due to excess calories: Secondary | ICD-10-CM | POA: Diagnosis not present

## 2019-05-05 DIAGNOSIS — Z1389 Encounter for screening for other disorder: Secondary | ICD-10-CM | POA: Diagnosis not present

## 2019-07-05 DIAGNOSIS — M898X1 Other specified disorders of bone, shoulder: Secondary | ICD-10-CM | POA: Diagnosis not present

## 2019-07-05 DIAGNOSIS — S46012A Strain of muscle(s) and tendon(s) of the rotator cuff of left shoulder, initial encounter: Secondary | ICD-10-CM | POA: Diagnosis not present

## 2019-07-05 DIAGNOSIS — M19012 Primary osteoarthritis, left shoulder: Secondary | ICD-10-CM | POA: Diagnosis not present

## 2019-07-05 DIAGNOSIS — S43432A Superior glenoid labrum lesion of left shoulder, initial encounter: Secondary | ICD-10-CM | POA: Diagnosis not present

## 2019-07-15 ENCOUNTER — Encounter: Payer: Self-pay | Admitting: Orthopedic Surgery

## 2019-07-20 ENCOUNTER — Ambulatory Visit: Payer: BLUE CROSS/BLUE SHIELD

## 2019-07-20 ENCOUNTER — Encounter: Payer: Self-pay | Admitting: Orthopedic Surgery

## 2019-07-20 ENCOUNTER — Other Ambulatory Visit: Payer: Self-pay

## 2019-07-20 ENCOUNTER — Ambulatory Visit: Payer: BLUE CROSS/BLUE SHIELD | Admitting: Orthopedic Surgery

## 2019-07-20 VITALS — BP 164/105 | HR 72 | Ht 74.0 in | Wt 265.0 lb

## 2019-07-20 DIAGNOSIS — M25512 Pain in left shoulder: Secondary | ICD-10-CM

## 2019-07-20 DIAGNOSIS — G8929 Other chronic pain: Secondary | ICD-10-CM

## 2019-07-20 DIAGNOSIS — M75112 Incomplete rotator cuff tear or rupture of left shoulder, not specified as traumatic: Secondary | ICD-10-CM | POA: Diagnosis not present

## 2019-07-20 NOTE — Patient Instructions (Signed)
You have received an injection of steroids into the joint. 15% of patients will have increased pain within the 24 hours postinjection.   This is transient and will go away.   We recommend that you use ice packs on the injection site for 20 minutes every 2 hours and extra strength Tylenol 2 tablets every 8 as needed until the pain resolves.  If you continue to have pain after taking the Tylenol and using the ice please call the office for further instructions.  Start therapy   Take ibuprofen as needed for pain   Fu in 8 weeks

## 2019-07-20 NOTE — Progress Notes (Signed)
NEW PROBLEM//OFFICE VISIT  Chief Complaint  Patient presents with  . 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  He currently has pain in the upper shoulder at night and when he is climbing ladders at work.  He did get some prednisone from his primary care doctor which did not help his pain.  He reports no range of motion loss no weakness     Review of Systems  Neurological: Negative for tingling.  All other systems reviewed and are negative.    Past Medical History:  Diagnosis Date  . Arthritis   . Chronic back pain   . Hypertension     Past Surgical History:  Procedure Laterality Date  . COLONOSCOPY N/A 04/17/2015   Procedure: COLONOSCOPY;  Surgeon: Danie Binder, MD;  [facility];  Service: Endoscopy;  Laterality: N/A;  1:00 PM  . KNEE ARTHROSCOPY WITH MEDIAL MENISECTOMY Left 04/24/2017   Procedure: KNEE ARTHROSCOPY WITH MEDIAL MENISECTOMY;  Surgeon: Carole Civil, MD;  Location: [facility];  Service: Orthopedics;  Laterality: Left;    Family History  Problem Relation Age of Onset  . Cancer Father   . Arthritis Father   . Arthritis Maternal Aunt   . Arthritis Maternal Uncle   . Arthritis Paternal Aunt    Social History   Tobacco Use  . Smoking status: Former Smoker    Types: Cigarettes    Quit date: 04/22/2009    Years since quitting: 10.2  . Smokeless tobacco: Never Used  Substance Use Topics  . Alcohol use: No  . Drug use: Yes    Types: Cocaine, Marijuana    Comment: former    No Known Allergies  Current Meds  Medication Sig  . meloxicam (MOBIC) 15 MG tablet Take 15 mg by mouth daily.    BP (!) 164/105   Pulse 72   Ht 6\' 2"  (1.88 m)   Wt 265 lb (120.2 kg)   BMI 34.02 kg/m   Physical Exam Constitutional:      General: He is not in acute distress.    Appearance: He is  well-developed.  Cardiovascular:     Comments: No peripheral edema Skin:    General: Skin is warm and dry.  Neurological:     Mental Status: He is alert and oriented to person, place, and time.     Sensory: No sensory deficit.     Coordination: Coordination normal.     Gait: Gait normal.     Deep Tendon Reflexes: Reflexes are normal and symmetric.     Right Shoulder Exam  Right shoulder exam is normal.   Left Shoulder Exam   Tenderness  Left shoulder tenderness location: Mild tenderness anterior joint line and rotator interval.  Range of Motion  The patient has normal left shoulder ROM.  Muscle Strength  The patient has normal left shoulder strength.  Tests  Apprehension: negative Hawkins test: positive Cross arm: negative Impingement: positive Drop arm: negative Sulcus: absent  Other  Erythema: present Scars: absent Sensation: none Pulse: absent   Comments:  Only places that reproduction of pain included external rotation against resistance but there was no weakness and mild pain in the empty can position.  Of course his Hawkins and Neer sign were positive for pain        MEDICAL DECISION MAKING  A.  Encounter Diagnoses  Name Primary?  . Chronic left shoulder pain   . Nontraumatic incomplete tear of left rotator cuff Yes    B. DATA ANALYSED:    IMAGING: Independent interpretation of images: MRI report and films show bursal surface partial-thickness rotator cuff tear labral cysts and glenoid labral tears  In office x-ray was normal with type II acromion although MRI was read as type III  Orders: PT  Outside records reviewed:  #1 [facility] primary care notes indicate no major medical problems or medicines taken by the patient  2.  Outside images rotator cuff tear seen on MRI is partial is on the bursal surface there are some para labral cysts and global degenerative labral tears  C. MANAGEMENT  #1 subacromial injection #2  physical therapy #3 over-the-counter NSAID #4 follow-up in 8 weeks  Recommend physical therapy first.  He has good strength minimal symptoms doubt a rotator cuff repair would help him at this point without a full-thickness lesion    Procedure note the subacromial injection shoulder left   Verbal consent was obtained to inject the  Left   Shoulder  Timeout was completed to confirm the injection site is a subacromial space of the  left  shoulder  Medication used Depo-Medrol 40 mg and lidocaine 1% 3 cc  Anesthesia was provided by ethyl chloride  The injection was performed in the left  posterior subacromial space. After pinning the skin with alcohol and anesthetized the skin with ethyl chloride the subacromial space was injected using a 20-gauge needle. There were no complications  Sterile dressing was applied.          No orders of the defined types were placed in this encounter.     Arther Abbott, MD  07/20/2019 2:16 PM

## 2019-07-30 ENCOUNTER — Ambulatory Visit (HOSPITAL_COMMUNITY): Payer: BLUE CROSS/BLUE SHIELD | Attending: Orthopedic Surgery | Admitting: Occupational Therapy

## 2019-07-30 ENCOUNTER — Encounter (HOSPITAL_COMMUNITY): Payer: Self-pay | Admitting: Occupational Therapy

## 2019-07-30 ENCOUNTER — Other Ambulatory Visit: Payer: Self-pay

## 2019-07-30 DIAGNOSIS — R29898 Other symptoms and signs involving the musculoskeletal system: Secondary | ICD-10-CM | POA: Diagnosis not present

## 2019-07-30 DIAGNOSIS — G8929 Other chronic pain: Secondary | ICD-10-CM | POA: Insufficient documentation

## 2019-07-30 DIAGNOSIS — M25512 Pain in left shoulder: Secondary | ICD-10-CM | POA: Insufficient documentation

## 2019-07-30 NOTE — Patient Instructions (Signed)
Repeat all exercises 10-15 times, 1-2 times per day.  1) Shoulder Protraction    Begin with elbows by your side, slowly "punch" straight out in front of you.      2) Shoulder Flexion  Standing:         Begin with arms at your side with thumbs pointed up, slowly raise both arms up and forward towards overhead.               3) Horizontal abduction/adduction  Standing:           Begin with arms straight out in front of you, bring out to the side in at "T" shape. Keep arms straight entire time.                 4) Internal & External Rotation  Standing:    Stand with elbows at the side and elbows bent 90 degrees. Move your forearms away from your body, then bring back inward toward the body.     5) Shoulder Abduction  Standing:       Lying on your back begin with your arms flat on the table next to your side. Slowly move your arms out to the side so that they go overhead, in a jumping jack or snow angel movement.      Theraband strengthening: Complete 10-15X, 1-2X/day  6) Shoulder External Rotation  While holding an elastic band at your side with your elbow bent, start with your hand near your stomach and then pull the band away. Keep your elbow at your side the entire time.     7) (Home) Extension: Isometric / Bilateral Arm Retraction - Sitting   Facing anchor, hold hands and elbow at shoulder height, with elbow bent.  Pull arms back to squeeze shoulder blades together. Repeat 10-15 times. 1-3 times/day.   8) (Clinic) Extension / Flexion (Assist)   Face anchor, pull arms back, keeping elbow straight, and squeze shoulder blades together. Repeat 10-15 times. 1-3 times/day.   Copyright  VHI. All rights reserved.   9) (Home) Retraction: Row - Bilateral (Anchor)   Facing anchor, arms reaching forward, pull hands toward stomach, keeping elbows bent and at your sides and pinching shoulder blades together. Repeat 10-15 times. 1-3  times/day.   Copyright  VHI. All rights reserved.

## 2019-07-30 NOTE — Therapy (Signed)
[place], [place], [address] Phone: [phone]   Fax:  [phone]  Occupational Therapy Evaluation  Patient Details  Name: Luke Chavez MRN: [id number] Date of Birth: [birth year] Referring Provider (OT): Dr. Arther Abbott   Encounter Date: 07/30/2019  OT End of Session - 07/30/19 1416    Visit Number  1    Number of Visits  1    Date for OT Re-Evaluation  08/03/19    Authorization Type  [employer]    Authorization Time Period  30 visit limit OT/PT combined    Authorization - Visit Number  1    Authorization - Number of Visits  30    OT Start Time  [id number]    OT Stop Time  1414    OT Time Calculation (min)  26 min    Activity Tolerance  Patient tolerated treatment well    Behavior During Therapy  [facility] for tasks assessed/performed       Past Medical History:  Diagnosis Date  . Arthritis   . Chronic back pain   . Hypertension     Past Surgical History:  Procedure Laterality Date  . COLONOSCOPY N/A 04/17/2015   Procedure: COLONOSCOPY;  Surgeon: Danie Binder, MD;  [facility];  Service: Endoscopy;  Laterality: N/A;  1:00 PM  . KNEE ARTHROSCOPY WITH MEDIAL MENISECTOMY Left 04/24/2017   Procedure: KNEE ARTHROSCOPY WITH MEDIAL MENISECTOMY;  Surgeon: Carole Civil, MD;  Location: [facility];  Service: Orthopedics;  Laterality: Left;    There were no vitals filed for this visit.  Subjective Assessment - 07/30/19 1354    Subjective   S: It mostly hurts at night.    Pertinent History  Pt is a 54 y/o male presenting with chronic shoulder pain present for approxiamately 4 years. Pt had a cortisone injection on 07/20/19 and was referred to occupational therapy for evaluation and treatment by Dr. Arther Abbott.    Patient Stated Goals  To have less pain    Currently in Pain?  No/denies        [facility] OT Assessment - 07/30/19 1350      Assessment   Medical Diagnosis  left RC tear    Referring  Provider (OT)  Dr. Arther Abbott    Onset Date/Surgical Date  --   4 years   Hand Dominance  Right    Prior Therapy  None for this issue      Precautions   Precautions  None      Restrictions   Weight Bearing Restrictions  No      Balance Screen   Has the patient fallen in the past 6 months  No    Has the patient had a decrease in activity level because of a fear of falling?   No    Is the patient reluctant to leave their home because of a fear of falling?   No      Prior Function   Level of Independence  Independent    Vocation  Full time employment    Vocation Requirements  [employer]    Leisure  none      ADL   ADL comments  Pt reports difficulty with sleeping, climbing a ladder and pulling with his arms. Pt is having difficulty with lifting weighted items at times.       Written Expression   Dominant Hand  Right      ROM / Strength  AROM / PROM / Strength  AROM;Strength      AROM   Overall AROM Comments  Assessed seated, er/IR adducted    AROM Assessment Site  Shoulder    Right/Left Shoulder  Left    Left Shoulder Flexion  180 Degrees    Left Shoulder ABduction  180 Degrees    Left Shoulder Internal Rotation  90 Degrees    Left Shoulder External Rotation  32 Degrees   equivalent with RUE     Strength   Overall Strength Comments  Assessed seated, er/IR adducted    Strength Assessment Site  Shoulder    Right/Left Shoulder  Left    Left Shoulder Flexion  5/5    Left Shoulder ABduction  5/5    Left Shoulder Internal Rotation  5/5    Left Shoulder External Rotation  4+/5                      OT Education - 07/30/19 1405    Education Details  A/ROM, er with red theraband, scapular theraband    Person(s) Educated  Patient    Methods  Explanation;Demonstration;Handout    Comprehension  Verbalized understanding;Returned demonstration       OT Short Term Goals - 07/30/19 1419      OT SHORT TERM GOAL #1   Title  Pt  will be provided with and educated on HEP to improve LUE functioning and mobility required for ADL completion.    Time  1    Period  Days    Status  Achieved    Target Date  07/30/19               Plan - 07/30/19 1417    Clinical Impression Statement  A: Pt is a 54 y/o male presenting with chronic left shoulder pain present for approximately 4 years. Pt reports MRI shows incomplete tear, mostly has pain at night. Pt with ROM and strength WNL, discussed changing sleeping position to lessen pain and improve ability to sleep. Provided HEP, pt completing with good form.    OT Occupational Profile and History  Problem Focused Assessment - Including review of records relating to presenting problem    Occupational performance deficits (Please refer to evaluation for details):  ADL's;Rest and Sleep;IADL's;Work    Body Structure / Function / Physical Skills  Pain    Clinical Decision Making  Limited treatment options, no task modification necessary    Comorbidities Affecting Occupational Performance:  None    Modification or Assistance to Complete Evaluation   No modification of tasks or assist necessary to complete eval    OT Frequency  One time visit    OT Treatment/Interventions  Patient/family education    Plan  P: Pt educated on HEP and is completing with good form. No further OT services required at this time.    OT Home Exercise Plan  5/28: A/ROM, scapular theraband, er strengthening with red theraband    Consulted and Agree with Plan of Care  Patient       Patient will benefit from skilled therapeutic intervention in order to improve the following deficits and impairments:   Body Structure / Function / Physical Skills: Pain       Visit Diagnosis: Chronic left shoulder pain  Other symptoms and signs involving the musculoskeletal system    Problem List Patient Active Problem List   Diagnosis Date Noted  . Medial meniscus, posterior horn derangement 05/13/2017  . S/P left  knee  arthroscopy 04/24/17   . Primary osteoarthritis of left knee    Guadelupe Sabin, OTR/L  [phone] 07/30/2019, 2:20 PM  [facility] [address] [place], [place], [address] Phone: [phone]   Fax:  [phone]  Name: PAXON CAVITT MRN: [id number] Date of Birth: Mar 24, 1965

## 2019-09-12 IMAGING — MR MR KNEE*L* W/O CM
4 of 5 series · 13 of 40 positions shown · non-contrast
Comparison: None.

CLINICAL DATA: Left knee pain for 3 months

EXAM:
MRI OF THE LEFT KNEE WITHOUT CONTRAST
TECHNIQUE: Multiplanar, multisequence MR imaging of the knee was performed. No
intravenous contrast was administered.

[Series 3: pdfs axial · axial · 3.0mm · 0.21mm/px · z∈[-57,+22]mm · 3 of 30 slices shown]
[im 4/30]
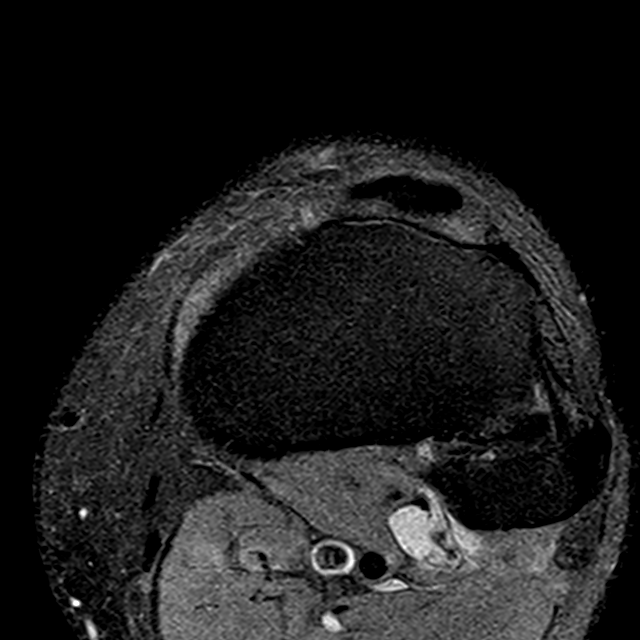
[im 15/30]
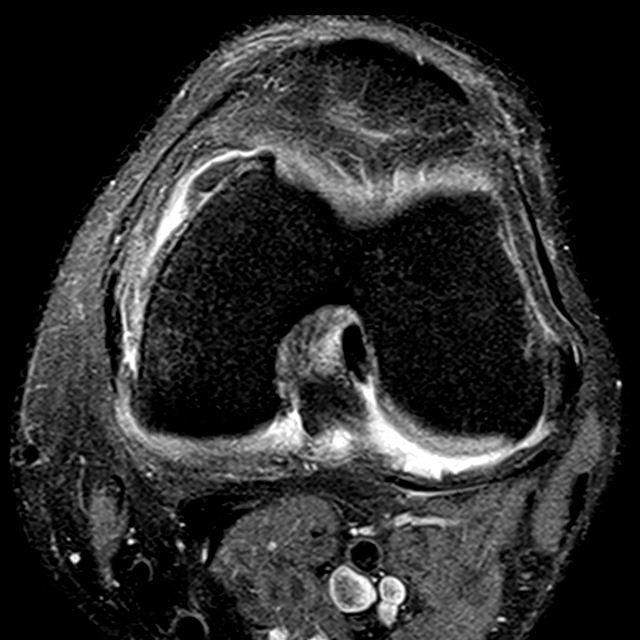
[im 26/30]
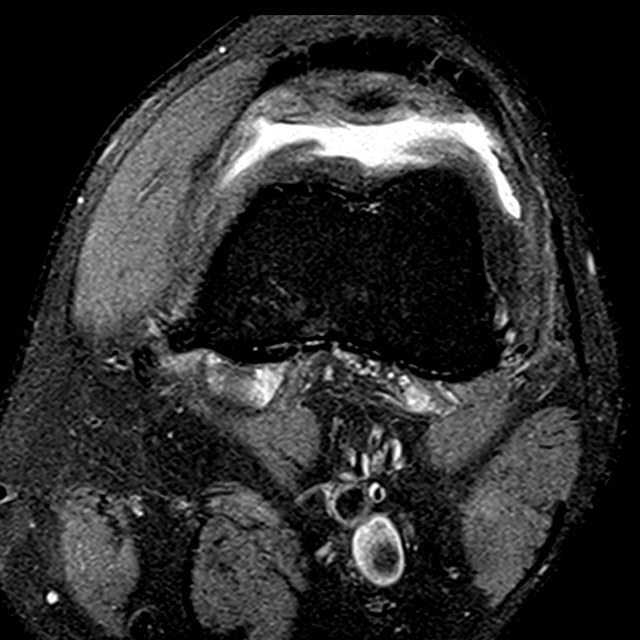

[Series 4: T1 · coronal · 3.0mm · 0.21mm/px · 4 of 26 slices shown]
[im 1/26]
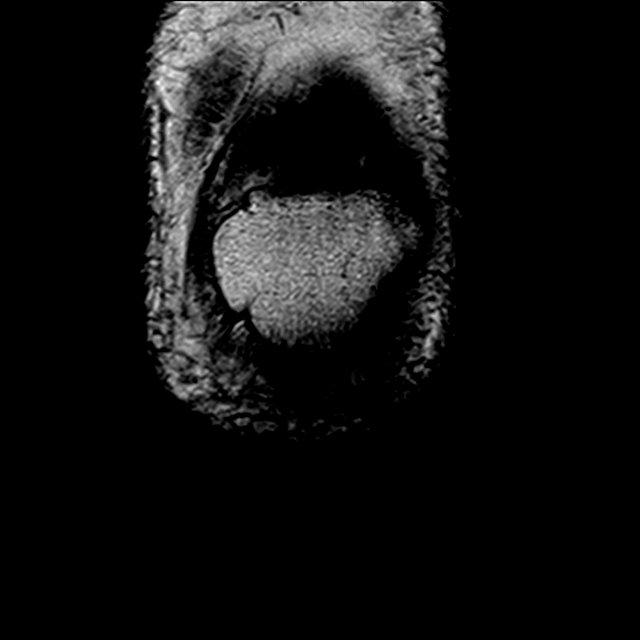
[im 4/26]
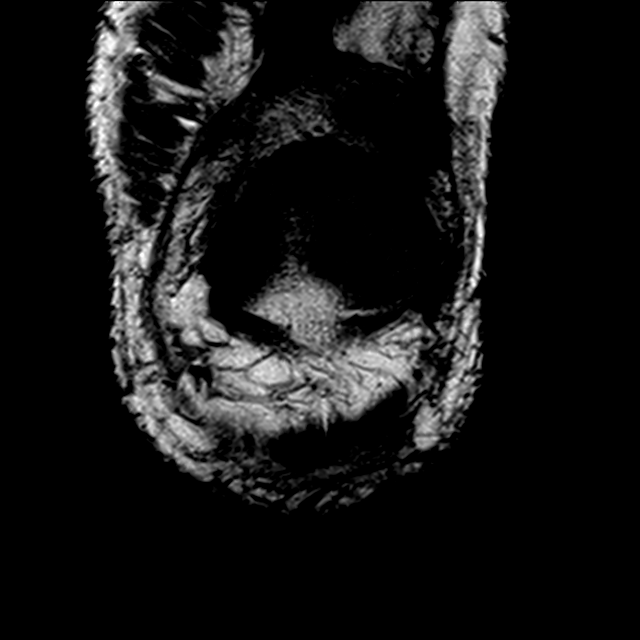
[im 15/26]
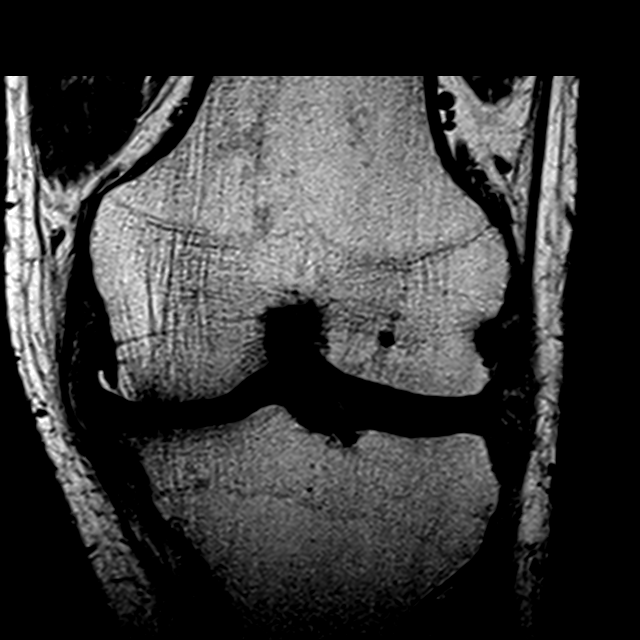
[im 22/26]
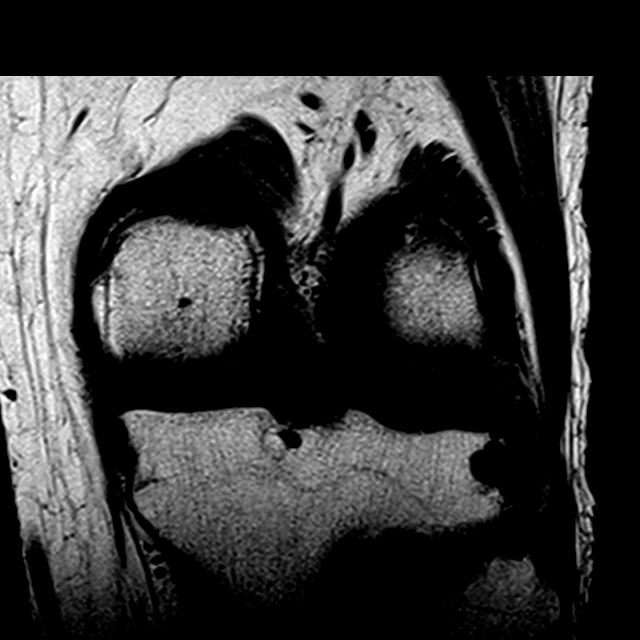

[Series 5: pdfs sag · sagittal · 3.0mm · 0.23mm/px · 3 of 29 slices shown]
[im 4/29]
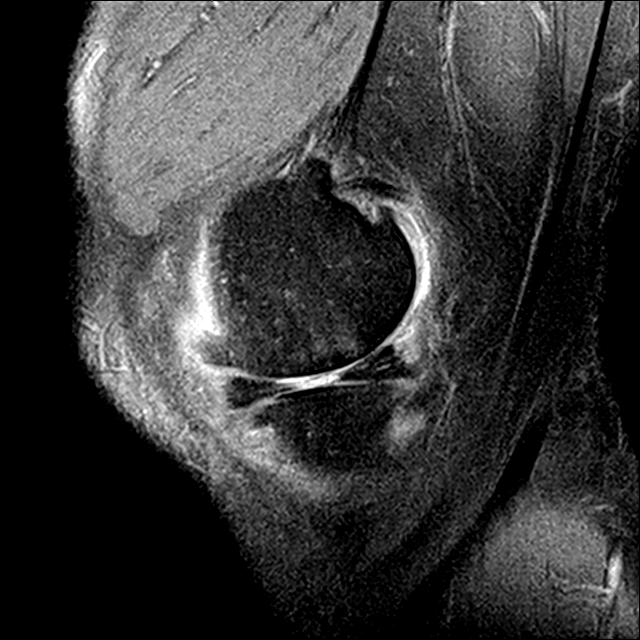
[im 15/29]
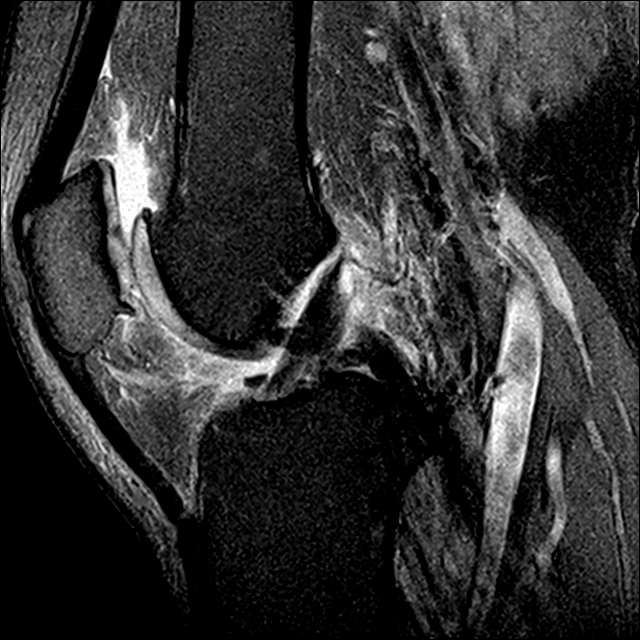
[im 25/29]
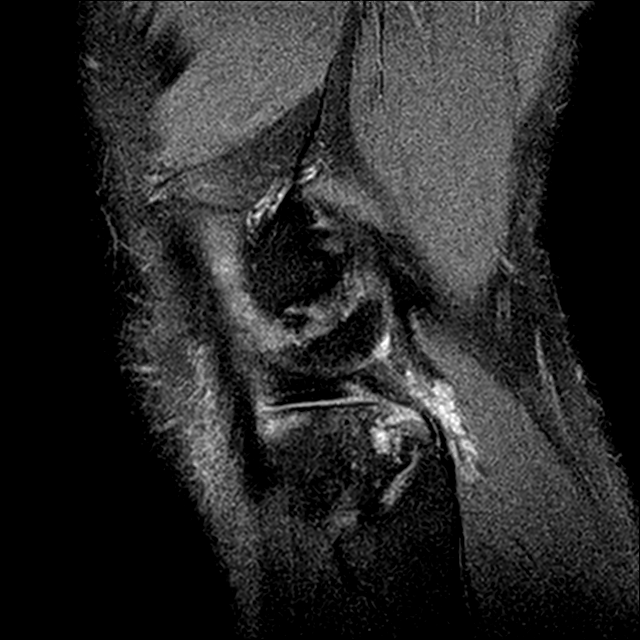

[Series 6: t2fs cor · coronal · 3.0mm · 0.23mm/px · 3 of 24 slices shown]
[im 4/24]
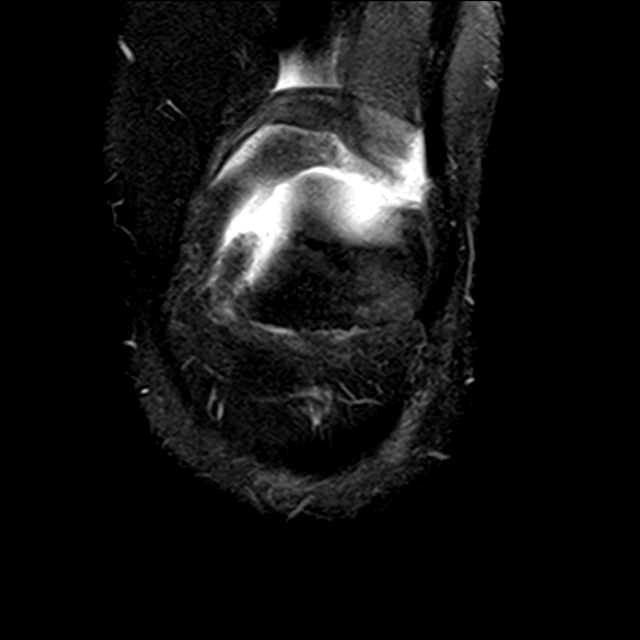
[im 12/24]
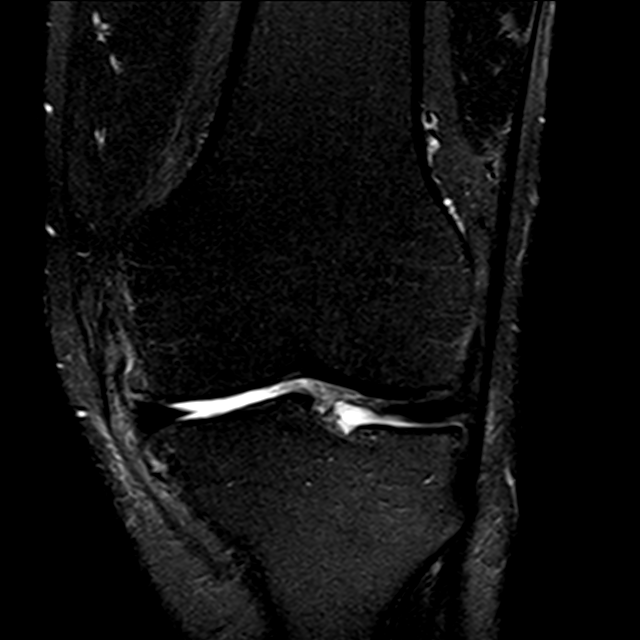
[im 20/24]
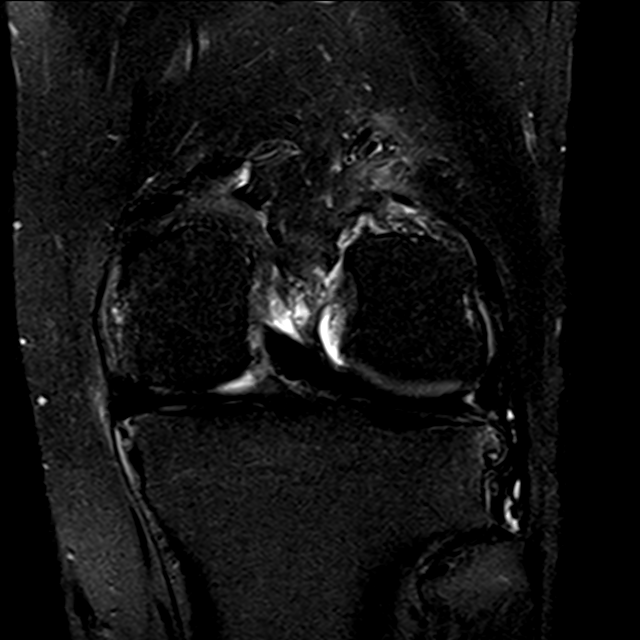

[13 of 40 positions shown; findings below may reference images not displayed]

FINDINGS: MENISCI

Medial meniscus: Degeneration of the posterior horn and body of the
medial meniscus with a radial tear of free edge of the body of the
medial meniscus. Oblique tear of the posterior horn of the medial
meniscus extending to the superior articular surface.

Lateral meniscus:  Intact.

LIGAMENTS

Cruciates:  Intact ACL and PCL.

Collaterals: Medial collateral ligament is intact. Lateral
collateral ligament complex is intact.

CARTILAGE

Patellofemoral: Mild cartilage irregularity of medial patellar
facet.

Medial: Extensive full-thickness cartilage loss of the medial
femorotibial compartment with small marginal osteophytes.

Lateral:  No chondral defect.

Joint: Small joint effusion. Mild edema in Hoffa's fat. No plical
thickening.

Popliteal Fossa: No Baker cyst. Small amount of fluid extending into
the popliteus tendon sheath. Intact popliteus tendon.

Extensor Mechanism: Intact quadriceps tendon. Intact patellar
tendon. Intact medial patellar retinaculum. Intact lateral patellar
retinaculum. Intact MPFL.

Bones:  No acute osseous abnormality.  No aggressive osseous lesion.

Other: No fluid collection or hematoma.
IMPRESSION: 1. Severe advanced osteoarthritis of the medial femorotibial
compartment.
2. Degeneration of the posterior horn and body of the medial
meniscus with a radial tear of free edge of the body of the medial
meniscus.

## 2019-09-13 ENCOUNTER — Ambulatory Visit: Payer: BLUE CROSS/BLUE SHIELD | Admitting: Orthopedic Surgery

## 2019-09-13 ENCOUNTER — Other Ambulatory Visit: Payer: Self-pay

## 2019-09-13 ENCOUNTER — Encounter: Payer: Self-pay | Admitting: Orthopedic Surgery

## 2019-09-13 VITALS — BP 145/92 | HR 76 | Ht 74.0 in | Wt 271.0 lb

## 2019-09-13 DIAGNOSIS — M75112 Incomplete rotator cuff tear or rupture of left shoulder, not specified as traumatic: Secondary | ICD-10-CM

## 2019-09-13 DIAGNOSIS — G8929 Other chronic pain: Secondary | ICD-10-CM | POA: Diagnosis not present

## 2019-09-13 DIAGNOSIS — M25512 Pain in left shoulder: Secondary | ICD-10-CM | POA: Diagnosis not present

## 2019-09-13 NOTE — Progress Notes (Signed)
Chief Complaint  Patient presents with  . Shoulder Pain    Lt shoulder f/u PT    Encounter Diagnoses  Name Primary?  . Chronic left shoulder pain Yes  . Nontraumatic incomplete tear of left rotator cuff     Luke Chavez is 54 years old he had an atraumatic tear of his left rotator cuff although he had minimal strength deficits he did have pain.  He was treated with injection anti-inflammatory medication physical therapy and he recovered nicely with only mild discomfort.  Examination reveals full range of motion of the shoulder and normal strength  Recommend as needed follow-up.  Okay to repeat injections up to 4 times a year  Discussed side effects of injection including tendon rupture and degeneration

## 2020-04-03 ENCOUNTER — Encounter: Payer: Self-pay | Admitting: Internal Medicine

## 2020-04-24 ENCOUNTER — Ambulatory Visit (HOSPITAL_COMMUNITY)
Admission: RE | Admit: 2020-04-24 | Discharge: 2020-04-24 | Disposition: A | Discharge status: Home / Self Care | Payer: BLUE CROSS/BLUE SHIELD | Source: Ambulatory Visit | Attending: Physician Assistant | Admitting: Physician Assistant

## 2020-04-24 ENCOUNTER — Other Ambulatory Visit: Payer: Self-pay

## 2020-04-24 ENCOUNTER — Other Ambulatory Visit (HOSPITAL_COMMUNITY): Payer: Self-pay | Admitting: Physician Assistant

## 2020-04-24 DIAGNOSIS — M25562 Pain in left knee: Secondary | ICD-10-CM

## 2020-04-24 DIAGNOSIS — Z1331 Encounter for screening for depression: Secondary | ICD-10-CM | POA: Diagnosis not present

## 2020-04-24 DIAGNOSIS — Z6835 Body mass index (BMI) 35.0-35.9, adult: Secondary | ICD-10-CM | POA: Diagnosis not present

## 2020-04-24 DIAGNOSIS — M25561 Pain in right knee: Secondary | ICD-10-CM

## 2020-04-24 DIAGNOSIS — M1712 Unilateral primary osteoarthritis, left knee: Secondary | ICD-10-CM | POA: Diagnosis not present

## 2020-04-24 DIAGNOSIS — E6609 Other obesity due to excess calories: Secondary | ICD-10-CM | POA: Diagnosis not present

## 2020-04-24 DIAGNOSIS — M1711 Unilateral primary osteoarthritis, right knee: Secondary | ICD-10-CM | POA: Diagnosis not present

## 2020-05-22 ENCOUNTER — Ambulatory Visit: Payer: BLUE CROSS/BLUE SHIELD | Admitting: Orthopedic Surgery

## 2020-05-22 ENCOUNTER — Other Ambulatory Visit: Payer: Self-pay

## 2020-05-22 ENCOUNTER — Encounter: Payer: Self-pay | Admitting: Orthopedic Surgery

## 2020-05-22 VITALS — BP 177/111 | HR 107 | Ht 74.0 in | Wt 275.2 lb

## 2020-05-22 DIAGNOSIS — G8929 Other chronic pain: Secondary | ICD-10-CM

## 2020-05-22 DIAGNOSIS — M1712 Unilateral primary osteoarthritis, left knee: Secondary | ICD-10-CM | POA: Diagnosis not present

## 2020-05-22 DIAGNOSIS — M1711 Unilateral primary osteoarthritis, right knee: Secondary | ICD-10-CM

## 2020-05-22 DIAGNOSIS — M1710 Unilateral primary osteoarthritis, unspecified knee: Secondary | ICD-10-CM

## 2020-05-22 NOTE — Progress Notes (Signed)
Chief Complaint  Patient presents with  . Knee Pain    Bilateral/ Left is the worst.   Luke Chavez had a knee arthroscopy back in 2019 he had some pretty severe arthritis in his medial compartment along with a meniscal tear he comes in today with bilateral knee pain left worse than right he is already had x-rays which show fairly severe arthritis and varus alignment to the left and right knee with the left knee currently causing more pain  He is currently on meloxicam once a day  He has not lost any motion or function per se just medial pain with some occasional lateral pain in the left knee and then diffuse pain in the right knee  Review of systems is negative for any serious medical symptoms such as chest pain shortness of breath numbness tingling or back pain  Past Medical History:  Diagnosis Date  . Arthritis   . Chronic back pain   . Hypertension    Past Surgical History:  Procedure Laterality Date  . COLONOSCOPY N/A 04/17/2015   Procedure: COLONOSCOPY;  Surgeon: Danie Binder, MD;  [facility];  Service: Endoscopy;  Laterality: N/A;  1:00 PM  . KNEE ARTHROSCOPY WITH MEDIAL MENISECTOMY Left 04/24/2017   Procedure: KNEE ARTHROSCOPY WITH MEDIAL MENISECTOMY;  Surgeon: Carole Civil, MD;  Location: [facility];  Service: Orthopedics;  Laterality: Left;    BP (!) 177/111   Pulse (!) 107   Ht 6\' 2"  (1.88 m)   Wt 275 lb 4 oz (124.9 kg)   BMI 35.34 kg/m   He is awake alert and oriented x3  Mood is pleasant affect is normal  Appearance well-groomed well-developed well-nourished  Cardiovascular no abnormalities no edema  Gait is normal  Left and right knee exhibit mild varus alignment He is tender on the medial side both knees but minimally so he has a little lateral tenderness in the left knee He has small effusions in each knee but both knees are stable  Encounter Diagnoses  Name Primary?  . Bilateral chronic knee pain Yes  . Primary localized osteoarthritis of  knee right knee and left knee     Outside films are reviewed [facility] dated April 24, 2020 4 views each knee.  He has secondary bone changes and primarily narrowing of the medial compartment bilaterally with left worse than right there is slight varus alignment to the knee with small joint effusions are noted.  He is 55 years old healthy he still has his range of motion intact and fairly good function  Recommend hyaluronic acid injection.  Differential treatments could include total knee unicompartment knee for further medical management  In the meantime take 7-1/2 mg of meloxicam twice a day

## 2020-05-23 ENCOUNTER — Encounter: Payer: Self-pay | Admitting: Radiology

## 2020-05-23 NOTE — Progress Notes (Signed)
Prior auth needed, faxing in forms to [facility] for auth Durolane bilateral knees.   Admin and Durolane covered at 100%, $50 copay.  Buy and bill ok.

## 2020-05-23 NOTE — Progress Notes (Signed)
Submitted online [id number] for Durolane bilateral knees.

## 2020-05-23 NOTE — Progress Notes (Signed)
Message Received: Luke Chavez, RT  Zaryah Seckel, RT Dr Aline Brochure wants to do hyaluronic acid injections on patient  Will you please get approval whatever his [employer] will cover>?

## 2020-05-29 ENCOUNTER — Telehealth: Payer: Self-pay | Admitting: Orthopedic Surgery

## 2020-05-29 NOTE — Progress Notes (Signed)
Have you seen anything come on the fax from [facility] on this one?

## 2020-05-29 NOTE — Progress Notes (Signed)
Auth # [id number], valid dates 05-23-20 thru 11-18-20. I will call patient to schedule.

## 2020-05-29 NOTE — Telephone Encounter (Signed)
I called patient LM for him to call me back.  Durolane inj bilateral knees, needs one appt.  I will order meds.

## 2020-05-29 NOTE — Telephone Encounter (Signed)
Patient called to inquire about when he will get the gel injections per approval by [facility] as noted, and as per patient with [employer]. States has been in a lot of pain with both knees. Please advise.

## 2020-05-29 NOTE — Telephone Encounter (Signed)
Patient Returned call. Per Abigail Butts, okay to schedule as soon as Thursday, or Monday of next week* *Scheduled for first available, Monday, 06/05/20, 2:30pm; patient aware.

## 2020-05-30 NOTE — Telephone Encounter (Signed)
Patient scheduled, Durolane ordered.  I will bring the injections here for you from [facility] office.

## 2020-06-05 ENCOUNTER — Other Ambulatory Visit: Payer: Self-pay

## 2020-06-05 ENCOUNTER — Ambulatory Visit (INDEPENDENT_AMBULATORY_CARE_PROVIDER_SITE_OTHER): Payer: BLUE CROSS/BLUE SHIELD | Admitting: Orthopedic Surgery

## 2020-06-05 DIAGNOSIS — M1710 Unilateral primary osteoarthritis, unspecified knee: Secondary | ICD-10-CM

## 2020-06-05 DIAGNOSIS — M170 Bilateral primary osteoarthritis of knee: Secondary | ICD-10-CM | POA: Diagnosis not present

## 2020-06-05 DIAGNOSIS — G8929 Other chronic pain: Secondary | ICD-10-CM

## 2020-06-05 DIAGNOSIS — M25561 Pain in right knee: Secondary | ICD-10-CM

## 2020-06-05 NOTE — Progress Notes (Signed)
Chief Complaint  Patient presents with  . Knee Pain    Bilateral/ hurting pretty bad/here for injections   1/3 both knees   durolane   First of 3 injections both knees with Durolane for bilateral knee arthritis  First procedure inject right knee with Durolane The knee was prepped with alcohol and ethyl chloride and injected with 1 vial of Durolane  Second procedure inject left knee with Durolane The knee was prepped with alcohol and ethyl chloride and injected with 1 vial of Durolane   Encounter Diagnoses  Name Primary?  . Primary localized osteoarthritis of knee right knee and left knee Yes  . Bilateral chronic knee pain

## 2020-06-05 NOTE — Telephone Encounter (Signed)
Done

## 2020-06-12 ENCOUNTER — Ambulatory Visit: Payer: BLUE CROSS/BLUE SHIELD | Admitting: Orthopedic Surgery

## 2020-06-12 ENCOUNTER — Telehealth: Payer: Self-pay | Admitting: Radiology

## 2020-06-12 NOTE — Telephone Encounter (Signed)
I called patient to cancel his appointment for today his injection Durolane is one and done, not series  You may need to addend his last dose  Durolane can be single injection OR series, and due to cost he has done one only   FYI only

## 2020-07-04 DIAGNOSIS — M4856XA Collapsed vertebra, not elsewhere classified, lumbar region, initial encounter for fracture: Secondary | ICD-10-CM | POA: Insufficient documentation

## 2020-07-04 DIAGNOSIS — M545 Low back pain, unspecified: Secondary | ICD-10-CM | POA: Insufficient documentation

## 2020-07-04 DIAGNOSIS — M4150 Other secondary scoliosis, site unspecified: Secondary | ICD-10-CM | POA: Insufficient documentation

## 2020-09-01 DIAGNOSIS — M170 Bilateral primary osteoarthritis of knee: Secondary | ICD-10-CM | POA: Diagnosis not present

## 2020-11-20 ENCOUNTER — Telehealth: Payer: Self-pay | Admitting: Orthopedic Surgery

## 2020-11-20 DIAGNOSIS — M1710 Unilateral primary osteoarthritis, unspecified knee: Secondary | ICD-10-CM

## 2020-11-20 NOTE — Telephone Encounter (Signed)
Patient called to ask if he may have the Durolane injections again - his last visit was 06/05/20. Schedule appointment again, or may he order another set of injections? Please advise.

## 2020-11-20 NOTE — Telephone Encounter (Signed)
Would need to be after Oct 4th, have put in for Abigail Butts to check on the authorization  I will call him when I am not in clinic.

## 2020-11-21 NOTE — Telephone Encounter (Signed)
I called him spoke to him he wants to do this before end of October his deductible starts over in November  To Gae Bon

## 2020-12-07 ENCOUNTER — Other Ambulatory Visit: Payer: Self-pay

## 2020-12-07 ENCOUNTER — Ambulatory Visit: Payer: BLUE CROSS/BLUE SHIELD | Admitting: Orthopedic Surgery

## 2020-12-07 DIAGNOSIS — G8929 Other chronic pain: Secondary | ICD-10-CM

## 2020-12-07 DIAGNOSIS — M25561 Pain in right knee: Secondary | ICD-10-CM

## 2020-12-07 DIAGNOSIS — M170 Bilateral primary osteoarthritis of knee: Secondary | ICD-10-CM | POA: Diagnosis not present

## 2020-12-07 NOTE — Progress Notes (Signed)
Chief Complaint  Patient presents with   Follow-up    Durolane injections both knees single shot    Durolane inj right and left knee   Procedure note for injection of hyaluronic acid   Diagnosis osteoarthritis of the knee  Verbal consent was obtained to inject the knee with HYALURONIC ACID . Timeout was completed to confirm the injection site as the left    Knee  Ethyl chloride spray was used for anesthesia Alcohol was used to prep the skin. The infrapatellar lateral portal was used as an injection site and 1 vial of hyaluronic acid  was injected into the knee  Specific Co. Preparation: durolane 1 shot   No complications were noted   Procedure note for injection of hyaluronic acid   Diagnosis osteoarthritis of the knee  Verbal consent was obtained to inject the knee with HYALURONIC ACID . Timeout was completed to confirm the injection site as the right    Knee  Ethyl chloride spray was used for anesthesia Alcohol was used to prep the skin. The infrapatellar lateral portal was used as an injection site and 1 vial of hyaluronic acid  was injected into the knee  Specific Co. Preparation: durolane 1 shot   No complications were noted

## 2021-01-01 DIAGNOSIS — Z6835 Body mass index (BMI) 35.0-35.9, adult: Secondary | ICD-10-CM | POA: Diagnosis not present

## 2021-01-01 DIAGNOSIS — L309 Dermatitis, unspecified: Secondary | ICD-10-CM | POA: Diagnosis not present

## 2021-01-01 DIAGNOSIS — I10 Essential (primary) hypertension: Secondary | ICD-10-CM | POA: Diagnosis not present

## 2021-04-16 ENCOUNTER — Telehealth: Payer: Self-pay | Admitting: Orthopedic Surgery

## 2021-04-16 NOTE — Telephone Encounter (Signed)
Patient called to ask about scheduling appointment with Dr Aline Brochure to discuss "knee replacement s in both knees". Discussed appointment,and also whether he has been seen for any treatment by any other provider, and he said he has been to [facility]; states his primary care referred him there. Patient will request notes; said did not have Xrays or MRI there, and will call back.

## 2021-05-01 ENCOUNTER — Encounter: Payer: Self-pay | Admitting: Orthopedic Surgery

## 2021-05-14 ENCOUNTER — Ambulatory Visit: Payer: BC Managed Care – PPO | Admitting: Orthopedic Surgery

## 2021-05-14 ENCOUNTER — Other Ambulatory Visit: Payer: Self-pay

## 2021-05-14 ENCOUNTER — Encounter: Payer: Self-pay | Admitting: Orthopedic Surgery

## 2021-05-14 VITALS — BP 142/102 | HR 87 | Ht 74.0 in | Wt 272.0 lb

## 2021-05-14 DIAGNOSIS — G8929 Other chronic pain: Secondary | ICD-10-CM

## 2021-05-14 DIAGNOSIS — M1712 Unilateral primary osteoarthritis, left knee: Secondary | ICD-10-CM | POA: Diagnosis not present

## 2021-05-14 DIAGNOSIS — M170 Bilateral primary osteoarthritis of knee: Secondary | ICD-10-CM

## 2021-05-14 NOTE — Progress Notes (Addendum)
Chief Complaint  ?Patient presents with  ? Knee Pain  ?  Bilateral left worse than right  ? ? ?Encounter Diagnoses  ?Name Primary?  ? Bilateral chronic knee pain Yes  ? Primary osteoarthritis of both knees   ? ?56 year old male with left knee pain has been to see [facility] Dr. Alvan Dame for second opinion regarding his arthritis ? ?He is says he is come to the end of the road in terms of dealing with his arthritic knee pain he has had meloxicam injections including hyaluronic acid and has had an exercise program as well he has hypertension he takes 2 medications ? ?He complains of severe disabling left knee pain and wishes to proceed with left total knee ? ?I was able to get the notes from Dr. Paralee Cancel dated September 01, 2020 ? ?Dr. Alvan Dame  recommended knee replacements ? ? ?Past Medical History:  ?Diagnosis Date  ? Arthritis   ? Chronic back pain   ? Hypertension   ? ? ? ?Current Outpatient Medications:  ?  hydrochlorothiazide (HYDRODIURIL) 25 MG tablet, Take 25 mg by mouth daily., Disp: , Rfl:  ?  losartan (COZAAR) 100 MG tablet, Take 100 mg by mouth daily., Disp: , Rfl:  ?  meloxicam (MOBIC) 15 MG tablet, Take 15 mg by mouth daily., Disp: , Rfl:  ?Social History  ? ?Tobacco Use  ? Smoking status: Former  ?  Types: Cigarettes  ?  Quit date: 04/22/2009  ?  Years since quitting: 12.0  ? Smokeless tobacco: Never  ?Substance Use Topics  ? Alcohol use: No  ? Drug use: Yes  ?  Types: Cocaine, Marijuana  ?  Comment: former  ? ?Past Surgical History:  ?Procedure Laterality Date  ? COLONOSCOPY N/A 04/17/2015  ? Procedure: COLONOSCOPY;  Surgeon: Danie Binder, MD;  Location: [facility];  Service: Endoscopy;  Laterality: N/A;  1:00 PM  ? KNEE ARTHROSCOPY WITH MEDIAL MENISECTOMY Left 04/24/2017  ? Procedure: KNEE ARTHROSCOPY WITH MEDIAL MENISECTOMY;  Surgeon: Carole Civil, MD;  Location: [facility];  Service: Orthopedics;  Laterality: Left;  ? ?Family History  ?Problem Relation Age of Onset  ? Cancer Father   ? Arthritis  Father   ? Arthritis Maternal Aunt   ? Arthritis Maternal Uncle   ? Arthritis Paternal Aunt   ? ? ?BP (!) 142/102   Pulse 87   Ht '6\' 2"'$  (1.88 m)   Wt 272 lb (123.4 kg)   BMI 34.92 kg/m?  ? ?General well-developed well-nourished grooming hygiene normal no gross deformities ? ?Cardiovascular exam no peripheral edema normal extremities in terms of temperature and color ? ?Mood and affect normal ? ?Alert awake and oriented x3 ? ?Left knee skin envelope looks normal ?Tenderness medial joint with effusion ?No flexion contracture, flexion 120 degrees ?Quad strength normal ?Knee stable in all planes ? ?Neurologic normal exam ? ?X-ray shows varus alignment medial compartment disease advanced arthritis with secondary bone changes ? ?Encounter Diagnoses  ?Name Primary?  ? Bilateral chronic knee pain Yes  ? Primary osteoarthritis of both knees   ? ? ?The procedure has been fully reviewed with the patient; The risks and benefits of surgery have been discussed and explained and understood. Alternative treatment has also been reviewed, questions were encouraged and answered. The postoperative plan is also been reviewed. ? ?Left total knee arthroplasty March 24 ?

## 2021-05-14 NOTE — Addendum Note (Signed)
Addended by: Arther Abbott E on: 05/14/2021 01:11 PM ? ? Modules accepted: Orders ? ?

## 2021-05-14 NOTE — Patient Instructions (Signed)
You have decided to proceed with knee replacement surgery. You have decided not to continue with nonoperative measures such as but not limited to oral medication, weight loss, activity modification, physical therapy, bracing, or injection.  We will perform the procedure commonly known as total knee replacement. Some of the risks associated with knee replacement surgery include but are not limited to Bleeding Infection Swelling Stiffness Blood clot Pulmonary embolism  Loosening of the implant Pain that persists even after surgery  Infection is especially devastating complication of knee surgery although rare. If infection does occur your implant will usually have to be removed and several surgeries and antibiotics will be needed to eradicate the infection prior to performing a repeat replacement.   In some cases amputation is required to eradicate the infection. In other rare cases a knee fusion is needed   In compliance with recent [place] law in federal regulation regarding opioid use and abuse and addiction, we will taper (stop) opioid medication after 2 weeks.  If you're not comfortable with these risks and would like to continue with nonoperative treatment please let Dr. Shamica Moree know prior to your surgery.  

## 2021-05-15 ENCOUNTER — Other Ambulatory Visit: Payer: Self-pay

## 2021-05-15 DIAGNOSIS — M1712 Unilateral primary osteoarthritis, left knee: Secondary | ICD-10-CM

## 2021-05-18 NOTE — Patient Instructions (Signed)
? ? ? ? ? ? ? ? ? ? Luke Chavez ? 05/18/2021  ?  ? '@PREFPERIOPPHARMACY'$ @ ? ? Your procedure is scheduled on  05/25/2021. ? ? Report to [facility] at  0600 A.M. ? ? Call this number if you have problems the morning of surgery: ? [phone] ? ? Remember: ? Do not eat or drink after midnight. ?  ?  ? Take these medicines the morning of surgery with A SIP OF WATER  ? ?Mobic(if needed). ? ?  ? Do not wear jewelry, make-up or nail polish. ? Do not wear lotions, powders, or perfumes, or deodorant. ? Do not shave 48 hours prior to surgery.  Men may shave face and neck. ? Do not bring valuables to the hospital. ? [facility] is not responsible for any belongings or valuables. ? ?Contacts, dentures or bridgework may not be worn into surgery.  Leave your suitcase in the car.  After surgery it may be brought to your room. ? ?For patients admitted to the hospital, discharge time will be determined by your treatment team. ? ?Patients discharged the day of surgery will not be allowed to drive home and must have someone with them for 24 hours.  ? ? ?Special instructions:   DO NOT smoke tobacco or vape for 24 hours before your procedure. ? ?Please read over the following fact sheets that you were given. ?Pain Booklet, Coughing and Deep Breathing, Total Joint Packet, Surgical Site Infection Prevention, Anesthesia Post-op Instructions, and Care and Recovery After Surgery ?  ? ? ? Total Knee Replacement, Care After ?This sheet gives you information about how to care for yourself after your procedure. Your health care provider may also give you more specific instructions. If you have problems or questions, contact your health care provider. ?What can I expect after the procedure? ?After the procedure, it is common to have: ?Redness, pain, and swelling at the incision area. ?Stiffness. ?Discomfort. ?A small amount of blood or clear fluid coming from your incision. ?Follow these instructions at home: ?Medicines ?Take over-the-counter  and prescription medicines only as told by your health care provider. ?If you were prescribed a blood thinner (anticoagulant), take it as told by your health care provider. ?Ask your health care provider if the medicine prescribed to you: ?Requires you to avoid driving or using machinery. ?Can cause constipation. You may need to take these actions to prevent or treat constipation: ?Drink enough fluid to keep your urine pale yellow. ?Take over-the-counter or prescription medicines. ?Eat foods that are high in fiber, such as beans, whole grains, and fresh fruits and vegetables. ?Limit foods that are high in fat and processed sugars, such as fried or sweet foods. ?Incision care ? ?Follow instructions from your health care provider about how to take care of your incision. Make sure you: ?Wash your hands with soap and water for at least 20 seconds before and after you change your bandage (dressing). If soap and water are not available, use hand sanitizer. ?Change your dressing as told by your health care provider. ?Leave stitches (sutures), staples, skin glue, or adhesive strips in place. These skin closures may need to stay in place for 2 weeks or longer. If adhesive strip edges start to loosen and curl up, you may trim the loose edges. Do not remove adhesive strips completely unless your health care provider tells you to do that. ?Do not take baths, swim, or use a hot tub until your health care provider approves. ?Check your  incision area every day for signs of infection. Check for: ?More redness, swelling, or pain. ?More fluid or blood. ?Warmth. ?Pus or a bad smell. ?Activity ?Rest as told by your health care provider. ?Avoid sitting for a long time without moving. Get up to take short walks every 1-2 hours. This is important to improve blood flow and breathing. Ask for help if you feel weak or unsteady. ?Follow instructions from your health care provider about using a walker, crutches, or a cane. ?You may use your  legs to support (bear) your body weight as told by your health care provider. Follow instructions about how much weight you may safely support on your affected leg (weight-bearing restrictions). ?A physical therapist may show you how to get out of a bed and chair and how to go up and down stairs. You will first do this with a walker, crutches, or a cane and then without any of these devices. ?Once you are able to walk without a limp, you may stop using a walker, crutches, or a cane. ?Do exercises as told by your health care provider or physical therapist. ?Avoid high-impact activities, including running, jumping rope, and doing jumping jacks. ?Do not play contact sports until your health care provider approves. ?Return to your normal activities as told by your health care provider. Ask your health care provider what activities are safe for you. ?Managing pain, stiffness, and swelling ? ?If directed, put ice on your knee. To do this: ?Put ice in a plastic bag or use the icing device (cold flow pad) that you were given. Follow instructions from your health care provider about how to use the icing device. ?Place a towel between your skin and the bag or between your skin and the icing device. ?Leave the ice on for 20 minutes, 2-3 times a day. ?Remove the ice if your skin turns bright red. This is very important. If you cannot feel pain, heat, or cold, you have a greater risk of damage to the area. ?Move your toes often to reduce stiffness and swelling. ?Raise (elevate) your leg above the level of your heart while you are sitting or lying down. ?Use several pillows to keep your leg straight. ?Do not put a pillow just under the knee. If the knee is bent for a long time, this may lead to stiffness. ?Wear elastic knee support as told by your health care provider. ?Safety ? ?To help prevent falls, keep floors clear of objects you may trip over. Place items that you may need within easy reach. ?Wear an apron or tool belt with  pockets for carrying objects. This leaves your hands free to help with your balance. ?Ask your health care provider when it is safe to drive. ?General instructions ?Wear compression stockings as told by your health care provider. These stockings help to prevent blood clots and reduce swelling in your legs. ?Continue with breathing exercises. This helps prevent lung infection. ?Do not use any products that contain nicotine or tobacco. These products include cigarettes, chewing tobacco, and vaping devices, such as e-cigarettes. These can delay healing after surgery. If you need help quitting, ask your health care provider. ?Tell your health care provider if you plan to have dental work. Also: ?Tell your dentist about your joint replacement. ?Ask your health care provider if there are any special instructions you need to follow before having dental care and routine cleanings. ?Keep all follow-up visits. This is important. ?Contact a health care provider if: ?You have a fever  or chills. ?You have a cough or feel short of breath. ?Your medicine is not controlling your pain. ?You have any of these signs of infection: ?More redness, swelling, or pain around your incision. ?More fluid or blood coming from your incision. ?Warmth coming from your incision. ?Pus or a bad smell coming from your incision. ?You fall. ?Get help right away if: ?You have severe pain. ?You have trouble breathing. ?You have chest pain. ?You have redness, swelling, pain, or warmth in your calf or leg. ?Your incision breaks open after sutures or staples are removed. ?These symptoms may represent a serious problem that is an emergency. Do not wait to see if the symptoms will go away. Get medical help right away. Call your local emergency services (911 in the [place] Do not drive yourself to the hospital. ?Summary ?After the procedure, it is common to have pain and swelling at the incision area, a small amount of blood or fluid coming from your incision,  and stiffness. ?Follow instructions from your health care provider about how to take care of your incision. ?Use crutches, a walker, or a cane as told by your health care provider. ?This information is

## 2021-05-21 NOTE — Pre-Procedure Instructions (Signed)
?  RE: type and cross ?Received: 3 days ago ?Carole Civil, MD  Encarnacion Chu, RN ?Yes   ?  ?   ?Previous Messages ?  ?----- Message -----  ?From: Encarnacion Chu, RN  ?Sent: 05/18/2021   2:05 PM EDT  ?To: Carole Civil, MD  ?Subject: type and cross                                ? ?Hey Dr Aline Brochure. I didn't see an order for a type and screen or a type and cross in Luke Chavez's orders. DO you want either of those added?  ?  ?

## 2021-05-22 ENCOUNTER — Encounter (HOSPITAL_COMMUNITY)
Admission: RE | Admit: 2021-05-22 | Discharge: 2021-05-22 | Disposition: A | Discharge status: Home / Self Care | Payer: BC Managed Care – PPO | Source: Ambulatory Visit | Attending: Orthopedic Surgery | Admitting: Orthopedic Surgery

## 2021-05-22 ENCOUNTER — Encounter (HOSPITAL_COMMUNITY): Payer: Self-pay

## 2021-05-22 ENCOUNTER — Other Ambulatory Visit (HOSPITAL_COMMUNITY)
Admission: RE | Admit: 2021-05-22 | Discharge: 2021-05-22 | Disposition: A | Discharge status: Home / Self Care | Payer: BC Managed Care – PPO | Source: Ambulatory Visit | Attending: Orthopedic Surgery | Admitting: Orthopedic Surgery

## 2021-05-22 VITALS — BP 132/98 | HR 76 | Temp 97.8°F | Resp 18 | Ht 74.0 in | Wt 272.0 lb

## 2021-05-22 DIAGNOSIS — Z01812 Encounter for preprocedural laboratory examination: Secondary | ICD-10-CM | POA: Diagnosis not present

## 2021-05-22 DIAGNOSIS — Z20822 Contact with and (suspected) exposure to covid-19: Secondary | ICD-10-CM | POA: Insufficient documentation

## 2021-05-22 DIAGNOSIS — Z01818 Encounter for other preprocedural examination: Secondary | ICD-10-CM | POA: Insufficient documentation

## 2021-05-22 DIAGNOSIS — M1712 Unilateral primary osteoarthritis, left knee: Secondary | ICD-10-CM | POA: Insufficient documentation

## 2021-05-22 DIAGNOSIS — M1710 Unilateral primary osteoarthritis, unspecified knee: Secondary | ICD-10-CM | POA: Insufficient documentation

## 2021-05-22 LAB — CBC WITH DIFFERENTIAL/PLATELET
Abs Immature Granulocytes: 0.06 10*3/uL (ref 0.00–0.07)
Basophils Absolute: 0 10*3/uL (ref 0.0–0.1)
Basophils Relative: 1 %
Eosinophils Absolute: 0.2 10*3/uL (ref 0.0–0.5)
Eosinophils Relative: 3 %
HCT: 43.4 % (ref 39.0–52.0)
Hemoglobin: 14.3 g/dL (ref 13.0–17.0)
Immature Granulocytes: 1 %
Lymphocytes Relative: 20 %
Lymphs Abs: 1.1 10*3/uL (ref 0.7–4.0)
MCH: 28.8 pg (ref 26.0–34.0)
MCHC: 32.9 g/dL (ref 30.0–36.0)
MCV: 87.3 fL (ref 80.0–100.0)
Monocytes Absolute: 0.5 10*3/uL (ref 0.1–1.0)
Monocytes Relative: 10 %
Neutro Abs: 3.6 10*3/uL (ref 1.7–7.7)
Neutrophils Relative %: 65 %
Platelets: 226 10*3/uL (ref 150–400)
RBC: 4.97 MIL/uL (ref 4.22–5.81)
RDW: 12.7 % (ref 11.5–15.5)
WBC: 5.5 10*3/uL (ref 4.0–10.5)
nRBC: 0 % (ref 0.0–0.2)

## 2021-05-22 LAB — BASIC METABOLIC PANEL
Anion gap: 8 (ref 5–15)
BUN: 18 mg/dL (ref 6–20)
CO2: 27 mmol/L (ref 22–32)
Calcium: 10 mg/dL (ref 8.9–10.3)
Chloride: 103 mmol/L (ref 98–111)
Creatinine, Ser: 0.82 mg/dL (ref 0.61–1.24)
GFR, Estimated: >60 mL/min (ref >60)
Glucose, Bld: 102 mg/dL — ABNORMAL HIGH (ref 70–99)
Potassium: 3.6 mmol/L (ref 3.5–5.1)
Sodium: 138 mmol/L (ref 135–145)

## 2021-05-22 LAB — PREPARE RBC (CROSSMATCH)

## 2021-05-22 LAB — TYPE AND SCREEN
ABO/RH(D): O POS
Antibody Screen: NEGATIVE

## 2021-05-22 LAB — APTT: aPTT: 27 seconds (ref 24–36)

## 2021-05-22 LAB — PROTIME-INR
INR: 1 (ref 0.8–1.2)
Prothrombin Time: 13.1 seconds (ref 11.4–15.2)

## 2021-05-25 ENCOUNTER — Ambulatory Visit (HOSPITAL_COMMUNITY): Payer: BC Managed Care – PPO | Admitting: Anesthesiology

## 2021-05-25 ENCOUNTER — Other Ambulatory Visit: Payer: Self-pay

## 2021-05-25 ENCOUNTER — Ambulatory Visit (HOSPITAL_COMMUNITY): Payer: BC Managed Care – PPO

## 2021-05-25 ENCOUNTER — Encounter (HOSPITAL_COMMUNITY)
Admission: RE | Disposition: A | Discharge status: Home Health | Payer: Self-pay | Source: Home / Self Care | Attending: Orthopedic Surgery

## 2021-05-25 ENCOUNTER — Encounter (HOSPITAL_COMMUNITY): Payer: Self-pay | Admitting: Orthopedic Surgery

## 2021-05-25 ENCOUNTER — Observation Stay (HOSPITAL_COMMUNITY)
Admission: RE | Admit: 2021-05-25 | Discharge: 2021-05-26 | Disposition: A | Discharge status: Home Health | Payer: BC Managed Care – PPO | Attending: Orthopedic Surgery | Admitting: Orthopedic Surgery

## 2021-05-25 DIAGNOSIS — Z87891 Personal history of nicotine dependence: Secondary | ICD-10-CM | POA: Diagnosis not present

## 2021-05-25 DIAGNOSIS — M1712 Unilateral primary osteoarthritis, left knee: Secondary | ICD-10-CM | POA: Diagnosis not present

## 2021-05-25 DIAGNOSIS — G8918 Other acute postprocedural pain: Secondary | ICD-10-CM | POA: Diagnosis not present

## 2021-05-25 DIAGNOSIS — I10 Essential (primary) hypertension: Secondary | ICD-10-CM | POA: Diagnosis not present

## 2021-05-25 DIAGNOSIS — Z96652 Presence of left artificial knee joint: Secondary | ICD-10-CM | POA: Diagnosis not present

## 2021-05-25 DIAGNOSIS — M7989 Other specified soft tissue disorders: Secondary | ICD-10-CM | POA: Diagnosis not present

## 2021-05-25 DIAGNOSIS — Z471 Aftercare following joint replacement surgery: Secondary | ICD-10-CM | POA: Diagnosis not present

## 2021-05-25 DIAGNOSIS — Z79899 Other long term (current) drug therapy: Secondary | ICD-10-CM | POA: Diagnosis not present

## 2021-05-25 DIAGNOSIS — Z96642 Presence of left artificial hip joint: Secondary | ICD-10-CM | POA: Diagnosis not present

## 2021-05-25 HISTORY — PX: TOTAL KNEE ARTHROPLASTY: SHX125

## 2021-05-25 LAB — ABO/RH: ABO/RH(D): O POS

## 2021-05-25 SURGERY — ARTHROPLASTY, KNEE, TOTAL
Anesthesia: Spinal | Site: Knee | Laterality: Left

## 2021-05-25 MED ORDER — ONDANSETRON HCL 4 MG/2ML IJ SOLN
4.0000 mg | Freq: Once | INTRAMUSCULAR | Status: DC | PRN
  Filled 2021-05-25: qty 2

## 2021-05-25 MED ORDER — BUPIVACAINE-MELOXICAM ER 200-6 MG/7ML IJ SOLN
INTRAMUSCULAR | Status: DC | PRN
Start: 2021-05-25 — End: 2021-05-25
  Administered 2021-05-25: 400 mg

## 2021-05-25 MED ORDER — PROPOFOL 10 MG/ML IV BOLUS
INTRAVENOUS | Status: AC
  Filled 2021-05-25: qty 20

## 2021-05-25 MED ORDER — FENTANYL CITRATE (PF) 100 MCG/2ML IJ SOLN
INTRAMUSCULAR | Status: AC
  Filled 2021-05-25: qty 2

## 2021-05-25 MED ORDER — METOCLOPRAMIDE HCL 5 MG/ML IJ SOLN: 5.0000 mg | Freq: Three times a day (TID) | INTRAMUSCULAR | Status: DC | PRN

## 2021-05-25 MED ORDER — FENTANYL CITRATE (PF) 100 MCG/2ML IJ SOLN
INTRAMUSCULAR | Status: DC | PRN
Start: 2021-05-25 — End: 2021-05-25
  Administered 2021-05-25 (×2): 50 ug via INTRAVENOUS

## 2021-05-25 MED ORDER — DOCUSATE SODIUM 100 MG PO CAPS
100.0000 mg | ORAL_CAPSULE | Freq: Two times a day (BID) | ORAL | Status: DC
  Administered 2021-05-25 – 2021-05-26 (×3): 100 mg via ORAL
  Filled 2021-05-25 (×3): qty 1

## 2021-05-25 MED ORDER — ONDANSETRON HCL 4 MG/2ML IJ SOLN: 4.0000 mg | Freq: Four times a day (QID) | INTRAMUSCULAR | Status: DC | PRN

## 2021-05-25 MED ORDER — BISACODYL 10 MG RE SUPP: 10.0000 mg | Freq: Every day | RECTAL | Status: DC | PRN

## 2021-05-25 MED ORDER — DEXAMETHASONE SODIUM PHOSPHATE 4 MG/ML IJ SOLN
INTRAMUSCULAR | Status: DC | PRN
  Administered 2021-05-25: 8 mg via PERINEURAL

## 2021-05-25 MED ORDER — LIDOCAINE HCL (PF) 1 % IJ SOLN
INTRAMUSCULAR | Status: DC | PRN
  Administered 2021-05-25: 3 mL

## 2021-05-25 MED ORDER — MORPHINE SULFATE (PF) 2 MG/ML IV SOLN: 0.5000 mg | INTRAVENOUS | Status: DC | PRN

## 2021-05-25 MED ORDER — DEXTROSE 5 % IV SOLN
500.0000 mg | Freq: Once | INTRAVENOUS | Status: AC
  Administered 2021-05-25: 500 mg via INTRAVENOUS
  Filled 2021-05-25: qty 5

## 2021-05-25 MED ORDER — ALUM & MAG HYDROXIDE-SIMETH 200-200-20 MG/5ML PO SUSP: 30.0000 mL | ORAL | Status: DC | PRN

## 2021-05-25 MED ORDER — TRAMADOL HCL 50 MG PO TABS
50.0000 mg | ORAL_TABLET | Freq: Four times a day (QID) | ORAL | Status: DC
  Administered 2021-05-25 – 2021-05-26 (×5): 50 mg via ORAL
  Filled 2021-05-25 (×5): qty 1

## 2021-05-25 MED ORDER — DEXAMETHASONE SODIUM PHOSPHATE 10 MG/ML IJ SOLN
10.0000 mg | Freq: Once | INTRAMUSCULAR | Status: AC
  Administered 2021-05-26: 10 mg via INTRAVENOUS
  Filled 2021-05-25: qty 1

## 2021-05-25 MED ORDER — ORAL CARE MOUTH RINSE: 15.0000 mL | Freq: Once | OROMUCOSAL | Status: AC

## 2021-05-25 MED ORDER — OXYCODONE HCL 5 MG PO TABS
ORAL_TABLET | ORAL | Status: AC
  Filled 2021-05-25: qty 1

## 2021-05-25 MED ORDER — HYDROCHLOROTHIAZIDE 25 MG PO TABS
25.0000 mg | ORAL_TABLET | Freq: Every day | ORAL | Status: DC
  Administered 2021-05-25 – 2021-05-26 (×2): 25 mg via ORAL
  Filled 2021-05-25 (×2): qty 1

## 2021-05-25 MED ORDER — SODIUM CHLORIDE 0.9 % IR SOLN
Status: DC | PRN
Start: 2021-05-25 — End: 2021-05-25
  Administered 2021-05-25: 3000 mL

## 2021-05-25 MED ORDER — METHOCARBAMOL 1000 MG/10ML IJ SOLN: 500.0000 mg | Freq: Four times a day (QID) | INTRAVENOUS | Status: DC | PRN

## 2021-05-25 MED ORDER — POLYETHYLENE GLYCOL 3350 17 G PO PACK
17.0000 g | PACK | Freq: Every day | ORAL | Status: DC
  Administered 2021-05-25 – 2021-05-26 (×2): 17 g via ORAL
  Filled 2021-05-25 (×2): qty 1

## 2021-05-25 MED ORDER — HYDROCODONE-ACETAMINOPHEN 5-325 MG PO TABS: 1.0000 | ORAL_TABLET | ORAL | Status: DC | PRN

## 2021-05-25 MED ORDER — LACTATED RINGERS IV SOLN
INTRAVENOUS | Status: DC
  Administered 2021-05-25: 1000 mL via INTRAVENOUS

## 2021-05-25 MED ORDER — FLEET ENEMA 7-19 GM/118ML RE ENEM: 1.0000 | ENEMA | Freq: Once | RECTAL | Status: DC | PRN

## 2021-05-25 MED ORDER — HYDROCODONE-ACETAMINOPHEN 7.5-325 MG PO TABS
1.0000 | ORAL_TABLET | ORAL | Status: DC | PRN
  Administered 2021-05-25: 2 via ORAL
  Filled 2021-05-25: qty 2

## 2021-05-25 MED ORDER — CHLORHEXIDINE GLUCONATE 0.12 % MT SOLN
15.0000 mL | Freq: Once | OROMUCOSAL | Status: AC
  Administered 2021-05-25: 15 mL via OROMUCOSAL

## 2021-05-25 MED ORDER — BUPIVACAINE HCL (PF) 0.25 % IJ SOLN
INTRAMUSCULAR | Status: DC | PRN
  Administered 2021-05-25: 30 mL via PERINEURAL

## 2021-05-25 MED ORDER — ACETAMINOPHEN 325 MG PO TABS: 325.0000 mg | ORAL_TABLET | Freq: Four times a day (QID) | ORAL | Status: DC | PRN

## 2021-05-25 MED ORDER — TRANEXAMIC ACID-NACL 1000-0.7 MG/100ML-% IV SOLN
1000.0000 mg | Freq: Once | INTRAVENOUS | Status: AC
  Administered 2021-05-25: 1000 mg via INTRAVENOUS
  Filled 2021-05-25: qty 100

## 2021-05-25 MED ORDER — PHENOL 1.4 % MT LIQD: 1.0000 | OROMUCOSAL | Status: DC | PRN

## 2021-05-25 MED ORDER — PREGABALIN 50 MG PO CAPS
50.0000 mg | ORAL_CAPSULE | Freq: Once | ORAL | Status: AC
Start: 2021-05-25 — End: 2021-05-25
  Administered 2021-05-25: 50 mg via ORAL

## 2021-05-25 MED ORDER — ASPIRIN EC 325 MG PO TBEC
325.0000 mg | DELAYED_RELEASE_TABLET | Freq: Every day | ORAL | Status: DC
  Administered 2021-05-26: 325 mg via ORAL
  Filled 2021-05-25: qty 1

## 2021-05-25 MED ORDER — TRANEXAMIC ACID-NACL 1000-0.7 MG/100ML-% IV SOLN
1000.0000 mg | INTRAVENOUS | Status: AC
  Administered 2021-05-25: 1000 mg via INTRAVENOUS
  Filled 2021-05-25: qty 100

## 2021-05-25 MED ORDER — LOSARTAN POTASSIUM 50 MG PO TABS
100.0000 mg | ORAL_TABLET | Freq: Every day | ORAL | Status: DC
  Administered 2021-05-25 – 2021-05-26 (×2): 100 mg via ORAL
  Filled 2021-05-25 (×2): qty 2

## 2021-05-25 MED ORDER — SODIUM CHLORIDE 0.9 % IV SOLN: INTRAVENOUS | Status: AC

## 2021-05-25 MED ORDER — HYDROMORPHONE HCL 1 MG/ML IJ SOLN: 0.2500 mg | INTRAMUSCULAR | Status: DC | PRN

## 2021-05-25 MED ORDER — DEXAMETHASONE SODIUM PHOSPHATE 4 MG/ML IJ SOLN
INTRAMUSCULAR | Status: AC
  Filled 2021-05-25: qty 2

## 2021-05-25 MED ORDER — 0.9 % SODIUM CHLORIDE (POUR BTL) OPTIME
TOPICAL | Status: DC | PRN
  Administered 2021-05-25: 1000 mL

## 2021-05-25 MED ORDER — LIDOCAINE HCL (PF) 1 % IJ SOLN
INTRAMUSCULAR | Status: AC
  Filled 2021-05-25: qty 30

## 2021-05-25 MED ORDER — METHOCARBAMOL 500 MG PO TABS: 500.0000 mg | ORAL_TABLET | Freq: Four times a day (QID) | ORAL | Status: DC | PRN

## 2021-05-25 MED ORDER — CEFAZOLIN IN SODIUM CHLORIDE 3-0.9 GM/100ML-% IV SOLN
3.0000 g | INTRAVENOUS | Status: AC
  Administered 2021-05-25: 3 g via INTRAVENOUS
  Filled 2021-05-25: qty 100

## 2021-05-25 MED ORDER — CEFAZOLIN SODIUM-DEXTROSE 2-4 GM/100ML-% IV SOLN
2.0000 g | Freq: Four times a day (QID) | INTRAVENOUS | Status: AC
  Administered 2021-05-25 (×2): 2 g via INTRAVENOUS
  Filled 2021-05-25 (×2): qty 100

## 2021-05-25 MED ORDER — MIDAZOLAM HCL 2 MG/2ML IJ SOLN
INTRAMUSCULAR | Status: AC
  Filled 2021-05-25: qty 2

## 2021-05-25 MED ORDER — PREGABALIN 50 MG PO CAPS
ORAL_CAPSULE | ORAL | Status: AC
  Filled 2021-05-25: qty 1

## 2021-05-25 MED ORDER — ACETAMINOPHEN 500 MG PO TABS
500.0000 mg | ORAL_TABLET | Freq: Four times a day (QID) | ORAL | Status: AC
  Administered 2021-05-25 – 2021-05-26 (×4): 500 mg via ORAL
  Filled 2021-05-25 (×4): qty 1

## 2021-05-25 MED ORDER — OXYCODONE HCL 5 MG PO TABS
5.0000 mg | ORAL_TABLET | Freq: Once | ORAL | Status: AC
  Administered 2021-05-25: 5 mg via ORAL

## 2021-05-25 MED ORDER — BUPIVACAINE HCL (PF) 0.25 % IJ SOLN
INTRAMUSCULAR | Status: AC
  Filled 2021-05-25: qty 30

## 2021-05-25 MED ORDER — DIPHENHYDRAMINE HCL 12.5 MG/5ML PO ELIX: 12.5000 mg | ORAL_SOLUTION | ORAL | Status: DC | PRN

## 2021-05-25 MED ORDER — ONDANSETRON HCL 4 MG PO TABS: 4.0000 mg | ORAL_TABLET | Freq: Four times a day (QID) | ORAL | Status: DC | PRN

## 2021-05-25 MED ORDER — MEPERIDINE HCL 50 MG/ML IJ SOLN: 6.2500 mg | INTRAMUSCULAR | Status: DC | PRN

## 2021-05-25 MED ORDER — METOCLOPRAMIDE HCL 10 MG PO TABS: 5.0000 mg | ORAL_TABLET | Freq: Three times a day (TID) | ORAL | Status: DC | PRN

## 2021-05-25 MED ORDER — POVIDONE-IODINE 10 % EX SWAB: 2.0000 "application " | Freq: Once | CUTANEOUS | Status: DC

## 2021-05-25 MED ORDER — MIDAZOLAM HCL 5 MG/5ML IJ SOLN
INTRAMUSCULAR | Status: DC | PRN
Start: 2021-05-25 — End: 2021-05-25
  Administered 2021-05-25: 2 mg via INTRAVENOUS

## 2021-05-25 MED ORDER — BUPIVACAINE-MELOXICAM ER 200-6 MG/7ML IJ SOLN
INTRAMUSCULAR | Status: AC
  Filled 2021-05-25: qty 2

## 2021-05-25 MED ORDER — PROPOFOL 500 MG/50ML IV EMUL
INTRAVENOUS | Status: DC | PRN
  Administered 2021-05-25: 200 ug/kg/min via INTRAVENOUS

## 2021-05-25 MED ORDER — ONDANSETRON HCL 4 MG/2ML IJ SOLN
4.0000 mg | Freq: Once | INTRAMUSCULAR | Status: AC
  Administered 2021-05-25: 4 mg via INTRAVENOUS

## 2021-05-25 MED ORDER — MENTHOL 3 MG MT LOZG: 1.0000 | LOZENGE | OROMUCOSAL | Status: DC | PRN

## 2021-05-25 MED ORDER — BUPIVACAINE IN DEXTROSE 0.75-8.25 % IT SOLN
INTRATHECAL | Status: DC | PRN
  Administered 2021-05-25: 2 mL via INTRATHECAL

## 2021-05-25 SURGICAL SUPPLY — 60 items
ATTUNE MED DOME PAT 41 KNEE (Knees) ×1 IMPLANT
ATTUNE PS FEM LT SZ 8 CEM KNEE (Femur) ×1 IMPLANT
BANDAGE ESMARK 6X9 LF (GAUZE/BANDAGES/DRESSINGS) IMPLANT
BASE TIBIAL CEM ATTUNE SZ 7 (Knees) ×2 IMPLANT
BASEPLATE TIB CEM ATTUNE SZ7 (Knees) IMPLANT
BLADE SAGITTAL 25.0X1.27X90 (BLADE) ×3 IMPLANT
BLADE SAW SGTL 11.0X1.19X90.0M (BLADE) ×3 IMPLANT
BLADE SURG SZ10 CARB STEEL (BLADE) ×3 IMPLANT
BNDG CMPR 9X6 STRL LF SNTH (GAUZE/BANDAGES/DRESSINGS) ×1
BNDG ESMARK 6X9 LF (GAUZE/BANDAGES/DRESSINGS) ×2
BSPLAT TIB 7 CMNT FX BRNG STRL (Knees) ×1 IMPLANT
CEMENT HV SMART SET (Cement) ×6 IMPLANT
CLOTH BEACON ORANGE TIMEOUT ST (SAFETY) ×3 IMPLANT
COOLER ICEMAN CLASSIC (MISCELLANEOUS) ×3 IMPLANT
COVER LIGHT HANDLE STERIS (MISCELLANEOUS) ×6 IMPLANT
CUFF TOURN SGL QUICK 34 (TOURNIQUET CUFF) ×2
CUFF TRNQT CYL 34X4.125X (TOURNIQUET CUFF) ×2 IMPLANT
DRAPE BACK TABLE (DRAPES) ×3 IMPLANT
DRAPE EXTREMITY T 121X128X90 (DISPOSABLE) ×3 IMPLANT
DRESSING AQUACEL AG ADV 3.5X12 (MISCELLANEOUS) ×2 IMPLANT
DRSG AQUACEL AG ADV 3.5X12 (MISCELLANEOUS) ×2
DURAPREP 26ML APPLICATOR (WOUND CARE) ×6 IMPLANT
ELECT REM PT RETURN 9FT ADLT (ELECTROSURGICAL) ×2
ELECTRODE REM PT RTRN 9FT ADLT (ELECTROSURGICAL) ×2 IMPLANT
GLOVE SS N UNI LF 8.5 STRL (GLOVE) ×3 IMPLANT
GLOVE SURG POLYISO LF SZ7 (GLOVE) ×3 IMPLANT
GLOVE SURG POLYISO LF SZ8 (GLOVE) ×6 IMPLANT
GLOVE SURG UNDER POLY LF SZ7 (GLOVE) ×7 IMPLANT
GOWN STRL REUS W/ TWL XL LVL3 (GOWN DISPOSABLE) IMPLANT
GOWN STRL REUS W/TWL LRG LVL3 (GOWN DISPOSABLE) ×8 IMPLANT
GOWN STRL REUS W/TWL XL LVL3 (GOWN DISPOSABLE) ×5 IMPLANT
HANDPIECE INTERPULSE COAX TIP (DISPOSABLE) ×2
HOOD W/PEELAWAY (MISCELLANEOUS) ×12 IMPLANT
INSERT TIB ATTUNE FB SZ8X12 (Insert) ×1 IMPLANT
INST SET MAJOR BONE (KITS) ×3 IMPLANT
IV NS IRRIG 3000ML ARTHROMATIC (IV SOLUTION) ×3 IMPLANT
KIT BLADEGUARD II DBL (SET/KITS/TRAYS/PACK) ×3 IMPLANT
KIT TURNOVER KIT A (KITS) ×3 IMPLANT
MANIFOLD NEPTUNE II (INSTRUMENTS) ×3 IMPLANT
MARKER SKIN DUAL TIP RULER LAB (MISCELLANEOUS) ×3 IMPLANT
NS IRRIG 1000ML POUR BTL (IV SOLUTION) ×3 IMPLANT
PACK TOTAL JOINT (CUSTOM PROCEDURE TRAY) ×3 IMPLANT
PAD ARMBOARD 7.5X6 YLW CONV (MISCELLANEOUS) ×3 IMPLANT
PAD COLD SHLDR WRAP-ON (PAD) ×3 IMPLANT
PILLOW KNEE EXTENSION 0 DEG (MISCELLANEOUS) ×3 IMPLANT
SAW OSC TIP CART 19.5X105X1.3 (SAW) ×3 IMPLANT
SET BASIN LINEN APH (SET/KITS/TRAYS/PACK) ×3 IMPLANT
SET HNDPC FAN SPRY TIP SCT (DISPOSABLE) ×2 IMPLANT
SPONGE T-LAP 18X18 ~~LOC~~+RFID (SPONGE) ×7 IMPLANT
STAPLER VISISTAT 35W (STAPLE) ×3 IMPLANT
SUT BRALON NAB BRD #1 30IN (SUTURE) ×4 IMPLANT
SUT MNCRL 0 VIOLET CTX 36 (SUTURE) ×2 IMPLANT
SUT MON AB 0 CT1 (SUTURE) ×3 IMPLANT
SUT MONOCRYL 0 CTX 36 (SUTURE) ×2
SYR BULB IRRIG 60ML STRL (SYRINGE) ×3 IMPLANT
TOWEL OR 17X26 4PK STRL BLUE (TOWEL DISPOSABLE) ×3 IMPLANT
TOWER CARTRIDGE SMART MIX (DISPOSABLE) ×3 IMPLANT
TRAY FOLEY MTR SLVR 16FR STAT (SET/KITS/TRAYS/PACK) ×3 IMPLANT
WATER STERILE IRR 1000ML POUR (IV SOLUTION) ×6 IMPLANT
YANKAUER SUCT 12FT TUBE ARGYLE (SUCTIONS) ×3 IMPLANT

## 2021-05-25 NOTE — Brief Op Note (Signed)
05/25/2021 ? ?10:13 AM ? ?PATIENT:  Luke Chavez  56 y.o. male ? ?PRE-OPERATIVE DIAGNOSIS:  Primary osteoarthritis left knee ? ?POST-OPERATIVE DIAGNOSIS:  Primary osteoarthritis left knee ? ?PROCEDURE:  Procedure(s): ?TOTAL KNEE ARTHROPLASTY (Left) ? ? ?FINDINGS: Grade 4 arthritis medial tibial plateau and femoral condyle osteophytes surrounding the femur and proximal tibia and patella intact medial and lateral menisci intact ACL PCL ? ?No flexion contracture preop ? ?Implants ? ?Attune fixed-bearing posterior stabilized knee with a size 8 femur size 7 tibia size 12 polyethylene insert size 41 patella ? ? ?SURGEON:  Surgeon(s) and Role: ?   Carole Civil, MD - Primary ? ?PHYSICIAN ASSISTANT:  ? ?ASSISTANTS: cynthia wrenn  ? ?ANESTHESIA:   spinal and saphenos nerve block  ? ?EBL:  20 mL  ? ?BLOOD ADMINISTERED:none ? ?DRAINS: none  ? ?LOCAL MEDICATIONS USED:  OTHER zynrelef ? ? ?SPECIMEN:  No Specimen ? ?DISPOSITION OF SPECIMEN:  N/A ? ?COUNTS:  YES ? ?TOURNIQUET:   ?Total Tourniquet Time Documented: ?Thigh (Left) - 108 minutes ?Total: Thigh (Left) - 108 minutes ? ? ?DICTATION: .Dragon Dictation ? ?PLAN OF CARE: Admit for overnight observation ? ?PATIENT DISPOSITION:  PACU - hemodynamically stable. ?  ?Delay start of Pharmacological VTE agent (>24hrs) due to surgical blood loss or risk of bleeding: yes ? ?

## 2021-05-25 NOTE — Evaluation (Signed)
Physical Therapy Evaluation ?Patient Details ?Name: Luke Chavez ?MRN: [id number] ?DOB: 1966/01/21 ?Today's Date: 05/25/2021 ? ? ?LEFT KNEE ROM: 0 - 100 degrees ?AMBULATION DISTANCE: 120 feet using RW with Mod Independent/Supervision ? ? ?History of Present Illness ? ASHLEY MONTMINY is a 56 y/o male, s/p Left THA on 05/25/21, with the diagnosis of Primary osteoarthritis left knee.  ?Clinical Impression ? Patient instructed in and given HEP with good return demonstrated and understanding acknowledged.  Patient able to achieve 0-100 degrees self flexing left knee while seated at bedside, fair/good return for left heel to toe stepping during gait training without loss of balance and tolerated sitting up in chair with LLE dangling after therapy.  Patient will benefit from continued skilled physical therapy in hospital and recommended venue below to increase strength, balance, endurance for safe ADLs and gait.    ?   ? ?Recommendations for follow up therapy are one component of a multi-disciplinary discharge planning process, led by the attending physician.  Recommendations may be updated based on patient status, additional functional criteria and insurance authorization. ? ?Follow Up Recommendations Home health PT ? ?  ?Assistance Recommended at Discharge PRN  ?Patient can return home with the following ? A little help with walking and/or transfers;A little help with bathing/dressing/bathroom;Help with stairs or ramp for entrance;Assistance with cooking/housework ? ?  ?Equipment Recommendations BSC/3in1  ?Recommendations for Other Services ?    ?  ?Functional Status Assessment Patient has had a recent decline in their functional status and demonstrates the ability to make significant improvements in function in a reasonable and predictable amount of time.  ? ?  ?Precautions / Restrictions Precautions ?Precautions: Fall ?Restrictions ?Weight Bearing Restrictions: Yes ?LLE Weight Bearing: Weight bearing as tolerated  ? ?   ? ?Mobility ? Bed Mobility ?Overal bed mobility: Modified Independent ?  ?  ?  ?  ?  ?  ?General bed mobility comments: good return for moving LLE during supine to sitting ?  ? ?Transfers ?Overall transfer level: Needs assistance ?Equipment used: Rolling walker (2 wheels) ?Transfers: Sit to/from Stand, Bed to chair/wheelchair/BSC ?Sit to Stand: Modified independent (Device/Increase time), Supervision ?  ?Step pivot transfers: Modified independent (Device/Increase time), Supervision ?  ?  ?  ?General transfer comment: required verbal cues for proper hand placement during sit to stands with good carryover demonstrated ?  ? ?Ambulation/Gait ?Ambulation/Gait assistance: Supervision, Modified independent (Device/Increase time) ?Gait Distance (Feet): 120 Feet ?Assistive device: Rolling walker (2 wheels) ?Gait Pattern/deviations: Decreased step length - right, Decreased step length - left, Decreased stance time - left, Antalgic ?Gait velocity: decreased ?  ?  ?General Gait Details: slightly labored cadence with fair/good return for left heel to toe stepping without loss of balance ? ?Stairs ?  ?  ?  ?  ?  ? ?Wheelchair Mobility ?  ? ?Modified Rankin (Stroke Patients Only) ?  ? ?  ? ?Balance Overall balance assessment: Needs assistance ?Sitting-balance support: Feet supported, No upper extremity supported ?Sitting balance-Leahy Scale: Good ?Sitting balance - Comments: seated at EOB ?  ?Standing balance support: During functional activity, Bilateral upper extremity supported ?Standing balance-Leahy Scale: Fair ?Standing balance comment: fair/good using RW ?  ?  ?  ?  ?  ?  ?  ?  ?  ?  ?  ?   ? ? ? ?Pertinent Vitals/Pain Pain Assessment ?Pain Assessment: 0-10 ?Pain Score: 6  ?Pain Location: left knee ?Pain Descriptors / Indicators: Sore ?Pain Intervention(s): Limited activity within patient's tolerance,  Monitored during session, Repositioned  ? ? ?Home Living Family/patient expects to be discharged to:: Private  residence ?Living Arrangements: Spouse/significant other ?Available Help at Discharge: Family;Available 24 hours/day ?Type of Home: House ?Home Access: Stairs to enter ?Entrance Stairs-Rails: Right ?Entrance Stairs-Number of Steps: 5 ?  ?Home Layout: One level ?Home Equipment: Conservation officer, nature (2 wheels) ?   ?  ?Prior Function Prior Level of Function : Independent/Modified Independent ?  ?  ?  ?  ?  ?  ?Mobility Comments: Hydrographic surveyor, drives ?ADLs Comments: Independent ?  ? ? ?Hand Dominance  ? Dominant Hand: Right ? ?  ?Extremity/Trunk Assessment  ? Upper Extremity Assessment ?Upper Extremity Assessment: Overall WFL for tasks assessed ?  ? ?Lower Extremity Assessment ?Lower Extremity Assessment: Overall WFL for tasks assessed;LLE deficits/detail ?LLE Deficits / Details: grossly 4/5 ?LLE: Unable to fully assess due to pain ?LLE Sensation: WNL ?LLE Coordination: WNL ?  ? ?Cervical / Trunk Assessment ?Cervical / Trunk Assessment: Normal  ?Communication  ? Communication: No difficulties  ?Cognition Arousal/Alertness: Awake/alert ?Behavior During Therapy: [facility] for tasks assessed/performed ?Overall Cognitive Status: Within Functional Limits for tasks assessed ?  ?  ?  ?  ?  ?  ?  ?  ?  ?  ?  ?  ?  ?  ?  ?  ?  ?  ?  ? ?  ?General Comments   ? ?  ?Exercises Total Joint Exercises ?Ankle Circles/Pumps: Supine, 10 reps, Left, Strengthening, AROM ?Quad Sets: AROM, Strengthening, Left, 10 reps, Supine ?Short Arc Quad: Supine, 10 reps, Left, Strengthening, AROM ?Heel Slides: AROM, Strengthening, Left, 10 reps, Supine ?Goniometric ROM: left knee: 0 - 100 degrees  ? ?Assessment/Plan  ?  ?PT Assessment Patient needs continued PT services  ?PT Problem List Decreased strength;Decreased range of motion;Decreased activity tolerance;Decreased mobility ? ?   ?  ?PT Treatment Interventions DME instruction;Gait training;Stair training;Functional mobility training;Therapeutic activities;Therapeutic exercise;Patient/family  education;Balance training   ? ?PT Goals (Current goals can be found in the Care Plan section)  ?Acute Rehab PT Goals ?Patient Stated Goal: return home with family to asssit ?PT Goal Formulation: With patient ?Time For Goal Achievement: 05/27/21 ?Potential to Achieve Goals: Good ? ?  ?Frequency BID ?  ? ? ?Co-evaluation   ?  ?  ?  ?  ? ? ?  ?AM-PAC PT "6 Clicks" Mobility  ?Outcome Measure Help needed turning from your back to your side while in a flat bed without using bedrails?: None ?Help needed moving from lying on your back to sitting on the side of a flat bed without using bedrails?: None ?Help needed moving to and from a bed to a chair (including a wheelchair)?: A Little ?Help needed standing up from a chair using your arms (e.g., wheelchair or bedside chair)?: A Little ?Help needed to walk in hospital room?: A Little ?Help needed climbing 3-5 steps with a railing? : A Little ?6 Click Score: 20 ? ?  ?End of Session   ?Activity Tolerance: Patient tolerated treatment well;Patient limited by fatigue ?Patient left: in chair;with call bell/phone within reach ?Nurse Communication: Mobility status ?PT Visit Diagnosis: Unsteadiness on feet (R26.81);Other abnormalities of gait and mobility (R26.89);Muscle weakness (generalized) (M62.81) ?  ? ?Time: 7106-2694 ?PT Time Calculation (min) (ACUTE ONLY): 31 min ? ? ?Charges:   PT Evaluation ?$PT Eval Moderate Complexity: 1 Mod ?PT Treatments ?$Therapeutic Activity: 23-37 mins ?  ?   ? ? ?3:35 PM, 05/25/21 ?Lonell Grandchild, MPT ?Physical Therapist with [place] ?[facility]  [facility] ?[phone] office ?[phone] mobile phone ? ? ?

## 2021-05-25 NOTE — Plan of Care (Signed)
?  Problem: Acute Rehab PT Goals(only PT should resolve) ?Goal: Pt Will Go Supine/Side To Sit ?Outcome: Progressing ?Flowsheets (Taken 05/25/2021 1535) ?Pt will go Supine/Side to Sit: ? Independently ? with modified independence ?Goal: Patient Will Transfer Sit To/From Stand ?Outcome: Progressing ?Flowsheets (Taken 05/25/2021 1535) ?Patient will transfer sit to/from stand: with modified independence ?Goal: Pt Will Transfer Bed To Chair/Chair To Bed ?Outcome: Progressing ?Flowsheets (Taken 05/25/2021 1535) ?Pt will Transfer Bed to Chair/Chair to Bed: with modified independence ?Goal: Pt Will Ambulate ?Outcome: Progressing ?Flowsheets (Taken 05/25/2021 1535) ?Pt will Ambulate: ? > 125 feet ? with modified independence ? with rolling walker ?  ?3:36 PM, 05/25/21 ?Lonell Grandchild, MPT ?Physical Therapist with [place] ?[facility] ?[phone] office ?[phone] mobile phone ? ?

## 2021-05-25 NOTE — Op Note (Signed)
Dictation for total knee replacement ? ?05/25/2021 ? ?10:30 AM ? ?Preop diagnosis osteoarthritis LEFT KNEE ? ?Postop diagnosis osteoarthritis LEFT KNEE ? ?Procedure LEFTtotal knee arthroplasty ? ?Implants  ? ?DePuy   Attune, fixed-bearing posterior stabilized total knee: Sizes: Femur   8   tibia 7   patella 41   POLYthylene 12 ? ?Bone cuts:  ?Distal femur 9 ? ?PROXIMAL TIBIA 9 ? ?PATELLA 12      ? ?Findings at surgery ?Medial compartment grade 4 with tibia and femur, medial meniscus still intact ? ?ACL PCL intact ? ?Lateral compartment normal lateral meniscus normal lateral femoral condyle normal lateral tibial plateau ? ?Patellofemoral joint grade 2 arthritis on the patella and 3 arthritis on the trochlea ? ?Osteophytes were around the entire joint ? ?Patient has thickened tissue thickened synovium thickened posterior fat pad behind the patell patella and patellar tendon ? ?ASSISTANTS: [employer] ? ?ANESTHESIA:   Preop saphenous nerve block plus spinal ? ?BLOOD ADMINISTERED:none ? ?DRAINS: none  ? ?LOCAL MEDICATIONS USED:  EXPAREL  ?SPECIMEN:  No Specimen ? ?DISPOSITION OF SPECIMEN:  N/A ? ?COUNTS:  YES ? ?TOURNIQUET: 275 mm 108 minutes ? ?DICTATION: .Dragon Dictation  ? ?The patient was taken to the recovery room in stable condition ? ?PLAN OF CARE: Normal postop care ? ?PATIENT DISPOSITION:  PACU - hemodynamically stable. ?  ?Delay start of Pharmacological VTE agent (>24hrs) due to surgical blood loss or risk of bleeding: not applicable ? ?Details of surgery: ?The patient was identified by 2 approved identification mechanisms. The operative extremity was evaluated and found to be acceptable for surgical treatment today. The chart was reviewed. The surgical site was confirmed and marked. ?The patient had a preop saphenous nerve block  ? ?The patient was taken to the operating room and given appropriate antibiotic 3 g Ancef. This is consistent with the SCIP protocol. ? ?The patient was given the  following anesthetic: Spinal anesthesia ? ?The patient was then placed supine on the operating table. A Foley catheter was inserted. The operative extremity was prepped and draped sterilely from the toes to the groin. ? ?Timeout was executed confirming the patient's name, surgical site, antibiotic administration, x-rays available, and implants available. ? ?The operative limb,  was exsanguinated with a six-inch Esmarch and the tourniquet was inflated to 275 mmHg. ? ?A straight midline incision was made over the left KNEE and taken down to the extensor mechanism. A medial arthrotomy was performed. The patella was everted and the patellofemoral soft tissue was released, along with the patellar fat pad.  The anterior cruciate ligament and PCL were resected. ? ?The anterior horns of the lateral and medial meniscus were resected. The medial soft tissue sleeve was elevated to the mid coronal plane. ? ?Thickened synovial tissue and posterior fat pad scarring was excised with sharp dissection ? ?A three-eighths inch drill bit was used to enter the femoral canal which was decompressed with suction and irrigation until clear.  ? ?The distal femoral cutting guide was set for 9 mm distal resection,  5?valgus alignment, for a left knee. The distal femur was resected and checked for flatness. ? ?The Depuy Sigma sizing femoral guide was placed and the femur was sized to a size 8.  ? ?The 4 femoral cuts were made with a femoral cutting block size 8 ? ?The external alignment guide for the tibial resection was then applied to the distal and proximal tibia and set for 3 degree slope along with 9 MM resection  from the higher lateral side.  ? ?Rotational alignment was set using the malleolus, the tibial tubercle and the tibial spines. ? ?The proximal tibia was resected along with  residual menisci. The tibia was sized using a base plate to a size 7.  ? ?Spacer blocks were used to confirm equal flexion extension gaps no releases were  needed A size 12 MM spacer block gave equal stability and flexion extension. ? ?The correct sized notch cutting guide for the femur was then applied and the notch cut was made. ? ?Trial reduction was completed using size 8 femur 7 tibia 12 polyethylene and 10 polyethylene trial implants. Patella tracking was normal ? ?We then skeletonized the patella. It measured 27 in thickness and the patellar resection was set for 15 millimeters. the patellar resection was completed. The patella diameter measured 41. We then drilled the peg holes for the patella.  Completed patellar thickness was 15 mm +11 is 26 mm ? ?The proximal tibia was prepared using the size 7 base plate. ? ?Thorough irrigation was performed and the bone was dried and prepared for cement. The cement was mixed on the back table using third generation preparation techniques ? ? ?The implants were then cemented in place and excess cement was removed. The cement was allowed to cure. Irrigation was repeated and excess and residual bone fragments and cement were removed. ? ? ?ZYNRELEF was injected ? ?The extensor mechanism was closed with #1 Bralon suture followed by subcutaneous tissue closure using 0 Monocryl suture ? ? ?Skin approximation was performed using staples ? ?A sterile dressing was applied, Aquacel dressing.  TED hose.  Cryo/Cuff. ? ?The patient was taken recovery room in stable condition ? ?  ?

## 2021-05-25 NOTE — Anesthesia Postprocedure Evaluation (Signed)
Anesthesia Post Note ? ?Patient: Martell Mcfadyen Facchini ? ?Procedure(s) Performed: TOTAL KNEE ARTHROPLASTY (Left: Knee) ? ?Patient location during evaluation: PACU ?Anesthesia Type: Combined General/Spinal ?Level of consciousness: awake and alert and oriented ?Pain management: pain level controlled ?Vital Signs Assessment: post-procedure vital signs reviewed and stable ?Respiratory status: spontaneous breathing, nonlabored ventilation and respiratory function stable ?Cardiovascular status: blood pressure returned to baseline and stable ?Postop Assessment: no apparent nausea or vomiting ?Anesthetic complications: no ? ? ?No notable events documented. ? ? ?Last Vitals:  ?Vitals:  ? 05/25/21 1200 05/25/21 1219  ?BP: (!) 124/96 (!) 138/92  ?Pulse: (!) 59 65  ?Resp: 16 18  ?Temp: (!) 36.4 ?C (!) 36.4 ?C  ?SpO2: 97% 98%  ?  ?Last Pain:  ?Vitals:  ? 05/25/21 1219  ?TempSrc: Oral  ?PainSc:   ? ? ?  ?  ?  ?  ?  ?  ? ?Naijah Lacek C Maryjo Ragon ? ? ? ? ?

## 2021-05-25 NOTE — Anesthesia Procedure Notes (Signed)
Anesthesia Regional Block: Adductor canal block  ? ?Pre-Anesthetic Checklist: , timeout performed,  Correct Patient, Correct Site, Correct Laterality,  Correct Procedure, Correct Position, site marked,  Risks and benefits discussed,  At surgeon's request and post-op pain management ? ?Laterality: Left and Lower ? ?Prep: chloraprep     ?  ?Needles:  ?Injection technique: Single-shot ? ?Needle Type: Echogenic Stimulator Needle   ? ? ?Needle Length: 10cm  ?Needle Gauge: 20  ? ?Needle insertion depth: 7 cm ? ? ?Additional Needles: ? ? ?Procedures:, nerve stimulator,,, ultrasound used (permanent image in chart),,    ? ?Nerve Stimulator or Paresthesia:  ?Response: Twitch elicited, 0.6 mA, 7 cm ?Response: no twitch at 0.5 ? ?Additional Responses:  ? ?Narrative:  ?Start time: 05/25/2021 7:13 AM ?End time: 05/25/2021 7:23 AM ?Injection made incrementally with aspirations every 5 mL. ? ?Performed by: Personally  ?Anesthesiologist: Denese Killings, MD ? ?Additional Notes: ?BP cuff, EKG monitors applied. Sedation begun. After nerve location anesthetic injected incrementally, slowly , and after neg aspirations. Tolerated well. ? ? ? ?

## 2021-05-25 NOTE — Interval H&P Note (Signed)
History and Physical Interval Note: ? ?05/25/2021 ?7:21 AM ? ?Luke Chavez  has presented today for surgery, with the diagnosis of Primary osteoarthritis left knee.  The various methods of treatment have been discussed with the patient and family. After consideration of risks, benefits and other options for treatment, the patient has consented to  Procedure(s): ?TOTAL KNEE ARTHROPLASTY (Left) as a surgical intervention.  The patient's history has been reviewed, patient examined, no change in status, stable for surgery.  I have reviewed the patient's chart and labs.  Questions were answered to the patient's satisfaction.   ? ? ?Arther Abbott ? ? ?

## 2021-05-25 NOTE — H&P (Signed)
?Chief Complaint  ?Patient presents with  ? Knee Pain  ?    Bilateral left worse than right  ?  ?  ?    ?Encounter Diagnoses  ?Name Primary?  ? Bilateral chronic knee pain Yes  ? Primary osteoarthritis of both knees    ?  ?56 year old male with left knee pain has been to see [facility] Dr. Alvan Dame for second opinion regarding his arthritis ? ?He is says he is come to the end of the road in terms of dealing with his arthritic knee pain he has had meloxicam injections including hyaluronic acid and has had an exercise program as well he has hypertension he takes 2 medications ? ?He complains of severe disabling left knee pain and wishes to proceed with left total knee ? ?I was able to get the notes from Dr. Paralee Cancel dated September 01, 2020 ? ?Dr. Alvan Dame  recommended knee replacements ? ?  ?    ?Past Medical History:  ?Diagnosis Date  ? Arthritis    ? Chronic back pain    ? Hypertension    ?  ?  ?  ?Current Outpatient Medications:  ?  hydrochlorothiazide (HYDRODIURIL) 25 MG tablet, Take 25 mg by mouth daily., Disp: , Rfl:  ?  losartan (COZAAR) 100 MG tablet, Take 100 mg by mouth daily., Disp: , Rfl:  ?  meloxicam (MOBIC) 15 MG tablet, Take 15 mg by mouth daily., Disp: , Rfl:  ?Social History  ?  ?     ?Tobacco Use  ? Smoking status: Former  ?    Types: Cigarettes  ?    Quit date: 04/22/2009  ?    Years since quitting: 12.0  ? Smokeless tobacco: Never  ?Substance Use Topics  ? Alcohol use: No  ? Drug use: Yes  ?    Types: Cocaine, Marijuana  ?    Comment: former  ?  ?     ?Past Surgical History:  ?Procedure Laterality Date  ? COLONOSCOPY N/A 04/17/2015  ?  Procedure: COLONOSCOPY;  Surgeon: Danie Binder, MD;  Location: [facility];  Service: Endoscopy;  Laterality: N/A;  1:00 PM  ? KNEE ARTHROSCOPY WITH MEDIAL MENISECTOMY Left 04/24/2017  ?  Procedure: KNEE ARTHROSCOPY WITH MEDIAL MENISECTOMY;  Surgeon: Carole Civil, MD;  Location: [facility];  Service: Orthopedics;  Laterality: Left;  ?  ?     ?Family History   ?Problem Relation Age of Onset  ? Cancer Father    ? Arthritis Father    ? Arthritis Maternal Aunt    ? Arthritis Maternal Uncle    ? Arthritis Paternal Aunt    ?  ?  ?BP (!) 142/102   Pulse 87   Ht '6\' 2"'$  (1.88 m)   Wt 272 lb (123.4 kg)   BMI 34.92 kg/m?  ?  ?General well-developed well-nourished grooming hygiene normal no gross deformities ? ?Cardiovascular exam no peripheral edema normal extremities in terms of temperature and color ?  ?Mood and affect normal ?  ?Alert awake and oriented x3 ?  ?Left knee skin envelope looks normal ?Tenderness medial joint with effusion ?No flexion contracture, flexion 120 degrees ?Quad strength normal ?Knee stable in all planes ?  ?Neurologic normal exam ?  ?X-ray shows varus alignment medial compartment disease advanced arthritis with secondary bone changes ?  ?    ?Encounter Diagnoses  ?Name Primary?  ? Bilateral chronic knee pain Yes  ? Primary osteoarthritis of both knees    ?  ?  ?  The procedure has been fully reviewed with the patient; The risks and benefits of surgery have been discussed and explained and understood. Alternative treatment has also been reviewed, questions were encouraged and answered. The postoperative plan is also been reviewed. ?  ?Left total knee arthroplasty March 24 ?

## 2021-05-25 NOTE — Anesthesia Preprocedure Evaluation (Addendum)
Anesthesia Evaluation  ?Patient identified by MRN, date of birth, ID band ?Patient awake ? ? ? ?Reviewed: ?Allergy & Precautions, NPO status , Patient's Chart, lab work & pertinent test results ? ?Airway ?Mallampati: II ? ?TM Distance: >3 FB ?Neck ROM: Full ? ? ? Dental ? ?(+) Edentulous Upper, Edentulous Lower ?  ?Pulmonary ?former smoker,  ?  ?Pulmonary exam normal ?breath sounds clear to auscultation ? ? ? ? ? ? Cardiovascular ?Exercise Tolerance: Good ?hypertension, Pt. on medications ?Normal cardiovascular exam ?Rhythm:Regular Rate:Normal ? ? ?  ?Neuro/Psych ?negative neurological ROS ? negative psych ROS  ? GI/Hepatic ?negative GI ROS, Neg liver ROS,   ?Endo/Other  ?negative endocrine ROS ? Renal/GU ?negative Renal ROS  ?negative genitourinary ?  ?Musculoskeletal ? ?(+) Arthritis , Osteoarthritis,   ? Abdominal ?  ?Peds ?negative pediatric ROS ?(+)  Hematology ?negative hematology ROS ?(+)   ?Anesthesia Other Findings ? ? Reproductive/Obstetrics ?negative OB ROS ? ?  ? ? ? ? ? ? ? ? ? ? ? ? ? ?  ?  ? ? ? ? ? ? ? ?Anesthesia Physical ?Anesthesia Plan ? ?ASA: 2 ? ?Anesthesia Plan: General/Spinal  ? ?Post-op Pain Management: Minimal or no pain anticipated, Dilaudid IV and Regional block*  ? ?Induction: Intravenous ? ?PONV Risk Score and Plan: 3 and Ondansetron, Dexamethasone and Midazolam ? ?Airway Management Planned: Nasal Cannula, Natural Airway and Simple Face Mask ? ?Additional Equipment:  ? ?Intra-op Plan:  ? ?Post-operative Plan:  ? ?Informed Consent: I have reviewed the patients History and Physical, chart, labs and discussed the procedure including the risks, benefits and alternatives for the proposed anesthesia with the patient or authorized representative who has indicated his/her understanding and acceptance.  ? ? ? ?Dental advisory given ? ?Plan Discussed with: CRNA and Surgeon ? ?Anesthesia Plan Comments:   ? ? ? ? ? ?Anesthesia Quick Evaluation ? ?

## 2021-05-25 NOTE — Anesthesia Procedure Notes (Signed)
Spinal ? ?Start time: 05/25/2021 7:40 AM ?End time: 05/25/2021 7:50 AM ?Reason for block: surgical anesthesia ?Staffing ?Performed: resident/CRNA  ?Resident/CRNA: Myna Bright, CRNA ?Preanesthetic Checklist ?Completed: patient identified, IV checked, site marked, risks and benefits discussed, surgical consent, monitors and equipment checked, pre-op evaluation and timeout performed ?Spinal Block ?Patient position: sitting ?Prep: ChloraPrep ?Patient monitoring: heart rate, cardiac monitor, continuous pulse ox and blood pressure ?Approach: midline ?Location: L3-4 ?Injection technique: single-shot ?Needle ?Needle type: Sprotte  ?Needle gauge: 22 G ?Needle length: 9 cm ?Needle insertion depth: 8 cm ?Assessment ?Sensory level: T10 ?Events: CSF return ?Additional Notes ?First attempt at L4-5 with 24 G Pencan. CSF obtained with needle inserted at full depth, but could not obtain swirl in syringe. Attempts at repositioning unsuccessful. Second attempt at L3-4 with 22 G Sprotte successful.  ? ? ? ?

## 2021-05-25 NOTE — Plan of Care (Signed)

## 2021-05-25 NOTE — Transfer of Care (Signed)
Immediate Anesthesia Transfer of Care Note ? ?Patient: Luke Chavez ? ?Procedure(s) Performed: TOTAL KNEE ARTHROPLASTY (Left: Knee) ? ?Patient Location: PACU ? ?Anesthesia Type:Spinal and MAC combined with regional for post-op pain ? ?Level of Consciousness: awake, alert , oriented and patient cooperative ? ?Airway & Oxygen Therapy: Patient Spontanous Breathing ? ?Post-op Assessment: Report given to RN, Post -op Vital signs reviewed and stable and Patient moving all extremities ? ?Post vital signs: Reviewed and stable ? ?Last Vitals:  ?Vitals Value Taken Time  ?BP 114/82 05/25/21 1015  ?Temp    ?Pulse 73 05/25/21 1018  ?Resp 18 05/25/21 1018  ?SpO2 95 % 05/25/21 1018  ?Vitals shown include unvalidated device data. ? ?Last Pain:  ?Vitals:  ? 05/25/21 0644  ?TempSrc: Oral  ?PainSc: 0-No pain  ?   ? ?Patients Stated Pain Goal: 7 (05/25/21 8184) ? ?Complications: No notable events documented. ?

## 2021-05-26 DIAGNOSIS — Z79899 Other long term (current) drug therapy: Secondary | ICD-10-CM | POA: Diagnosis not present

## 2021-05-26 DIAGNOSIS — Z96652 Presence of left artificial knee joint: Secondary | ICD-10-CM | POA: Diagnosis not present

## 2021-05-26 DIAGNOSIS — I10 Essential (primary) hypertension: Secondary | ICD-10-CM | POA: Diagnosis not present

## 2021-05-26 DIAGNOSIS — M1712 Unilateral primary osteoarthritis, left knee: Secondary | ICD-10-CM | POA: Diagnosis not present

## 2021-05-26 DIAGNOSIS — Z87891 Personal history of nicotine dependence: Secondary | ICD-10-CM | POA: Diagnosis not present

## 2021-05-26 LAB — BASIC METABOLIC PANEL
Anion gap: 7 (ref 5–15)
BUN: 17 mg/dL (ref 6–20)
CO2: 27 mmol/L (ref 22–32)
Calcium: 9.3 mg/dL (ref 8.9–10.3)
Chloride: 103 mmol/L (ref 98–111)
Creatinine, Ser: 0.74 mg/dL (ref 0.61–1.24)
GFR, Estimated: >60 mL/min (ref >60)
Glucose, Bld: 134 mg/dL — ABNORMAL HIGH (ref 70–99)
Potassium: 4 mmol/L (ref 3.5–5.1)
Sodium: 137 mmol/L (ref 135–145)

## 2021-05-26 LAB — CBC
HCT: 38.9 % — ABNORMAL LOW (ref 39.0–52.0)
Hemoglobin: 12.9 g/dL — ABNORMAL LOW (ref 13.0–17.0)
MCH: 29 pg (ref 26.0–34.0)
MCHC: 33.2 g/dL (ref 30.0–36.0)
MCV: 87.4 fL (ref 80.0–100.0)
Platelets: 203 10*3/uL (ref 150–400)
RBC: 4.45 MIL/uL (ref 4.22–5.81)
RDW: 12.6 % (ref 11.5–15.5)
WBC: 12.5 10*3/uL — ABNORMAL HIGH (ref 4.0–10.5)
nRBC: 0 % (ref 0.0–0.2)

## 2021-05-26 MED ORDER — TRAMADOL HCL 50 MG PO TABS: 50.0000 mg | ORAL_TABLET | Freq: Four times a day (QID) | ORAL | 0 refills | Status: AC

## 2021-05-26 MED ORDER — METHOCARBAMOL 500 MG PO TABS: 500.0000 mg | ORAL_TABLET | Freq: Four times a day (QID) | ORAL | 1 refills | Status: DC | PRN

## 2021-05-26 MED ORDER — DOCUSATE SODIUM 100 MG PO CAPS: 100.0000 mg | ORAL_CAPSULE | Freq: Two times a day (BID) | ORAL | 0 refills | Status: DC

## 2021-05-26 MED ORDER — POLYETHYLENE GLYCOL 3350 17 G PO PACK: 17.0000 g | PACK | Freq: Every day | ORAL | 0 refills | Status: DC

## 2021-05-26 MED ORDER — HYDROCODONE-ACETAMINOPHEN 10-325 MG PO TABS: 1.0000 | ORAL_TABLET | ORAL | 0 refills | Status: AC | PRN

## 2021-05-26 MED ORDER — ASPIRIN 325 MG PO TBEC: 325.0000 mg | DELAYED_RELEASE_TABLET | Freq: Every day | ORAL | 0 refills | Status: DC

## 2021-05-26 NOTE — Progress Notes (Signed)
Nsg Discharge Note ? ?Admit Date:  05/25/2021 ?Discharge date: 05/26/2021 ?  ?Luke Chavez to be D/C'd Home per MD order.  AVS completed.  ?Patient/caregiver able to verbalize understanding. ? ?Discharge Medication: ?Allergies as of 05/26/2021   ? ?   Reactions  ? Ketorolac Tromethamine Rash  ? ?  ? ?  ?Medication List  ?  ? ?STOP taking these medications   ? ?acetaminophen 500 MG tablet ?Commonly known as: TYLENOL ?  ?meloxicam 15 MG tablet ?Commonly known as: MOBIC ?  ? ?  ? ?TAKE these medications   ? ?aspirin 325 MG EC tablet ?Take 1 tablet (325 mg total) by mouth daily with breakfast. ?Start taking on: May 27, 2021 ?  ?docusate sodium 100 MG capsule ?Commonly known as: COLACE ?Take 1 capsule (100 mg total) by mouth 2 (two) times daily. ?  ?hydrochlorothiazide 25 MG tablet ?Commonly known as: HYDRODIURIL ?Take 25 mg by mouth daily. ?  ?HYDROcodone-acetaminophen 10-325 MG tablet ?Commonly known as: Norco ?Take 1 tablet by mouth every 4 (four) hours as needed for up to 7 days. ?  ?losartan 100 MG tablet ?Commonly known as: COZAAR ?Take 100 mg by mouth daily. ?  ?methocarbamol 500 MG tablet ?Commonly known as: ROBAXIN ?Take 1 tablet (500 mg total) by mouth every 6 (six) hours as needed for muscle spasms. ?  ?polyethylene glycol 17 g packet ?Commonly known as: MIRALAX / GLYCOLAX ?Take 17 g by mouth daily. ?Start taking on: May 27, 2021 ?  ?traMADol 50 MG tablet ?Commonly known as: ULTRAM ?Take 1 tablet (50 mg total) by mouth every 6 (six) hours for 7 days. ?  ?VITAMIN D3 PO ?Take 1 tablet by mouth every 14 (fourteen) days. ?  ? ?  ? ? ?Discharge Assessment: ?Vitals:  ? 05/26/21 0553 05/26/21 1000  ?BP: 132/81 128/80  ?Pulse: 73 88  ?Resp: 18   ?Temp: 97.9 ?F (36.6 ?C)   ?SpO2: 99%   ? Skin clean, dry and intact without evidence of skin break down, no evidence of skin tears noted. ?IV catheter discontinued intact. Site without signs and symptoms of complications - no redness or edema noted at insertion site,  patient denies c/o pain - only slight tenderness at site.  Dressing with slight pressure applied. ? ?D/c Instructions-Education: ?Discharge instructions given to patient/family with verbalized understanding. ?D/c education completed with patient/family including follow up instructions, medication list, d/c activities limitations if indicated, with other d/c instructions as indicated by MD - patient able to verbalize understanding, all questions fully answered. ?Patient instructed to return to ED, call 911, or call MD for any changes in condition.  ?Patient escorted via [place], and D/C home via private auto. ? ?Kathie Rhodes, RN ?05/26/2021 12:22 PM  ?

## 2021-05-26 NOTE — Progress Notes (Signed)
Physical Therapy Treatment ?Patient Details ?Name: Luke Chavez ?MRN: [id number] ?DOB: 1965-08-25 ?Today's Date: 05/26/2021 ? ?LEFT KNEE ROM: 0 - 101 degrees ?AMBULATION DISTANCE: 150 feet using RW with Mod Independent ? ?History of Present Illness Luke Chavez is a 56 y/o male, s/p Left THA on 05/25/21, with the diagnosis of Primary osteoarthritis left knee. ? ?  ?PT Comments  ? ? Patient demonstrates good return for going up/down steps in stair well using both hands on 1 side rail without loss of balance and understanding acknowledged for position of helper.  Patient demonstrates fair/good return for left heel to toe stepping and increased endurance/distance for gait training without loss of balance and limited mostly due to fatigue with pain at baseline 3/10.  Patient requested to go back to bed after therapy.  Patient will benefit from continued skilled physical therapy in hospital and recommended venue below to increase strength, balance, endurance for safe ADLs and gait.  ?   ?Recommendations for follow up therapy are one component of a multi-disciplinary discharge planning process, led by the attending physician.  Recommendations may be updated based on patient status, additional functional criteria and insurance authorization. ? ?Follow Up Recommendations ? Home health PT ?  ?  ?Assistance Recommended at Discharge PRN  ?Patient can return home with the following A little help with walking and/or transfers;A little help with bathing/dressing/bathroom;Help with stairs or ramp for entrance;Assistance with cooking/housework ?  ?Equipment Recommendations ? BSC/3in1  ?  ?Recommendations for Other Services   ? ? ?  ?Precautions / Restrictions Precautions ?Precautions: Fall ?Restrictions ?Weight Bearing Restrictions: Yes ?LLE Weight Bearing: Weight bearing as tolerated  ?  ? ?Mobility ? Bed Mobility ?Overal bed mobility: Modified Independent ?  ?  ?  ?  ?  ?  ?General bed mobility comments: good return for  propping up on elbows to hands without having to use bed rails ?  ? ?Transfers ?Overall transfer level: Modified independent ?Equipment used: Rolling walker (2 wheels) ?Transfers: Sit to/from Stand, Bed to chair/wheelchair/BSC ?Sit to Stand: Modified independent (Device/Increase time) ?Stand pivot transfers: Modified independent (Device/Increase time) ?  ?  ?  ?  ?General transfer comment: good return for proper hand placement during sit to stands and transfers without requiring verbal cues ?  ? ?Ambulation/Gait ?Ambulation/Gait assistance: Modified independent (Device/Increase time) ?Gait Distance (Feet): 150 Feet ?Assistive device: Rolling walker (2 wheels) ?Gait Pattern/deviations: Decreased step length - right, Decreased step length - left, Decreased stride length ?Gait velocity: decreased ?  ?  ?General Gait Details: increased endurance/distance for ambulation with slightly labored cadence and fair/good return for left heel to toe stepping without loss of balance ? ? ?Stairs ?Stairs: Yes ?Stairs assistance: Modified independent (Device/Increase time), Supervision ?Stair Management: One rail Right, Step to pattern ?Number of Stairs: 5 ?General stair comments: demonstrates good return for going up/down steps in stair well using both hands on 1 side rail with step to pattern without loss of balance ? ? ?Wheelchair Mobility ?  ? ?Modified Rankin (Stroke Patients Only) ?  ? ? ?  ?Balance Overall balance assessment: Needs assistance ?Sitting-balance support: Feet supported, No upper extremity supported ?Sitting balance-Leahy Scale: Good ?Sitting balance - Comments: seated at EOB ?  ?Standing balance support: During functional activity, Bilateral upper extremity supported ?Standing balance-Leahy Scale: Fair ?Standing balance comment: fair/good using RW ?  ?  ?  ?  ?  ?  ?  ?  ?  ?  ?  ?  ? ?  ?  Cognition Arousal/Alertness: Awake/alert ?Behavior During Therapy: [facility] for tasks assessed/performed ?Overall Cognitive Status:  Within Functional Limits for tasks assessed ?  ?  ?  ?  ?  ?  ?  ?  ?  ?  ?  ?  ?  ?  ?  ?  ?  ?  ?  ? ?  ?Exercises Total Joint Exercises ?Ankle Circles/Pumps: Supine, 10 reps, Left, Strengthening, AROM ?Quad Sets: AROM, Strengthening, Left, 10 reps, Supine ?Short Arc Quad: Supine, 10 reps, Left, Strengthening, AROM ?Heel Slides: AROM, Strengthening, Left, 10 reps, Supine ?Goniometric ROM: left knee: 0 - 101 degrees ? ?  ?General Comments   ?  ?  ? ?Pertinent Vitals/Pain Pain Assessment ?Pain Assessment: 0-10 ?Pain Score: 3  ?Pain Location: left knee ?Pain Descriptors / Indicators: Sore ?Pain Intervention(s): Limited activity within patient's tolerance, Monitored during session, Repositioned  ? ? ?Home Living   ?  ?  ?  ?  ?  ?  ?  ?  ?  ?   ?  ?Prior Function    ?  ?  ?   ? ?PT Goals (current goals can now be found in the care plan section) Acute Rehab PT Goals ?Patient Stated Goal: return home with family to asssit ?PT Goal Formulation: With patient ?Time For Goal Achievement: 05/27/21 ?Potential to Achieve Goals: Good ?Progress towards PT goals: Progressing toward goals ? ?  ?Frequency ? ? ? BID ? ? ? ?  ?PT Plan Current plan remains appropriate  ? ? ?Co-evaluation   ?  ?  ?  ?  ? ?  ?AM-PAC PT "6 Clicks" Mobility   ?Outcome Measure ? Help needed turning from your back to your side while in a flat bed without using bedrails?: None ?Help needed moving from lying on your back to sitting on the side of a flat bed without using bedrails?: None ?Help needed moving to and from a bed to a chair (including a wheelchair)?: A Little ?Help needed standing up from a chair using your arms (e.g., wheelchair or bedside chair)?: None ?Help needed to walk in hospital room?: A Little ?Help needed climbing 3-5 steps with a railing? : A Little ?6 Click Score: 21 ? ?  ?End of Session   ?Activity Tolerance: Patient tolerated treatment well;Patient limited by fatigue ?Patient left: in bed;with call bell/phone within reach ?Nurse  Communication: Mobility status ?PT Visit Diagnosis: Unsteadiness on feet (R26.81);Other abnormalities of gait and mobility (R26.89);Muscle weakness (generalized) (M62.81) ?  ? ? ?Time: 4098-1191 ?PT Time Calculation (min) (ACUTE ONLY): 32 min ? ?Charges:  $Gait Training: 8-22 mins ?$Therapeutic Exercise: 8-22 mins          ?          ? ?9:39 AM, 05/26/21 ?Lonell Grandchild, MPT ?Physical Therapist with  ?[facility] ?[phone] office ?[phone] mobile phone ? ? ?

## 2021-05-26 NOTE — Progress Notes (Signed)
Foley removed.

## 2021-05-26 NOTE — Discharge Summary (Signed)
Physician Discharge Summary  ?Patient ID: ?Luke Chavez ?MRN: [id number] ?DOB/AGE: 1965-07-19 56 y.o. ? ?Admit date: 05/25/2021 ?Discharge date: 05/26/2021 ? ?Admission Diagnoses: Osteoarthritis left knee ? ?Discharge Diagnoses: Same ? ?Discharged Condition: Stable ? ?Procedure: Left total knee arthroplasty ? ?Implant: DePuy fixed-bearing posterior stabilized attune total knee size 8 femur size 7 tibia size 12 polyethylene insert size 41 patella ? ?Hospital Course:  ? ?Admit date March 24 left total knee uncomplicated spinal anesthesia with saphenous nerve preop block ? ?He underwent therapy and did well with his walking with range of motion 0 to 100 degrees and 100 to 200 feet walking ? ?Postop day 1 March 25 patient stable tolerated physical therapy cleared for discharge ? ? ? ? ? ? ?Discharge Exam: ?BP 128/80   Pulse 88   Temp 97.9 ?F (36.6 ?C) (Oral)   Resp 18   Ht '6\' 2"'$  (1.88 m)   Wt 123.4 kg   SpO2 99%   BMI 34.93 kg/m?  ? ?Mental status awake alert oriented ? ?Neurovascular status normal ? ?Musculoskeletal normal dorsiflexion plantarflexion of the foot no calf pain tenderness or swelling ? ? ?Disposition: Discharge disposition: 01-Home or Self Care ? ? ? ? ? ? ?Discharge Instructions   ? ? Call MD / Call 911   Complete by: As directed ?  ? If you experience chest pain or shortness of breath, CALL 911 and be transported to the hospital emergency room.  If you develope a fever above 101 F, pus (white drainage) or increased drainage or redness at the wound, or calf pain, call your surgeon's office.  ? Constipation Prevention   Complete by: As directed ?  ? Drink plenty of fluids.  Prune juice may be helpful.  You may use a stool softener, such as Colace (over the counter) 100 mg twice a day.  Use MiraLax (over the counter) for constipation as needed.  ? Diet - low sodium heart healthy   Complete by: As directed ?  ? Discharge instructions   Complete by: As directed ?  ? Bone foam the bone foam is designed  to keep your knee straight and help with extension.  Use it 3-4 times a day for 30 minutes ? ?CPM machine use a CPM machine 6 hours a day, you may divide it in any increments that you decide. ?Start at 80 degrees increase 10 degrees/day.  He will use this for 2 weeks ? ?TED hose the white stockings will be worn for 4 weeks ? ?Dressing: You have a waterproof dressing.  After 1 week you can take a shower as long as you have help getting in and out of the shower.  Do not remove the dressing to take the shower it is waterproof.  If the dressing becomes soaked with blood please call the office and we will supply you with a new dressing. ? ? ?Driving if you had right knee surgery you can drive after 28 days if you had left knee surgery you can drive after 1 week provided he can bend the knee enough to get into the driver side of the car ? ?Weightbearing as tolerated with assistive device (crutches or walker) ? ?Make sure you do the exercises as instructed by the therapist. ? ?The ice cuff can be used to help control pain and swelling.  Use it as much as you can.  ? Increase activity slowly as tolerated   Complete by: As directed ?  ? Post-operative opioid taper instructions:  Complete by: As directed ?  ? POST-OPERATIVE OPIOID TAPER INSTRUCTIONS: ?It is important to wean off of your opioid medication as soon as possible. If you do not need pain medication after your surgery it is ok to stop day one. ?Opioids include: ?Codeine, Hydrocodone(Norco, Vicodin), Oxycodone(Percocet, oxycontin) and hydromorphone amongst others.  ?Long term and even short term use of opiods can cause: ?Increased pain response ?Dependence ?Constipation ?Depression ?Respiratory depression ?And more.  ?Withdrawal symptoms can include ?Flu like symptoms ?Nausea, vomiting ?And more ?Techniques to manage these symptoms ?Hydrate well ?Eat regular healthy meals ?Stay active ?Use relaxation techniques(deep breathing, meditating, yoga) ?Do Not substitute  Alcohol to help with tapering ?If you have been on opioids for less than two weeks and do not have pain than it is ok to stop all together.  ?Plan to wean off of opioids ?This plan should start within one week post op of your joint replacement. ?Maintain the same interval or time between taking each dose and first decrease the dose.  ?Cut the total daily intake of opioids by one tablet each day ?Next start to increase the time between doses. ?The last dose that should be eliminated is the evening dose.  ? ?  ? ?  ? ?Allergies as of 05/26/2021   ? ?   Reactions  ? Ketorolac Tromethamine Rash  ? ?  ? ?  ?Medication List  ?  ? ?STOP taking these medications   ? ?acetaminophen 500 MG tablet ?Commonly known as: TYLENOL ?  ?meloxicam 15 MG tablet ?Commonly known as: MOBIC ?  ? ?  ? ?TAKE these medications   ? ?aspirin 325 MG EC tablet ?Take 1 tablet (325 mg total) by mouth daily with breakfast. ?Start taking on: May 27, 2021 ?  ?docusate sodium 100 MG capsule ?Commonly known as: COLACE ?Take 1 capsule (100 mg total) by mouth 2 (two) times daily. ?  ?hydrochlorothiazide 25 MG tablet ?Commonly known as: HYDRODIURIL ?Take 25 mg by mouth daily. ?  ?HYDROcodone-acetaminophen 10-325 MG tablet ?Commonly known as: Norco ?Take 1 tablet by mouth every 4 (four) hours as needed for up to 7 days. ?  ?losartan 100 MG tablet ?Commonly known as: COZAAR ?Take 100 mg by mouth daily. ?  ?methocarbamol 500 MG tablet ?Commonly known as: ROBAXIN ?Take 1 tablet (500 mg total) by mouth every 6 (six) hours as needed for muscle spasms. ?  ?polyethylene glycol 17 g packet ?Commonly known as: MIRALAX / GLYCOLAX ?Take 17 g by mouth daily. ?Start taking on: May 27, 2021 ?  ?traMADol 50 MG tablet ?Commonly known as: ULTRAM ?Take 1 tablet (50 mg total) by mouth every 6 (six) hours for 7 days. ?  ?VITAMIN D3 PO ?Take 1 tablet by mouth every 14 (fourteen) days. ?  ? ?  ? ? ? ?Signed: ?Arther Abbott ?05/26/2021, 12:00 PM ? ? ? ?

## 2021-05-27 DIAGNOSIS — Z87891 Personal history of nicotine dependence: Secondary | ICD-10-CM | POA: Diagnosis not present

## 2021-05-27 DIAGNOSIS — M1711 Unilateral primary osteoarthritis, right knee: Secondary | ICD-10-CM | POA: Diagnosis not present

## 2021-05-27 DIAGNOSIS — M4150 Other secondary scoliosis, site unspecified: Secondary | ICD-10-CM | POA: Diagnosis not present

## 2021-05-27 DIAGNOSIS — Z471 Aftercare following joint replacement surgery: Secondary | ICD-10-CM | POA: Diagnosis not present

## 2021-05-27 DIAGNOSIS — G8929 Other chronic pain: Secondary | ICD-10-CM | POA: Diagnosis not present

## 2021-05-27 DIAGNOSIS — M549 Dorsalgia, unspecified: Secondary | ICD-10-CM | POA: Diagnosis not present

## 2021-05-27 DIAGNOSIS — I10 Essential (primary) hypertension: Secondary | ICD-10-CM | POA: Diagnosis not present

## 2021-05-27 DIAGNOSIS — Z96652 Presence of left artificial knee joint: Secondary | ICD-10-CM | POA: Diagnosis not present

## 2021-05-27 DIAGNOSIS — Z7982 Long term (current) use of aspirin: Secondary | ICD-10-CM | POA: Diagnosis not present

## 2021-05-28 ENCOUNTER — Telehealth: Payer: Self-pay | Admitting: Orthopedic Surgery

## 2021-05-28 NOTE — Telephone Encounter (Signed)
Voice message received Monday, 05/28/21 am, per Maria, [facility] physical therapist, ph [phone], asked for verbal orders for patient after evaluatingg him at home yesterday, 05/27/21; verbal orders for 4 times a week for 1 week, and then 2 times a week for 1 week. Update then received Verdis Frederickson relayed that she already received verbal orders per Abigail Butts, per Dr Aline Brochure.Marland Kitchen ?Done. ?

## 2021-05-29 DIAGNOSIS — I10 Essential (primary) hypertension: Secondary | ICD-10-CM | POA: Diagnosis not present

## 2021-05-29 DIAGNOSIS — Z471 Aftercare following joint replacement surgery: Secondary | ICD-10-CM | POA: Diagnosis not present

## 2021-05-29 DIAGNOSIS — M549 Dorsalgia, unspecified: Secondary | ICD-10-CM | POA: Diagnosis not present

## 2021-05-29 DIAGNOSIS — Z96652 Presence of left artificial knee joint: Secondary | ICD-10-CM | POA: Diagnosis not present

## 2021-05-29 DIAGNOSIS — G8929 Other chronic pain: Secondary | ICD-10-CM | POA: Diagnosis not present

## 2021-05-29 DIAGNOSIS — Z7982 Long term (current) use of aspirin: Secondary | ICD-10-CM | POA: Diagnosis not present

## 2021-05-29 DIAGNOSIS — Z87891 Personal history of nicotine dependence: Secondary | ICD-10-CM | POA: Diagnosis not present

## 2021-05-29 DIAGNOSIS — M4150 Other secondary scoliosis, site unspecified: Secondary | ICD-10-CM | POA: Diagnosis not present

## 2021-05-29 DIAGNOSIS — M1711 Unilateral primary osteoarthritis, right knee: Secondary | ICD-10-CM | POA: Diagnosis not present

## 2021-05-30 ENCOUNTER — Encounter (HOSPITAL_COMMUNITY): Payer: Self-pay | Admitting: Orthopedic Surgery

## 2021-05-30 DIAGNOSIS — Z96652 Presence of left artificial knee joint: Secondary | ICD-10-CM | POA: Diagnosis not present

## 2021-05-30 DIAGNOSIS — I10 Essential (primary) hypertension: Secondary | ICD-10-CM | POA: Diagnosis not present

## 2021-05-30 DIAGNOSIS — M1711 Unilateral primary osteoarthritis, right knee: Secondary | ICD-10-CM | POA: Diagnosis not present

## 2021-05-30 DIAGNOSIS — Z471 Aftercare following joint replacement surgery: Secondary | ICD-10-CM | POA: Diagnosis not present

## 2021-05-30 DIAGNOSIS — M4150 Other secondary scoliosis, site unspecified: Secondary | ICD-10-CM | POA: Diagnosis not present

## 2021-05-30 DIAGNOSIS — Z87891 Personal history of nicotine dependence: Secondary | ICD-10-CM | POA: Diagnosis not present

## 2021-05-30 DIAGNOSIS — M549 Dorsalgia, unspecified: Secondary | ICD-10-CM | POA: Diagnosis not present

## 2021-05-30 DIAGNOSIS — Z7982 Long term (current) use of aspirin: Secondary | ICD-10-CM | POA: Diagnosis not present

## 2021-05-30 DIAGNOSIS — G8929 Other chronic pain: Secondary | ICD-10-CM | POA: Diagnosis not present

## 2021-06-01 DIAGNOSIS — I10 Essential (primary) hypertension: Secondary | ICD-10-CM | POA: Diagnosis not present

## 2021-06-01 DIAGNOSIS — Z96652 Presence of left artificial knee joint: Secondary | ICD-10-CM | POA: Diagnosis not present

## 2021-06-01 DIAGNOSIS — Z87891 Personal history of nicotine dependence: Secondary | ICD-10-CM | POA: Diagnosis not present

## 2021-06-01 DIAGNOSIS — Z7982 Long term (current) use of aspirin: Secondary | ICD-10-CM | POA: Diagnosis not present

## 2021-06-01 DIAGNOSIS — G8929 Other chronic pain: Secondary | ICD-10-CM | POA: Diagnosis not present

## 2021-06-01 DIAGNOSIS — M1711 Unilateral primary osteoarthritis, right knee: Secondary | ICD-10-CM | POA: Diagnosis not present

## 2021-06-01 DIAGNOSIS — M549 Dorsalgia, unspecified: Secondary | ICD-10-CM | POA: Diagnosis not present

## 2021-06-01 DIAGNOSIS — Z471 Aftercare following joint replacement surgery: Secondary | ICD-10-CM | POA: Diagnosis not present

## 2021-06-01 DIAGNOSIS — M4150 Other secondary scoliosis, site unspecified: Secondary | ICD-10-CM | POA: Diagnosis not present

## 2021-06-04 DIAGNOSIS — Z7982 Long term (current) use of aspirin: Secondary | ICD-10-CM | POA: Diagnosis not present

## 2021-06-04 DIAGNOSIS — Z96652 Presence of left artificial knee joint: Secondary | ICD-10-CM | POA: Diagnosis not present

## 2021-06-04 DIAGNOSIS — M549 Dorsalgia, unspecified: Secondary | ICD-10-CM | POA: Diagnosis not present

## 2021-06-04 DIAGNOSIS — M4150 Other secondary scoliosis, site unspecified: Secondary | ICD-10-CM | POA: Diagnosis not present

## 2021-06-04 DIAGNOSIS — M1711 Unilateral primary osteoarthritis, right knee: Secondary | ICD-10-CM | POA: Diagnosis not present

## 2021-06-04 DIAGNOSIS — Z87891 Personal history of nicotine dependence: Secondary | ICD-10-CM | POA: Diagnosis not present

## 2021-06-04 DIAGNOSIS — Z471 Aftercare following joint replacement surgery: Secondary | ICD-10-CM | POA: Diagnosis not present

## 2021-06-04 DIAGNOSIS — G8929 Other chronic pain: Secondary | ICD-10-CM | POA: Diagnosis not present

## 2021-06-04 DIAGNOSIS — I10 Essential (primary) hypertension: Secondary | ICD-10-CM | POA: Diagnosis not present

## 2021-06-06 DIAGNOSIS — I10 Essential (primary) hypertension: Secondary | ICD-10-CM | POA: Diagnosis not present

## 2021-06-06 DIAGNOSIS — Z96652 Presence of left artificial knee joint: Secondary | ICD-10-CM | POA: Diagnosis not present

## 2021-06-06 DIAGNOSIS — M4150 Other secondary scoliosis, site unspecified: Secondary | ICD-10-CM | POA: Diagnosis not present

## 2021-06-06 DIAGNOSIS — Z87891 Personal history of nicotine dependence: Secondary | ICD-10-CM | POA: Diagnosis not present

## 2021-06-06 DIAGNOSIS — Z7982 Long term (current) use of aspirin: Secondary | ICD-10-CM | POA: Diagnosis not present

## 2021-06-06 DIAGNOSIS — M549 Dorsalgia, unspecified: Secondary | ICD-10-CM | POA: Diagnosis not present

## 2021-06-06 DIAGNOSIS — M1711 Unilateral primary osteoarthritis, right knee: Secondary | ICD-10-CM | POA: Diagnosis not present

## 2021-06-06 DIAGNOSIS — Z471 Aftercare following joint replacement surgery: Secondary | ICD-10-CM | POA: Diagnosis not present

## 2021-06-06 DIAGNOSIS — G8929 Other chronic pain: Secondary | ICD-10-CM | POA: Diagnosis not present

## 2021-06-07 ENCOUNTER — Ambulatory Visit (INDEPENDENT_AMBULATORY_CARE_PROVIDER_SITE_OTHER): Payer: BC Managed Care – PPO | Admitting: Orthopedic Surgery

## 2021-06-07 DIAGNOSIS — Z96652 Presence of left artificial knee joint: Secondary | ICD-10-CM

## 2021-06-07 NOTE — Progress Notes (Signed)
FOLLOW UP  ? ?Encounter Diagnosis  ?Name Primary?  ? Status post total left knee replacement Yes  ? ? ? ?Chief Complaint  ?Patient presents with  ? Follow-up  ?  Recheck on left knee, DOS 05-25-21. ?  ? ? ? ?Postop day 13 left total knee ? ?Therapy indicates patient's range of motion 8-92 ? ?Patient not taking a lot of medication ? ?I emphasized the need to work on the bone foam he says it hurts I told him he has to do it ? ?I took half of his staples out his wound looks good I will take the other half out on Monday ? ?He starts therapy tomorrow ? ? ?

## 2021-06-08 ENCOUNTER — Ambulatory Visit (HOSPITAL_COMMUNITY): Payer: BC Managed Care – PPO | Attending: Orthopedic Surgery | Admitting: Physical Therapy

## 2021-06-08 ENCOUNTER — Encounter (HOSPITAL_COMMUNITY): Payer: Self-pay | Admitting: Physical Therapy

## 2021-06-08 DIAGNOSIS — M25562 Pain in left knee: Secondary | ICD-10-CM | POA: Diagnosis not present

## 2021-06-08 DIAGNOSIS — G8929 Other chronic pain: Secondary | ICD-10-CM | POA: Diagnosis not present

## 2021-06-08 DIAGNOSIS — R2689 Other abnormalities of gait and mobility: Secondary | ICD-10-CM | POA: Insufficient documentation

## 2021-06-08 DIAGNOSIS — R29898 Other symptoms and signs involving the musculoskeletal system: Secondary | ICD-10-CM | POA: Insufficient documentation

## 2021-06-08 DIAGNOSIS — M1712 Unilateral primary osteoarthritis, left knee: Secondary | ICD-10-CM | POA: Insufficient documentation

## 2021-06-08 DIAGNOSIS — M25662 Stiffness of left knee, not elsewhere classified: Secondary | ICD-10-CM | POA: Insufficient documentation

## 2021-06-08 DIAGNOSIS — M25512 Pain in left shoulder: Secondary | ICD-10-CM | POA: Diagnosis not present

## 2021-06-08 NOTE — Therapy (Signed)
?OUTPATIENT PHYSICAL THERAPY LOWER EXTREMITY EVALUATION ? ? ?Patient Name: Luke Chavez ?MRN: [id number] ?DOB:12/31/1965, 56 y.o., male ?Today's Date: 06/08/2021 ? ? PT End of Session - 06/08/21 1106   ? ? Visit Number 1   ? Number of Visits 18   ? Date for PT Re-Evaluation 07/20/21   ? Authorization Type [employer] COMM PPO   ? PT Start Time 1032   ? PT Stop Time 1110   ? PT Time Calculation (min) 38 min   ? Activity Tolerance Patient tolerated treatment well   ? Behavior During Therapy [facility] for tasks assessed/performed   ? ?  ?  ? ?  ? ? ?Past Medical History:  ?Diagnosis Date  ? Arthritis   ? Chronic back pain   ? Hypertension   ? ?Past Surgical History:  ?Procedure Laterality Date  ? COLONOSCOPY N/A 04/17/2015  ? Procedure: COLONOSCOPY;  Surgeon: Danie Binder, MD;  Location: [facility];  Service: Endoscopy;  Laterality: N/A;  1:00 PM  ? KNEE ARTHROSCOPY WITH MEDIAL MENISECTOMY Left 04/24/2017  ? Procedure: KNEE ARTHROSCOPY WITH MEDIAL MENISECTOMY;  Surgeon: Carole Civil, MD;  Location: [facility] ORS;  Service: Orthopedics;  Laterality: Left;  ? TOTAL KNEE ARTHROPLASTY Left 05/25/2021  ? Procedure: TOTAL KNEE ARTHROPLASTY;  Surgeon: Carole Civil, MD;  Location: [facility] ORS;  Service: Orthopedics;  Laterality: Left;  ? ?Patient Active Problem List  ? Diagnosis Date Noted  ? Osteoarthritis of left knee 05/25/2021  ? Compression fracture of lumbar spine, non-traumatic ([place]) 07/04/2020  ? Degenerative scoliosis 07/04/2020  ? Lumbar pain 07/04/2020  ? Medial meniscus, posterior horn derangement 05/13/2017  ? S/P left knee arthroscopy 04/24/17   ? Primary osteoarthritis of left knee   ? ? ?PCP: Sharilyn Sites, MD ? ?REFERRING PROVIDER: Carole Civil, MD ? ?REFERRING DIAG: M17.12 (ICD-10-CM) - Primary osteoarthritis of left knee  ? ?THERAPY DIAG:  ?Left knee pain, unspecified chronicity ? ?Stiffness of left knee, not elsewhere classified ? ?Other abnormalities of gait and mobility ? ?ONSET DATE: 05/25/21 ? ?SUBJECTIVE:   ? ?SUBJECTIVE STATEMENT: ?Patient presents to therapy with complaint of LT knee pain s/p LT TKA on 05/25/21. He is doing well overall. Had [facility] therapy for the past 2 weeks. Pain is well managed with medication. Still notes ongoing swelling. Is wearing compression stockings. Is currently ambulating using RW. Using CPM daily, currently has set to about 83 degrees of flexion.  ? ?PERTINENT HISTORY: ?LT TKA 05/25/21 ? ?PAIN:  ?Are you having pain? Yes: NPRS scale: 3/10 ?Pain location: LT knee ?Pain description: stiff, swollen, aching, stiff ?Aggravating factors: bending, WB, walking  ?Relieving factors: rest, meds  ? ?PRECAUTIONS: None ? ?WEIGHT BEARING RESTRICTIONS No ? ?FALLS:  ?Has patient fallen in last 6 months? No ? ?LIVING ENVIRONMENT: ?Lives with: lives with their spouse ?Lives in: House/apartment ? ?OCCUPATION: Refrigeration installer  ? ?PLOF: Independent ? ?PATIENT GOALS "Get this knee where it needs to be" (ROM) ? ? ?OBJECTIVE:  ? ?DIAGNOSTIC FINDINGS: NA ? ? ?COGNITION: ? Overall cognitive status: Within functional limits for tasks assessed   ?  ?SENSATION: ?Light touch: Impaired  ? ? ?PALPATION: ?Increased TTP diffuse about LT knee joint  ? ?LE ROM: ? ?Active ROM Right ?06/08/2021 Left ?06/08/2021  ?Hip flexion    ?Hip extension    ?Hip abduction    ?Hip adduction    ?Hip internal rotation    ?Hip external rotation    ?Knee flexion  83  ?Knee extension  -12  ?  Ankle dorsiflexion    ?Ankle plantarflexion    ?Ankle inversion    ?Ankle eversion    ? (Blank rows = not tested) ? ?LE MMT: ? ?MMT Right ?06/08/2021 Left ?06/08/2021  ?Hip flexion 5 3+  ?Hip extension    ?Hip abduction    ?Hip adduction    ?Hip internal rotation    ?Hip external rotation    ?Knee flexion    ?Knee extension 5 3+  ?Ankle dorsiflexion 5  5  ?Ankle plantarflexion    ?Ankle inversion    ?Ankle eversion    ? (Blank rows = not tested) ? ? ?GAIT: ?Distance walked: 235 feet ?Assistive device utilized: Environmental consultant - 2 wheeled ?Level of assistance: Modified  independence ?Comments: Decreased LT knee and hip flexion ? ? ? ?TODAY'S TREATMENT: ?06/08/21 ?Quad set ?Glute set ?Heel slide ?SLR (partial range) ?Heel prop ?Ankle pump  ? ? ?PATIENT EDUCATION:  ?Education details: on evaluation findings, POC and HEP  ?Person educated: Patient ?Education method: Explanation and Demonstration ?Education comprehension: verbalized understanding and returned demonstration ? ? ?HOME EXERCISE PROGRAM: ?Access Code: [email or web address] ?URL: [email or web address] ?Date: 06/08/2021 ?Prepared by: Josue Hector ? ?Exercises ?- Supine Quad Set  - 3 x daily - 7 x weekly - 2 sets - 10 reps - 5 second hold ?- Supine Heel Slide with Strap  - 3 x daily - 7 x weekly - 2 sets - 10 reps - 5 second hold ?- Active Straight Leg Raise with Quad Set  - 3 x daily - 7 x weekly - 2 sets - 10 reps ?- Supine Ankle Pumps  - 3 x daily - 7 x weekly - 2 sets - 10 reps ?- Supine Gluteal Sets  - 3 x daily - 7 x weekly - 2 sets - 10 reps - 5 second hold ?- Supine Knee Extension Mobilization with Weight  - 3 x daily - 7 x weekly - 1 sets - 1 reps - 3-5 minutes hold ? ?ASSESSMENT: ? ?CLINICAL IMPRESSION: ?Patient is a 56 y.o. male who presents to physical therapy with complaint of Lt knee pain s/p LT TKA. Patient demonstrates muscle weakness, reduced ROM, joint swelling, gait and balance deficits which are likely contributing to symptoms of pain and are negatively impacting patient ability to perform ADLs and functional mobility tasks. Patient will benefit from skilled physical therapy services to address these deficits to reduce pain and improve level of function with ADLs and functional mobility tasks. ? ? ? ?OBJECTIVE IMPAIRMENTS Abnormal gait, decreased activity tolerance, decreased balance, decreased mobility, difficulty walking, decreased ROM, decreased strength, hypomobility, increased edema, increased fascial restrictions, impaired flexibility, improper body mechanics, and pain.  ? ?ACTIVITY LIMITATIONS  cleaning, community activity, driving, meal prep, occupation, laundry, yard work, shopping, and yard work.  ? ?PERSONAL FACTORS  No   are also affecting patient's functional outcome.  ? ? ?REHAB POTENTIAL: Good ? ?CLINICAL DECISION MAKING: Stable/uncomplicated ? ?EVALUATION COMPLEXITY: Low ? ? ?GOALS: ?SHORT TERM GOALS: Target date: 06/29/2021 ? ?Patient will be independent with initial HEP and self-management strategies to improve functional outcomes ?Baseline:  ?Goal status: INITIAL  ? ?LONG TERM GOALS: Target date: 07/20/2021 ? ?Patient will be independent with advanced HEP and self-management strategies to improve functional outcomes ?Baseline:  ?Goal status: INITIAL ? ?2.  Patient will be able to ambulate at least 400 feet during 2MWT with LRAD to demonstrate improved ability to perform functional mobility and associated tasks. ?Baseline:  ?Goal status: INITIAL ? ?3.  Patient will have LT  knee AROM 0-120 degrees to improve functional mobility and facilitate squatting to pick up items from floor. ?Baseline:  ?Goal status: INITIAL ? ?4. Patient will have equal to or > 4+/5 MMT throughout LLE to improve ability to perform functional mobility, stair ambulation and ADLs.  ?Baseline:  ?Goal status: INITIAL ? ? ? ?PLAN: ?PT FREQUENCY: 3x/week ? ?PT DURATION: 6 weeks ? ?PLANNED INTERVENTIONS: Therapeutic exercises, Therapeutic activity, Neuromuscular re-education, Balance training, Gait training, Patient/Family education, and Joint mobilization ? ?PLAN FOR NEXT SESSION: Review HEP. Progress knee mobility, glute and quad strength as tolerated. Manual as needed for improving ROM.  ? ?11:07 AM, 06/08/21 ?Josue Hector PT DPT  ?Physical Therapist with [place]  ?[facility]  ?[phone] ? ?

## 2021-06-11 ENCOUNTER — Ambulatory Visit (INDEPENDENT_AMBULATORY_CARE_PROVIDER_SITE_OTHER): Payer: BC Managed Care – PPO | Admitting: Orthopedic Surgery

## 2021-06-11 DIAGNOSIS — Z96652 Presence of left artificial knee joint: Secondary | ICD-10-CM

## 2021-06-11 DIAGNOSIS — G8918 Other acute postprocedural pain: Secondary | ICD-10-CM

## 2021-06-11 MED ORDER — TRAMADOL HCL 50 MG PO TABS: 50.0000 mg | ORAL_TABLET | Freq: Four times a day (QID) | ORAL | 5 refills | Status: DC | PRN

## 2021-06-11 NOTE — Progress Notes (Signed)
.  cc ?Chief Complaint  ?Patient presents with  ? Follow-up  ?  Recheck on left knee, DOS 05-25-21.  ? ?Luke Chavez came in today to get the rest of his staples out.  His wound looks good he is bending his knee he is walking with a cane he wanted his tramadol refilled admitted that we will see him in 2 weeks ? ?Encounter Diagnoses  ?Name Primary?  ? Post-operative pain Yes  ? Status post total left knee replacement   ? ? ?

## 2021-06-13 ENCOUNTER — Encounter (HOSPITAL_COMMUNITY): Payer: Self-pay

## 2021-06-13 ENCOUNTER — Ambulatory Visit (HOSPITAL_COMMUNITY): Payer: BC Managed Care – PPO

## 2021-06-13 DIAGNOSIS — M25662 Stiffness of left knee, not elsewhere classified: Secondary | ICD-10-CM | POA: Diagnosis not present

## 2021-06-13 DIAGNOSIS — G8929 Other chronic pain: Secondary | ICD-10-CM | POA: Diagnosis not present

## 2021-06-13 DIAGNOSIS — R2689 Other abnormalities of gait and mobility: Secondary | ICD-10-CM | POA: Diagnosis not present

## 2021-06-13 DIAGNOSIS — M25562 Pain in left knee: Secondary | ICD-10-CM

## 2021-06-13 DIAGNOSIS — R29898 Other symptoms and signs involving the musculoskeletal system: Secondary | ICD-10-CM | POA: Diagnosis not present

## 2021-06-13 DIAGNOSIS — M1712 Unilateral primary osteoarthritis, left knee: Secondary | ICD-10-CM | POA: Diagnosis not present

## 2021-06-13 DIAGNOSIS — M25512 Pain in left shoulder: Secondary | ICD-10-CM | POA: Diagnosis not present

## 2021-06-13 NOTE — Therapy (Signed)
?OUTPATIENT PHYSICAL THERAPY TREATMENT NOTE ? ? ?Patient Name: Luke Chavez ?MRN: [id number] ?DOB:02-13-1966, 56 y.o., male ?Today's Date: 06/13/2021 ? ?PCP: Sharilyn Sites, MD ?REFERRING PROVIDER: Carole Civil, MD ? ?END OF SESSION:  ? PT End of Session - 06/13/21 1633   ? ? Visit Number 2   ? Number of Visits 18   ? Date for PT Re-Evaluation 07/20/21   ? Authorization Type BCBS COMM PPO   ? PT Start Time 1535   ? PT Stop Time 1615   ? PT Time Calculation (min) 40 min   ? Activity Tolerance Patient tolerated treatment well   ? Behavior During Therapy [facility] for tasks assessed/performed   ? ?  ?  ? ?  ? ? ?Past Medical History:  ?Diagnosis Date  ? Arthritis   ? Chronic back pain   ? Hypertension   ? ?Past Surgical History:  ?Procedure Laterality Date  ? COLONOSCOPY N/A 04/17/2015  ? Procedure: COLONOSCOPY;  Surgeon: Danie Binder, MD;  [facility];  Service: Endoscopy;  Laterality: N/A;  1:00 PM  ? KNEE ARTHROSCOPY WITH MEDIAL MENISECTOMY Left 04/24/2017  ? Procedure: KNEE ARTHROSCOPY WITH MEDIAL MENISECTOMY;  Surgeon: Carole Civil, MD;  Location: [facility];  Service: Orthopedics;  Laterality: Left;  ? TOTAL KNEE ARTHROPLASTY Left 05/25/2021  ? Procedure: TOTAL KNEE ARTHROPLASTY;  Surgeon: Carole Civil, MD;  Location: [facility];  Service: Orthopedics;  Laterality: Left;  ? ?Patient Active Problem List  ? Diagnosis Date Noted  ? Osteoarthritis of left knee 05/25/2021  ? Compression fracture of lumbar spine, non-traumatic ([place]) 07/04/2020  ? Degenerative scoliosis 07/04/2020  ? Lumbar pain 07/04/2020  ? Medial meniscus, posterior horn derangement 05/13/2017  ? S/P left knee arthroscopy 04/24/17   ? Primary osteoarthritis of left knee   ? ? ?REFERRING DIAG: Primary osteoarthritis of left knee  ? ?THERAPY DIAG:  ?Left knee pain, unspecified chronicity ? ?Stiffness of left knee, not elsewhere classified ? ?Other abnormalities of gait and mobility ?Left knee pain, unspecified chronicity ?  ?Stiffness  of left knee, not elsewhere classified ?  ?Other abnormalities of gait and mobility ? ?PERTINENT HISTORY: LT TKA 05/25/21 ? ?PRECAUTIONS: None ? ?SUBJECTIVE: Pt reports he has been riding CPM 6 hrs a day, able to reach 100 degrees, more comfortable at 90 degree range.  Has began HEP without questions. ? ?PAIN:  ?Are you having pain? Yes: NPRS scale: 3/10 ?Pain location: Lt knee ?Pain description: stiff and achey ?Aggravating factors: bending ?Relieving factors: CPM ? ? ? ? ?OBJECTIVE:  ? ?PALPATION: ?Increased TTP diffuse about LT knee joint  ?  ?LE ROM: ?  ?Active ROM Right ?06/08/2021 Left ?06/08/2021 Left ?06/13/21  ?Hip flexion       ?Hip extension       ?Hip abduction       ?Hip adduction       ?Hip internal rotation       ?Hip external rotation       ?Knee flexion   83 94  ?Knee extension   -12 -8  ?Ankle dorsiflexion       ?Ankle plantarflexion       ?Ankle inversion       ?Ankle eversion       ? (Blank rows = not tested) ?  ?LE MMT: ?  ?MMT Right ?06/08/2021 Left ?06/08/2021  ?Hip flexion 5 3+  ?Hip extension      ?Hip abduction      ?Hip adduction      ?  Hip internal rotation      ?Hip external rotation      ?Knee flexion      ?Knee extension 5 3+  ?Ankle dorsiflexion 5  5  ?Ankle plantarflexion      ?Ankle inversion      ?Ankle eversion      ? (Blank rows = not tested) ?  ?  ?GAIT: ?Distance walked: 235 feet ?Assistive device utilized: Environmental consultant - 2 wheeled ?Level of assistance: Modified independence ?Comments: Decreased LT knee and hip flexion ?  ?  ?  ?TODAY'S TREATMENT: ?06/12/21: ?Supine: Retrograde massage with LE elevated ? Quad set ? Glut set ? Heel slide 10 ? SLR ? SAQ ?Standing: ? Knee drive 5x 10" on 01UU step ? Heel raise10 ? TKE GTB 10x 5"- HEP ?Bike seat 21 rocking with hold end range ?06/08/21 ?Quad set ?Glute set ?Heel slide ?SLR (partial range) ?Heel prop ?Ankle pump  ?  ?  ?PATIENT EDUCATION:  ?Education details: Reviewed goals, educated importance of HEP compliance for maximal benefits, pt able to recall  and demonstrate appropriate mechanics. ?Person educated: Patient ?Education method: Explanation and Demonstration ?Education comprehension: verbalized understanding and returned demonstration ?  ?  ?HOME EXERCISE PROGRAM: ?Access Code: [email or web address] ?URL: [email or web address] ?Date: 06/08/2021 ?Prepared by: Josue Hector ?  ?Exercises ?- Supine Quad Set  - 3 x daily - 7 x weekly - 2 sets - 10 reps - 5 second hold ?- Supine Heel Slide with Strap  - 3 x daily - 7 x weekly - 2 sets - 10 reps - 5 second hold ?- Active Straight Leg Raise with Quad Set  - 3 x daily - 7 x weekly - 2 sets - 10 reps ?- Supine Ankle Pumps  - 3 x daily - 7 x weekly - 2 sets - 10 reps ?- Supine Gluteal Sets  - 3 x daily - 7 x weekly - 2 sets - 10 reps - 5 second hold ?- Supine Knee Extension Mobilization with Weight  - 3 x daily - 7 x weekly - 1 sets - 1 reps - 3-5 minutes hold ?  ?ASSESSMENT: ?  ?CLINICAL IMPRESSION: ?Reviewed goals, educated importance of HEP compliance for maximal benefits, pt able to recall and demonstrate appropriate mechanics.  Began session with manual retrograde massage for edema control prior knee mobility exercises.  Pt progressing well with mobility, improved AROM 8-94 degrees.  Added standing knee drive and TKE for mobility.  EOS on bike rocking with hold end range.  No reports of increased pain through session.   ?  ?  ?OBJECTIVE IMPAIRMENTS Abnormal gait, decreased activity tolerance, decreased balance, decreased mobility, difficulty walking, decreased ROM, decreased strength, hypomobility, increased edema, increased fascial restrictions, impaired flexibility, improper body mechanics, and pain.  ?  ?ACTIVITY LIMITATIONS cleaning, community activity, driving, meal prep, occupation, laundry, yard work, shopping, and yard work.  ?  ?PERSONAL FACTORS  No   are also affecting patient's functional outcome.  ?  ?  ?REHAB POTENTIAL: Good ?  ?CLINICAL DECISION MAKING: Stable/uncomplicated ?  ?EVALUATION  COMPLEXITY: Low ?  ?  ?GOALS: ?SHORT TERM GOALS: Target date: 06/29/2021 ?  ?Patient will be independent with initial HEP and self-management strategies to improve functional outcomes ?Baseline:  ?Goal status: Ongoing ?  ?LONG TERM GOALS: Target date: 07/20/2021 ?  ?Patient will be independent with advanced HEP and self-management strategies to improve functional outcomes ?Baseline:  ?Goal status: Ongoing ?  ?2.  Patient will be able to ambulate at least  400 feet during 2MWT with LRAD to demonstrate improved ability to perform functional mobility and associated tasks. ?Baseline:  ?Goal status: Ongoing ?  ?3.  Patient will have LT knee AROM 0-120 degrees to improve functional mobility and facilitate squatting to pick up items from floor. ?Baseline:  ?Goal status:Ongoing ?  ?4. Patient will have equal to or > 4+/5 MMT throughout LLE to improve ability to perform functional mobility, stair ambulation and ADLs.  ?Baseline:  ?Goal status: Ongoing ?  ?  ?  ?PLAN: ?PT FREQUENCY: 3x/week ?  ?PT DURATION: 6 weeks ?  ?PLANNED INTERVENTIONS: Therapeutic exercises, Therapeutic activity, Neuromuscular re-education, Balance training, Gait training, Patient/Family education, and Joint mobilization ?  ?PLAN FOR NEXT SESSION: Progress knee mobility, glute and quad strength as tolerated. Manual as needed for improving ROM.  ? ?Ihor Austin, LPTA/CLT; CBIS ?[phone] ? ?Aldona Lento, PTA ?06/13/2021, 6:01 PM ? ?  ? ?

## 2021-06-14 ENCOUNTER — Ambulatory Visit (HOSPITAL_COMMUNITY): Payer: BC Managed Care – PPO | Admitting: Physical Therapy

## 2021-06-14 DIAGNOSIS — M1712 Unilateral primary osteoarthritis, left knee: Secondary | ICD-10-CM | POA: Diagnosis not present

## 2021-06-14 DIAGNOSIS — R2689 Other abnormalities of gait and mobility: Secondary | ICD-10-CM | POA: Diagnosis not present

## 2021-06-14 DIAGNOSIS — G8929 Other chronic pain: Secondary | ICD-10-CM | POA: Diagnosis not present

## 2021-06-14 DIAGNOSIS — M25662 Stiffness of left knee, not elsewhere classified: Secondary | ICD-10-CM | POA: Diagnosis not present

## 2021-06-14 DIAGNOSIS — M25562 Pain in left knee: Secondary | ICD-10-CM

## 2021-06-14 DIAGNOSIS — R29898 Other symptoms and signs involving the musculoskeletal system: Secondary | ICD-10-CM | POA: Diagnosis not present

## 2021-06-14 DIAGNOSIS — M25512 Pain in left shoulder: Secondary | ICD-10-CM | POA: Diagnosis not present

## 2021-06-14 NOTE — Therapy (Signed)
?OUTPATIENT PHYSICAL THERAPY TREATMENT NOTE ? ? ?Patient Name: Luke Chavez ?MRN: [id number] ?DOB:1965/09/21, 56 y.o., male ?Today's Date: 06/14/2021 ? ?PCP: Sharilyn Sites, MD ?REFERRING PROVIDER: Carole Civil, MD ? ?END OF SESSION:  ? PT End of Session - 06/14/21 1411   ? ? Visit Number 3   ? Number of Visits 18   ? Date for PT Re-Evaluation 07/20/21   ? Authorization Type BCBS COMM PPO   ? PT Start Time 1406   ? PT Stop Time 3710   ? PT Time Calculation (min) 40 min   ? Activity Tolerance Patient tolerated treatment well   ? Behavior During Therapy [facility] for tasks assessed/performed   ? ?  ?  ? ?  ? ? ?Past Medical History:  ?Diagnosis Date  ? Arthritis   ? Chronic back pain   ? Hypertension   ? ?Past Surgical History:  ?Procedure Laterality Date  ? COLONOSCOPY N/A 04/17/2015  ? Procedure: COLONOSCOPY;  Surgeon: Danie Binder, MD;  [facility];  Service: Endoscopy;  Laterality: N/A;  1:00 PM  ? KNEE ARTHROSCOPY WITH MEDIAL MENISECTOMY Left 04/24/2017  ? Procedure: KNEE ARTHROSCOPY WITH MEDIAL MENISECTOMY;  Surgeon: Carole Civil, MD;  Location: [facility];  Service: Orthopedics;  Laterality: Left;  ? TOTAL KNEE ARTHROPLASTY Left 05/25/2021  ? Procedure: TOTAL KNEE ARTHROPLASTY;  Surgeon: Carole Civil, MD;  Location: [facility];  Service: Orthopedics;  Laterality: Left;  ? ?Patient Active Problem List  ? Diagnosis Date Noted  ? Osteoarthritis of left knee 05/25/2021  ? Compression fracture of lumbar spine, non-traumatic ([place]) 07/04/2020  ? Degenerative scoliosis 07/04/2020  ? Lumbar pain 07/04/2020  ? Medial meniscus, posterior horn derangement 05/13/2017  ? S/P left knee arthroscopy 04/24/17   ? Primary osteoarthritis of left knee   ? ? ?REFERRING DIAG: Primary osteoarthritis of left knee  ? ?THERAPY DIAG:  ?Left knee pain, unspecified chronicity ? ?Stiffness of left knee, not elsewhere classified ? ?Other abnormalities of gait and mobility ?Left knee pain, unspecified chronicity ?  ?Stiffness  of left knee, not elsewhere classified ?  ?Other abnormalities of gait and mobility ? ?PERTINENT HISTORY: LT TKA 05/25/21 ? ?PRECAUTIONS: None ? ?SUBJECTIVE: Pt reports he has been riding CPM 6 hrs a day, able to reach 100 degrees, more comfortable at 90 degree range.  Has began HEP without questions. ? ?PAIN:  ?Are you having pain? Yes: NPRS scale: 3/10 ?Pain location: Lt knee ?Pain description: stiff and achey ?Aggravating factors: bending ?Relieving factors: CPM ? ? ? ? ?OBJECTIVE:  ? ?PALPATION: ?Increased TTP diffuse about LT knee joint  ?  ?LE ROM: ?  ?Active ROM Left ?06/08/2021 Left ?06/13/21  ?Knee flexion 83 94  ?Knee extension -12 -8  ?   ?LE MMT: ?  ?MMT Right ?06/08/2021 Left ?06/08/2021  ?Hip flexion 5 3+  ?Hip extension      ?Hip abduction      ?Hip adduction      ?Hip internal rotation      ?Hip external rotation      ?Knee flexion      ?Knee extension 5 3+  ?Ankle dorsiflexion 5  5  ?Ankle plantarflexion      ?Ankle inversion      ?Ankle eversion      ? (Blank rows = not tested) ?  ?  ?GAIT: ?At evaluation: Distance walked: 235 feet ?Assistive device utilized: Environmental consultant - 2 wheeled ?Level of assistance: Modified independence ?Comments: Decreased LT knee  and hip flexion ?  ?  ?  ?TODAY'S TREATMENT: ? ?06/14/21: ?Bike seat 21 rocking with hold end range 5 minutes ?Standing: ? Lt Knee drive 62U 10" on 63FH step ? Heel raise 10X on incline ? Lt Knee flexion 10X ?Supine:  ? Quad set 10X 5" holds ? SAQ 10X5" holds ? Heel slide 10X ? SLR 10X ?Manual:  ?Retrograde massage with LE elevated ?  ? ?  ? ?06/12/21: ?Supine: Retrograde massage with LE elevated ? Quad set ? Glut set ? Heel slide 10 ? SLR ? SAQ ?Standing: ? Knee drive 5x 10" on 54TG step ? Heel raise10 ? TKE GTB 10x 5"- HEP ?Bike seat 21 rocking with hold end range ? ?06/08/21 ?Quad set ?Glute set ?Heel slide ?SLR (partial range) ?Heel prop ?Ankle pump  ?  ?  ?PATIENT EDUCATION:  ?Education details: Reviewed goals, educated importance of HEP compliance for maximal  benefits, pt able to recall and demonstrate appropriate mechanics. ?Person educated: Patient ?Education method: Explanation and Demonstration ?Education comprehension: verbalized understanding and returned demonstration ?  ?  ?HOME EXERCISE PROGRAM: ?Access Code: [email or web address] ?URL: [email or web address] ?Date: 06/08/2021 ?Prepared by: Josue Hector ?  ?Exercises ?- Supine Quad Set  - 3 x daily - 7 x weekly - 2 sets - 10 reps - 5 second hold ?- Supine Heel Slide with Strap  - 3 x daily - 7 x weekly - 2 sets - 10 reps - 5 second hold ?- Active Straight Leg Raise with Quad Set  - 3 x daily - 7 x weekly - 2 sets - 10 reps ?- Supine Ankle Pumps  - 3 x daily - 7 x weekly - 2 sets - 10 reps ?- Supine Gluteal Sets  - 3 x daily - 7 x weekly - 2 sets - 10 reps - 5 second hold ?- Supine Knee Extension Mobilization with Weight  - 3 x daily - 7 x weekly - 1 sets - 1 reps - 3-5 minutes hold ?  ?ASSESSMENT: ?  ?CLINICAL IMPRESSION: ?Pt progressing well, ambulating with SPC at this time.  States he continues to use his CPM daily.  AROM remains at 8-94 degrees following manual this session.  Continued with standing knee drive and added hamstring curls to session today.  Minimal induration/edema and no erythema present perimeter of knee today.  Demonstrated and encouraged sitting EOB or on a surface where he can swing his leg.  Completed today with retro massage to reduce edema and pain in knee.  No reports of increased pain through session or at end of session.   ?  ?OBJECTIVE IMPAIRMENTS Abnormal gait, decreased activity tolerance, decreased balance, decreased mobility, difficulty walking, decreased ROM, decreased strength, hypomobility, increased edema, increased fascial restrictions, impaired flexibility, improper body mechanics, and pain.  ?  ?ACTIVITY LIMITATIONS cleaning, community activity, driving, meal prep, occupation, laundry, yard work, shopping, and yard work.  ?  ?  ?GOALS: ?SHORT TERM GOALS: Target date:  06/29/2021 ?  ?Patient will be independent with initial HEP and self-management strategies to improve functional outcomes ?Baseline:  ?Goal status: Ongoing ?  ?LONG TERM GOALS: Target date: 07/20/2021 ?  ?Patient will be independent with advanced HEP and self-management strategies to improve functional outcomes ?Baseline:  ?Goal status: Ongoing ?  ?2.  Patient will be able to ambulate at least 400 feet during 2MWT with LRAD to demonstrate improved ability to perform functional mobility and associated tasks. ?Baseline:  ?Goal status: Ongoing ?  ?3.  Patient will have  LT knee AROM 0-120 degrees to improve functional mobility and facilitate squatting to pick up items from floor. ?Baseline:  ?Goal status:Ongoing ?  ?4. Patient will have equal to or > 4+/5 MMT throughout LLE to improve ability to perform functional mobility, stair ambulation and ADLs.  ?Baseline:  ?Goal status: Ongoing ?  ?  ?  ?PLAN: ?PT FREQUENCY: 3x/week ?  ?PT DURATION: 6 weeks ?  ?PLANNED INTERVENTIONS: Therapeutic exercises, Therapeutic activity, Neuromuscular re-education, Balance training, Gait training, Patient/Family education, and Joint mobilization ?  ?PLAN FOR NEXT SESSION: Progress knee mobility, glute and quad strength as tolerated. Manual as needed for improving ROM.  ? Teena Irani, PTA ?06/14/2021, 3:26 PM ?Teena Irani, PTA/CLT, WTA ?[phone]  ? ?  ? ?

## 2021-06-19 ENCOUNTER — Ambulatory Visit (HOSPITAL_COMMUNITY): Payer: BC Managed Care – PPO | Admitting: Physical Therapy

## 2021-06-19 ENCOUNTER — Encounter (HOSPITAL_COMMUNITY): Payer: Self-pay | Admitting: Physical Therapy

## 2021-06-19 DIAGNOSIS — M25562 Pain in left knee: Secondary | ICD-10-CM | POA: Diagnosis not present

## 2021-06-19 DIAGNOSIS — R2689 Other abnormalities of gait and mobility: Secondary | ICD-10-CM

## 2021-06-19 DIAGNOSIS — M25512 Pain in left shoulder: Secondary | ICD-10-CM | POA: Diagnosis not present

## 2021-06-19 DIAGNOSIS — G8929 Other chronic pain: Secondary | ICD-10-CM | POA: Diagnosis not present

## 2021-06-19 DIAGNOSIS — M25662 Stiffness of left knee, not elsewhere classified: Secondary | ICD-10-CM

## 2021-06-19 DIAGNOSIS — R29898 Other symptoms and signs involving the musculoskeletal system: Secondary | ICD-10-CM | POA: Diagnosis not present

## 2021-06-19 DIAGNOSIS — M1712 Unilateral primary osteoarthritis, left knee: Secondary | ICD-10-CM | POA: Diagnosis not present

## 2021-06-19 NOTE — Therapy (Signed)
?OUTPATIENT PHYSICAL THERAPY TREATMENT NOTE ? ? ?Patient Name: Luke Chavez ?MRN: [id number] ?DOB:[birth year], 56 y.o., male ?Today's Date: 06/19/2021 ? ?PCP: Sharilyn Sites, MD ?REFERRING PROVIDER: Carole Civil, MD ? ?END OF SESSION:  ? PT End of Session - 06/19/21 1105   ? ? Visit Number 4   ? Number of Visits 18   ? Date for PT Re-Evaluation 07/20/21   ? Authorization Type BCBS COMM PPO   ? PT Start Time 1048   ? PT Stop Time 1128   ? PT Time Calculation (min) 40 min   ? Activity Tolerance Patient tolerated treatment well   ? Behavior During Therapy [facility] for tasks assessed/performed   ? ?  ?  ? ?  ? ? ?Past Medical History:  ?Diagnosis Date  ? Arthritis   ? Chronic back pain   ? Hypertension   ? ?Past Surgical History:  ?Procedure Laterality Date  ? COLONOSCOPY N/A 04/17/2015  ? Procedure: COLONOSCOPY;  Surgeon: Danie Binder, MD;  [facility];  Service: Endoscopy;  Laterality: N/A;  1:00 PM  ? KNEE ARTHROSCOPY WITH MEDIAL MENISECTOMY Left 04/24/2017  ? Procedure: KNEE ARTHROSCOPY WITH MEDIAL MENISECTOMY;  Surgeon: Carole Civil, MD;  Location: [facility];  Service: Orthopedics;  Laterality: Left;  ? TOTAL KNEE ARTHROPLASTY Left 05/25/2021  ? Procedure: TOTAL KNEE ARTHROPLASTY;  Surgeon: Carole Civil, MD;  Location: [facility];  Service: Orthopedics;  Laterality: Left;  ? ?Patient Active Problem List  ? Diagnosis Date Noted  ? Osteoarthritis of left knee 05/25/2021  ? Compression fracture of lumbar spine, non-traumatic ([place]) 07/04/2020  ? Degenerative scoliosis 07/04/2020  ? Lumbar pain 07/04/2020  ? Medial meniscus, posterior horn derangement 05/13/2017  ? S/P left knee arthroscopy 04/24/17   ? Primary osteoarthritis of left knee   ? ? ?REFERRING DIAG: Primary osteoarthritis of left knee  ? ?THERAPY DIAG:  ?Left knee pain, unspecified chronicity ? ?Stiffness of left knee, not elsewhere classified ? ?Other abnormalities of gait and mobility ?Left knee pain, unspecified chronicity ?  ?Stiffness  of left knee, not elsewhere classified ?  ?Other abnormalities of gait and mobility ? ?PERTINENT HISTORY: LT TKA 05/25/21 ? ?PRECAUTIONS: None ? ?SUBJECTIVE: Pt reports he has been riding CPM 6 hrs a day, able to reach 100 degrees, more comfortable at 90 degree range.  Has began HEP without questions. ? ?PAIN:  ?Are you having pain? Yes: NPRS scale: 3/10 ?Pain location: Lt knee ?Pain description: stiff and achey ?Aggravating factors: bending ?Relieving factors: CPM ? ? ? ? ?OBJECTIVE:  ? ?PALPATION: ?Increased TTP diffuse about LT knee joint  ?  ?LE ROM: ?  ?Active ROM Left ?06/08/2021 Left ?06/13/21 Left ?06/19/21  ?Knee flexion 83 94 95  ?Knee extension -12 -8 -8  ?   ?LE MMT: ?  ?MMT Right ?06/08/2021 Left ?06/08/2021  ?Hip flexion 5 3+  ?Knee extension 5 3+  ?Ankle dorsiflexion 5  5  ? (Blank rows = not tested) ?  ?  ?GAIT: ?At evaluation: Distance walked: 235 feet ?Assistive device utilized: Environmental consultant - 2 wheeled ?Level of assistance: Modified independence ?Comments: Decreased LT knee and hip flexion ?  ?  ?  ?TODAY'S TREATMENT: ? ?06/19/21 ?Bike seat 21 rocking with hold end range 5 minutes ?Standing: ? Lt Knee drive 29N 10" on 98XQ step ? Heel raise 10X on incline ? Lt Knee flexion 10X ? Sidestepping on blue line 2RT ? Ambulation without AD ?Supine:  ? Quad set 10X 5"  holds ? SAQ 10X5" holds ? Heel slide 10X ? SLR 10X ?Manual:  ? Scar massage, soft tissue mobilization ?AROM: ? Lt knee 8-95  ? ?06/14/21: ?Bike seat 21 rocking with hold end range 5 minutes ?Standing: ? Lt Knee drive 16X 10" on 09UE step ? Heel raise 10X on incline ? Lt Knee flexion 10X ?Supine:  ? Quad set 10X 5" holds ? SAQ 10X5" holds ? Heel slide 10X ? SLR 10X ?Manual:  ?Retrograde massage with LE elevated ?  ? ?  ? ?06/12/21: ?Supine: Retrograde massage with LE elevated ? Quad set ? Glut set ? Heel slide 10 ? SLR ? SAQ ?Standing: ? Knee drive 5x 10" on 45WU step ? Heel raise10 ? TKE GTB 10x 5"- HEP ?Bike seat 21 rocking with hold end range ? ?06/08/21 ?Quad  set ?Glute set ?Heel slide ?SLR (partial range) ?Heel prop ?Ankle pump  ?  ?  ?PATIENT EDUCATION:  ?Education details: Reviewed goals, educated importance of HEP compliance for maximal benefits, pt able to recall and demonstrate appropriate mechanics. ?Person educated: Patient ?Education method: Explanation and Demonstration ?Education comprehension: verbalized understanding and returned demonstration ?  ?  ?HOME EXERCISE PROGRAM: ?Access Code: [email or web address] ?URL: [email or web address] ?Date: 06/08/2021 ?Prepared by: Josue Hector ?  ?Exercises ?- Supine Quad Set  - 3 x daily - 7 x weekly - 2 sets - 10 reps - 5 second hold ?- Supine Heel Slide with Strap  - 3 x daily - 7 x weekly - 2 sets - 10 reps - 5 second hold ?- Active Straight Leg Raise with Quad Set  - 3 x daily - 7 x weekly - 2 sets - 10 reps ?- Supine Ankle Pumps  - 3 x daily - 7 x weekly - 2 sets - 10 reps ?- Supine Gluteal Sets  - 3 x daily - 7 x weekly - 2 sets - 10 reps - 5 second hold ?- Supine Knee Extension Mobilization with Weight  - 3 x daily - 7 x weekly - 1 sets - 1 reps - 3-5 minutes hold ?  ?ASSESSMENT: ?  ?CLINICAL IMPRESSION: ?Continued with focus on improving Lt knee AROM.  Noted tightness/scar tissue especially promimal scar and medial knee.  Scar massage instructed and completed with noted reduction  and several "pops" of scar tissue releasing.  AROM slowly improving at 8-95 degrees following manual this session. Pt instructed to self massage or use a roller at home to help reduce this scar tissue.  Minimal edema remains perimeter of knee.  No heat or erythema. Pt reports knee always feels better after completing therapy. Pt will continue to benefit from skilled PT to work on return of ROM and strength for Lt LE.   ?  ?OBJECTIVE IMPAIRMENTS Abnormal gait, decreased activity tolerance, decreased balance, decreased mobility, difficulty walking, decreased ROM, decreased strength, hypomobility, increased edema, increased fascial  restrictions, impaired flexibility, improper body mechanics, and pain.  ?  ?ACTIVITY LIMITATIONS cleaning, community activity, driving, meal prep, occupation, laundry, yard work, shopping, and yard work.  ?  ?  ?GOALS: ?SHORT TERM GOALS: Target date: 06/29/2021 ?  ?Patient will be independent with initial HEP and self-management strategies to improve functional outcomes ?Baseline:  ?Goal status: Ongoing ?  ?LONG TERM GOALS: Target date: 07/20/2021 ?  ?Patient will be independent with advanced HEP and self-management strategies to improve functional outcomes ?Baseline:  ?Goal status: Ongoing ?  ?2.  Patient will be able to ambulate at least 400 feet during 2MWT with  LRAD to demonstrate improved ability to perform functional mobility and associated tasks. ?Baseline:  ?Goal status: Ongoing ?  ?3.  Patient will have LT knee AROM 0-120 degrees to improve functional mobility and facilitate squatting to pick up items from floor. ?Baseline:  ?Goal status:Ongoing ?  ?4. Patient will have equal to or > 4+/5 MMT throughout LLE to improve ability to perform functional mobility, stair ambulation and ADLs.  ?Baseline:  ?Goal status: Ongoing ?  ?  ?  ?PLAN: ?PT FREQUENCY: 3x/week ?  ?PT DURATION: 6 weeks ?  ?PLANNED INTERVENTIONS: Therapeutic exercises, Therapeutic activity, Neuromuscular re-education, Balance training, Gait training, Patient/Family education, and Joint mobilization ?  ?PLAN FOR NEXT SESSION: Progress knee mobility, glute and quad strength as tolerated. Manual as needed for improving ROM.  ? Teena Irani, PTA ?06/19/2021, 11:06 AM ?Teena Irani, PTA/CLT, WTA ?[phone]  ? ?  ? ?

## 2021-06-21 ENCOUNTER — Ambulatory Visit (HOSPITAL_COMMUNITY): Payer: BC Managed Care – PPO | Admitting: Physical Therapy

## 2021-06-21 DIAGNOSIS — M25662 Stiffness of left knee, not elsewhere classified: Secondary | ICD-10-CM | POA: Diagnosis not present

## 2021-06-21 DIAGNOSIS — M25562 Pain in left knee: Secondary | ICD-10-CM

## 2021-06-21 DIAGNOSIS — R2689 Other abnormalities of gait and mobility: Secondary | ICD-10-CM | POA: Diagnosis not present

## 2021-06-21 DIAGNOSIS — R29898 Other symptoms and signs involving the musculoskeletal system: Secondary | ICD-10-CM | POA: Diagnosis not present

## 2021-06-21 DIAGNOSIS — G8929 Other chronic pain: Secondary | ICD-10-CM | POA: Diagnosis not present

## 2021-06-21 DIAGNOSIS — M1712 Unilateral primary osteoarthritis, left knee: Secondary | ICD-10-CM | POA: Diagnosis not present

## 2021-06-21 DIAGNOSIS — M25512 Pain in left shoulder: Secondary | ICD-10-CM | POA: Diagnosis not present

## 2021-06-21 NOTE — Therapy (Signed)
?OUTPATIENT PHYSICAL THERAPY TREATMENT NOTE ? ? ?Patient Name: Luke Chavez ?MRN: [id number] ?DOB:1966/02/27, 56 y.o., male ?Today's Date: 06/21/2021 ? ?PCP: Sharilyn Sites, MD ?REFERRING PROVIDER: Carole Civil, MD ? ?END OF SESSION:  ? PT End of Session - 06/21/21 1459   ? ? Visit Number 5   ? Number of Visits 18   ? Date for PT Re-Evaluation 07/20/21   ? Authorization Type BCBS COMM PPO   ? PT Start Time 1448   ? PT Stop Time 1530   ? PT Time Calculation (min) 42 min   ? Activity Tolerance Patient tolerated treatment well   ? Behavior During Therapy [facility] for tasks assessed/performed   ? ?  ?  ? ?  ? ? ?Past Medical History:  ?Diagnosis Date  ? Arthritis   ? Chronic back pain   ? Hypertension   ? ?Past Surgical History:  ?Procedure Laterality Date  ? COLONOSCOPY N/A 04/17/2015  ? Procedure: COLONOSCOPY;  Surgeon: Danie Binder, MD;  [facility];  Service: Endoscopy;  Laterality: N/A;  1:00 PM  ? KNEE ARTHROSCOPY WITH MEDIAL MENISECTOMY Left 04/24/2017  ? Procedure: KNEE ARTHROSCOPY WITH MEDIAL MENISECTOMY;  Surgeon: Carole Civil, MD;  Location: [facility];  Service: Orthopedics;  Laterality: Left;  ? TOTAL KNEE ARTHROPLASTY Left 05/25/2021  ? Procedure: TOTAL KNEE ARTHROPLASTY;  Surgeon: Carole Civil, MD;  Location: [facility];  Service: Orthopedics;  Laterality: Left;  ? ?Patient Active Problem List  ? Diagnosis Date Noted  ? Osteoarthritis of left knee 05/25/2021  ? Compression fracture of lumbar spine, non-traumatic ([place]) 07/04/2020  ? Degenerative scoliosis 07/04/2020  ? Lumbar pain 07/04/2020  ? Medial meniscus, posterior horn derangement 05/13/2017  ? S/P left knee arthroscopy 04/24/17   ? Primary osteoarthritis of left knee   ? ? ?REFERRING DIAG: Primary osteoarthritis of left knee  ? ?THERAPY DIAG:  ?Left knee pain, unspecified chronicity ? ?Stiffness of left knee, not elsewhere classified ? ?Other abnormalities of gait and mobility ?Left knee pain, unspecified chronicity ?  ?Stiffness  of left knee, not elsewhere classified ?  ?Other abnormalities of gait and mobility ? ?PERTINENT HISTORY: LT TKA 05/25/21 ? ?PRECAUTIONS: None ? ?SUBJECTIVE: Pt reports this is the last week on his CPM.  States his pain level is about the same around 3/10. Mostly stiffness.  Continues to use SPC with ambulation. ? ?PAIN:  ?Are you having pain? Yes: NPRS scale: 3/10 ?Pain location: Lt knee ?Pain description: stiff and achey ?Aggravating factors: bending ?Relieving factors: CPM ? ? ? ? ?OBJECTIVE:  ? ?PALPATION: ?Increased TTP diffuse about LT knee joint  ?  ?LE ROM: ?  ?Active ROM Left ?06/08/2021 Left ?06/13/21 Left ?06/19/21 Left ?06/21/21 ?  ?Knee flexion 83 94 95 96  ?Knee extension -12 -8 -8 -6  ?   ?LE MMT: ?  ?MMT Right ?06/08/2021 Left ?06/08/2021  ?Hip flexion 5 3+  ?Knee extension 5 3+  ?Ankle dorsiflexion 5  5  ? (Blank rows = not tested) ?  ?  ?GAIT: ?At evaluation: Distance walked: 235 feet ?Assistive device utilized: Environmental consultant - 2 wheeled ?Level of assistance: Modified independence ?Comments: Decreased LT knee and hip flexion ?  ?  ?  ?TODAY'S TREATMENT: ?06/21/21 ?Bike seat 21 rocking with hold end range 5 minutes ?Standing: ? Lt Knee drive 21J 10" on 94RD step ? Hamstring stretch on 12" step 3X30" holds  ? Heel raise 10X on incline ? Lt Knee flexion 10X with 4"  step prop for TKF ? Sidestepping on blue line 2RT ? Ambulation without AD ?Supine:  ? Quad set 10X 5" holds ? SAQ 10X5" holds ? Heel slide 10X ? SLR 10X ?Prone:  Knee hang with massage ?Manual: Scar massage, soft tissue mobilization ? Anterior in supine, posterior knee in prone ?AROM: Lt knee -6-96  ? ?06/19/21 ?Bike seat 21 rocking with hold end range 5 minutes ?Standing: ? Lt Knee drive 41O 10" on 87OM step ? Heel raise 10X on incline ? Lt Knee flexion 10X ? Sidestepping on blue line 2RT ? Ambulation without AD ?Supine:  ? Quad set 10X 5" holds ? SAQ 10X5" holds ? Heel slide 10X ? SLR 10X ?Manual:  ? Scar massage, soft tissue mobilization ?AROM: ? Lt knee  8-95  ? ?06/14/21: ?Bike seat 21 rocking with hold end range 5 minutes ?Standing: ? Lt Knee drive 76H 10" on 20NO step ? Heel raise 10X on incline ? Lt Knee flexion 10X ?Supine:  ? Quad set 10X 5" holds ? SAQ 10X5" holds ? Heel slide 10X ? SLR 10X ?Manual:  ?Retrograde massage with LE elevated ?   ? ?  ?  ?PATIENT EDUCATION:  ?Education details: Reviewed goals, educated importance of HEP compliance for maximal benefits, pt able to recall and demonstrate appropriate mechanics. ?Person educated: Patient ?Education method: Explanation and Demonstration ?Education comprehension: verbalized understanding and returned demonstration ?  ?  ?HOME EXERCISE PROGRAM: ?Access Code: [email or web address] ?Date: 06/08/2021 supine QS, HS with strap, SLR with QS, ankle pumps, GS, knee extension mobilization with weight ?  ?ASSESSMENT: ?  ?CLINICAL IMPRESSION: ?Continued with focus on improving Lt knee AROM.  Prone knee hang added this session with manual completed as well as gentle contract relax for extension. Pt still unable to make a full revolution on bike with seat on 21. Less tightness palpated with manual today along scar line and perimeter.  AROM slowly improving at -6-96 degrees following manual this session. Pt instructed to continue self massage and push his ROM at home. Worked on heel to toe ambulation at end with focus on bending the knee to follow through.Pt will continue to benefit from skilled PT to work on return of ROM and strength for Lt LE.   ?  ?OBJECTIVE IMPAIRMENTS Abnormal gait, decreased activity tolerance, decreased balance, decreased mobility, difficulty walking, decreased ROM, decreased strength, hypomobility, increased edema, increased fascial restrictions, impaired flexibility, improper body mechanics, and pain.  ?  ?ACTIVITY LIMITATIONS cleaning, community activity, driving, meal prep, occupation, laundry, yard work, shopping, and yard work.  ?  ?  ?GOALS: ?SHORT TERM GOALS: Target date: 06/29/2021 ?  ?Patient will  be independent with initial HEP and self-management strategies to improve functional outcomes ?Baseline:  ?Goal status: Achieved ?  ?LONG TERM GOALS: Target date: 07/20/2021 ?  ?Patient will be independent with advanced HEP and self-management strategies to improve functional outcomes ?Baseline:  ?Goal status: Ongoing ?  ?2.  Patient will be able to ambulate at least 400 feet during 2MWT with LRAD to demonstrate improved ability to perform functional mobility and associated tasks. ?Baseline:  ?Goal status: Ongoing ?  ?3.  Patient will have LT knee AROM 0-120 degrees to improve functional mobility and facilitate squatting to pick up items from floor. ?Baseline:  ?Goal status:Ongoing ?  ?4. Patient will have equal to or > 4+/5 MMT throughout LLE to improve ability to perform functional mobility, stair ambulation and ADLs.  ?Baseline:  ?Goal status: Ongoing ?  ?  ?  ?  PLAN: ?PT FREQUENCY: 3x/week ?  ?PT DURATION: 6 weeks ?  ?PLANNED INTERVENTIONS: Therapeutic exercises, Therapeutic activity, Neuromuscular re-education, Balance training, Gait training, Patient/Family education, and Joint mobilization ?  ?PLAN FOR NEXT SESSION: Progress knee mobility, glute and quad strength as tolerated. Manual as needed for improving ROM.  ? Teena Irani, PTA ?06/21/2021, 3:00 PM ?Teena Irani, PTA/CLT, WTA ?[phone]  ? ?  ? ?

## 2021-06-22 ENCOUNTER — Ambulatory Visit (HOSPITAL_COMMUNITY): Payer: BC Managed Care – PPO | Admitting: Physical Therapy

## 2021-06-22 DIAGNOSIS — M25662 Stiffness of left knee, not elsewhere classified: Secondary | ICD-10-CM

## 2021-06-22 DIAGNOSIS — M25562 Pain in left knee: Secondary | ICD-10-CM | POA: Diagnosis not present

## 2021-06-22 DIAGNOSIS — R2689 Other abnormalities of gait and mobility: Secondary | ICD-10-CM | POA: Diagnosis not present

## 2021-06-22 DIAGNOSIS — M1712 Unilateral primary osteoarthritis, left knee: Secondary | ICD-10-CM | POA: Diagnosis not present

## 2021-06-22 DIAGNOSIS — M25512 Pain in left shoulder: Secondary | ICD-10-CM | POA: Diagnosis not present

## 2021-06-22 DIAGNOSIS — R29898 Other symptoms and signs involving the musculoskeletal system: Secondary | ICD-10-CM | POA: Diagnosis not present

## 2021-06-22 DIAGNOSIS — G8929 Other chronic pain: Secondary | ICD-10-CM | POA: Diagnosis not present

## 2021-06-22 NOTE — Therapy (Signed)
?OUTPATIENT PHYSICAL THERAPY TREATMENT NOTE ? ? ?Patient Name: Luke Chavez ?MRN: [id number] ?DOB:09-15-1965, 56 y.o., male ?Today's Date: 06/22/2021 ? ?PCP: Sharilyn Sites, MD ?REFERRING PROVIDER: Carole Civil, MD ? ?END OF SESSION:  ? PT End of Session - 06/22/21 1447   ? ? Visit Number 6   ? Number of Visits 18   ? Date for PT Re-Evaluation 07/20/21   ? Authorization Type BCBS COMM PPO   ? PT Start Time 1448   ? PT Stop Time 1530   ? PT Time Calculation (min) 42 min   ? Activity Tolerance Patient tolerated treatment well   ? Behavior During Therapy [facility] for tasks assessed/performed   ? ?  ?  ? ?  ? ? ?Past Medical History:  ?Diagnosis Date  ? Arthritis   ? Chronic back pain   ? Hypertension   ? ?Past Surgical History:  ?Procedure Laterality Date  ? COLONOSCOPY N/A 04/17/2015  ? Procedure: COLONOSCOPY;  Surgeon: Danie Binder, MD;  [facility];  Service: Endoscopy;  Laterality: N/A;  1:00 PM  ? KNEE ARTHROSCOPY WITH MEDIAL MENISECTOMY Left 04/24/2017  ? Procedure: KNEE ARTHROSCOPY WITH MEDIAL MENISECTOMY;  Surgeon: Carole Civil, MD;  Location: [facility];  Service: Orthopedics;  Laterality: Left;  ? TOTAL KNEE ARTHROPLASTY Left 05/25/2021  ? Procedure: TOTAL KNEE ARTHROPLASTY;  Surgeon: Carole Civil, MD;  Location: [facility];  Service: Orthopedics;  Laterality: Left;  ? ?Patient Active Problem List  ? Diagnosis Date Noted  ? Osteoarthritis of left knee 05/25/2021  ? Compression fracture of lumbar spine, non-traumatic ([place]) 07/04/2020  ? Degenerative scoliosis 07/04/2020  ? Lumbar pain 07/04/2020  ? Medial meniscus, posterior horn derangement 05/13/2017  ? S/P left knee arthroscopy 04/24/17   ? Primary osteoarthritis of left knee   ? ? ?REFERRING DIAG: Primary osteoarthritis of left knee  ? ?THERAPY DIAG:  ?Left knee pain, unspecified chronicity ? ?Stiffness of left knee, not elsewhere classified ? ?Other abnormalities of gait and mobility ?Left knee pain, unspecified chronicity ?  ?Stiffness  of left knee, not elsewhere classified ?  ?Other abnormalities of gait and mobility ? ?PERTINENT HISTORY: LT TKA 05/25/21 ? ?PRECAUTIONS: None ? ?SUBJECTIVE:  States his pain level is about  5/10. Mostly stiffness.  Continues to ice  ? ?PAIN:  ?Are you having pain? Yes: NPRS scale: 5/10 ?Pain location: Lt knee ?Pain description: stiff and achey ?Aggravating factors: bending ?Relieving factors: CPM ? ? ? ? ?OBJECTIVE:  ? ? ?  ?LE ROM: ?  ?Active ROM Left ?06/08/2021 Left ?06/13/21 Left ?06/19/21 Left ?06/21/21 ? Left   ?Knee flexion 83 94 95 96 98  ?Knee extension -12 -8 -8 -6  -8  ?   ?LE MMT: ?  ?MMT Right ?06/08/2021 Left ?06/08/2021  ?Hip flexion 5 3+  ?Knee extension 5 3+  ?Ankle dorsiflexion 5  5  ? (Blank rows = not tested) ?  ?  ?GAIT: ?At evaluation: Distance walked: 235 feet ?Assistive device utilized: Environmental consultant - 2 wheeled ?Level of assistance: Modified independence ?Comments: Decreased LT knee and hip flexion ?  ?  ?  ?TODAY'S TREATMENT: ?            4/21/203 ?               Bike seat 21 rocking with hold end range 5 seconds ?Standing: ? Lt Knee drive 3x 30" on 02OV step ? Hamstring stretch on 12" step 3X30" holds  ?  Terminal knee extension x 10 ? Heel raise 10X on incline ? Lt Knee flexion 10X ?            Slant board stretch 3 x 30 ?            Rocker board x 2' ?Prone:  quad stretch 3 x 30" ?             Terminal extension x 10   ?             Knee flexion x 10 ?Supine:  SAQ x 10 ?              Qset x 5  ?              Active hamstring stretch 3x 30" ?              Heelslide x 5  ? ?06/21/21 ?Bike seat 21 rocking with hold end range 5 minutes ?Standing: ? Lt Knee drive 40J 10" on 81XB step ? Hamstring stretch on 12" step 3X30" holds  ? Heel raise 10X on incline ? Lt Knee flexion 10X with 4" step prop for TKF ? Sidestepping on blue line 2RT ? Ambulation without AD ?Supine:  ? Quad set 10X 5" holds ? SAQ 10X5" holds ? Heel slide 10X ? SLR 10X ?Prone:  Knee hang with massage ?Manual: Scar massage, soft  tissue mobilization ? Anterior in supine, posterior knee in prone ?AROM: Lt knee -6-96  ? ?06/19/21 ?Bike seat 21 rocking with hold end range 5 minutes ?Standing: ? Lt Knee drive 14N 10" on 82NF step ? Heel raise 10X on incline ? Lt Knee flexion 10X ? Sidestepping on blue line 2RT ? Ambulation without AD ?Supine:  ? Quad set 10X 5" holds ? SAQ 10X5" holds ? Heel slide 10X ? SLR 10X ?Manual:  ? Scar massage, soft tissue mobilization ?AROM: ? Lt knee 8-95  ? ?06/14/21: ?Bike seat 21 rocking with hold end range 5 minutes ?Standing: ? Lt Knee drive 62Z 10" on 30QM step ? Heel raise 10X on incline ? Lt Knee flexion 10X ?Supine:  ? Quad set 10X 5" holds ? SAQ 10X5" holds ? Heel slide 10X ? SLR 10X ?Manual:  ?Retrograde massage with LE elevated ?   ? ?  ?  ?PATIENT EDUCATION:  ?Education details: Reviewed goals, educated importance of HEP compliance for maximal benefits, pt able to recall and demonstrate appropriate mechanics. ?Person educated: Patient ?Education method: Explanation and Demonstration ?Education comprehension: verbalized understanding and returned demonstration ?  ?  ?HOME EXERCISE PROGRAM: ?Access Code: [email or web address] ?Date: 06/08/2021 supine QS, HS with strap, SLR with QS, ankle pumps, GS, knee extension mobilization with weight ?  ?ASSESSMENT: ?  ?CLINICAL IMPRESSION: ?Continued with focus on improving Lt knee AROM.  Added prone quad stretch, slant board stretch and active hamstring stretch   ?OBJECTIVE IMPAIRMENTS Abnormal gait, decreased activity tolerance, decreased balance, decreased mobility, difficulty walking, decreased ROM, decreased strength, hypomobility, increased edema, increased fascial restrictions, impaired flexibility, improper body mechanics, and pain.  ?  ?ACTIVITY LIMITATIONS cleaning, community activity, driving, meal prep, occupation, laundry, yard work, shopping, and yard work.  ?  ?  ?GOALS: ?SHORT TERM GOALS: Target date: 06/29/2021 ?  ?Patient will be independent with initial HEP and  self-management strategies to improve functional outcomes ?Baseline:  ?Goal status: Achieved ?  ?LONG TERM GOALS: Target date: 07/20/2021 ?  ?Patient will be independent with advanced HEP and self-management strategies to improve functional  outcomes ?Baseline:  ?Goal status: Ongoing ?  ?2.  Patient will be able to ambulate at least 400 feet during 2MWT with LRAD to demonstrate improved ability to perform functional mobility and associated tasks. ?Baseline:  ?Goal status: Ongoing ?  ?3.  Patient will have LT knee AROM 0-120 degrees to improve functional mobility and facilitate squatting to pick up items from floor. ?Baseline:  ?Goal status:Ongoing ?  ?4. Patient will have equal to or > 4+/5 MMT throughout LLE to improve ability to perform functional mobility, stair ambulation and ADLs.  ?Baseline:  ?Goal status: Ongoing ?  ?  ?  ?PLAN: ?PT FREQUENCY: 3x/week ?  ?PT DURATION: 6 weeks ?  ?PLANNED INTERVENTIONS: Therapeutic exercises, Therapeutic activity, Neuromuscular re-education, Balance training, Gait training, Patient/Family education, and Joint mobilization ?  ?PLAN FOR NEXT SESSION: Progress knee mobility, glute and quad strength as tolerated. Manual as needed for improving ROM.  ? ?Raysean Graumann,CINDY, PT ?06/22/2021, 2:51 PM ?Teena Irani, PTA/CLT, WTA ?[phone]  ? ?  ? ?

## 2021-06-25 ENCOUNTER — Ambulatory Visit (INDEPENDENT_AMBULATORY_CARE_PROVIDER_SITE_OTHER): Payer: BC Managed Care – PPO | Admitting: Orthopedic Surgery

## 2021-06-25 ENCOUNTER — Ambulatory Visit (HOSPITAL_COMMUNITY): Payer: BC Managed Care – PPO

## 2021-06-25 DIAGNOSIS — M25662 Stiffness of left knee, not elsewhere classified: Secondary | ICD-10-CM

## 2021-06-25 DIAGNOSIS — M25562 Pain in left knee: Secondary | ICD-10-CM | POA: Diagnosis not present

## 2021-06-25 DIAGNOSIS — R2689 Other abnormalities of gait and mobility: Secondary | ICD-10-CM | POA: Diagnosis not present

## 2021-06-25 DIAGNOSIS — M25512 Pain in left shoulder: Secondary | ICD-10-CM | POA: Diagnosis not present

## 2021-06-25 DIAGNOSIS — G8929 Other chronic pain: Secondary | ICD-10-CM | POA: Diagnosis not present

## 2021-06-25 DIAGNOSIS — R29898 Other symptoms and signs involving the musculoskeletal system: Secondary | ICD-10-CM

## 2021-06-25 DIAGNOSIS — Z96652 Presence of left artificial knee joint: Secondary | ICD-10-CM

## 2021-06-25 DIAGNOSIS — M1712 Unilateral primary osteoarthritis, left knee: Secondary | ICD-10-CM | POA: Diagnosis not present

## 2021-06-25 NOTE — Therapy (Signed)
OUTPATIENT PHYSICAL THERAPY TREATMENT NOTE   Patient Name: Luke Chavez MRN: [id number] DOB:[birth year], 56 y.o., male Today's Date: 06/25/2021  PCP: Assunta Found, MD REFERRING PROVIDER: Vickki Hearing, MD  END OF SESSION:   PT End of Session - 06/25/21 1303     Visit Number 7    Number of Visits 18    Date for PT Re-Evaluation 07/20/21    Authorization Type BCBS COMM PPO    PT Start Time 1300    PT Stop Time 1346    PT Time Calculation (min) 46 min    Activity Tolerance Patient tolerated treatment well    Behavior During Therapy [facility] for tasks assessed/performed              Past Medical History:  Diagnosis Date   Arthritis    Chronic back pain    Hypertension    Past Surgical History:  Procedure Laterality Date   COLONOSCOPY N/A 04/17/2015   Procedure: COLONOSCOPY;  Surgeon: West Bali, MD;  [facility];  Service: Endoscopy;  Laterality: N/A;  1:00 PM   KNEE ARTHROSCOPY WITH MEDIAL MENISECTOMY Left 04/24/2017   Procedure: KNEE ARTHROSCOPY WITH MEDIAL MENISECTOMY;  Surgeon: Vickki Hearing, MD;  Location: [facility];  Service: Orthopedics;  Laterality: Left;   TOTAL KNEE ARTHROPLASTY Left 05/25/2021   Procedure: TOTAL KNEE ARTHROPLASTY;  Surgeon: Vickki Hearing, MD;  Location: [facility];  Service: Orthopedics;  Laterality: Left;   Patient Active Problem List   Diagnosis Date Noted   Osteoarthritis of left knee 05/25/2021   Compression fracture of lumbar spine, non-traumatic ([facility]) 07/04/2020   Degenerative scoliosis 07/04/2020   Lumbar pain 07/04/2020   Medial meniscus, posterior horn derangement 05/13/2017   S/P left knee arthroscopy 04/24/17    Primary osteoarthritis of left knee     REFERRING DIAG: Primary osteoarthritis of left knee   THERAPY DIAG:  Other symptoms and signs involving the musculoskeletal system  Other abnormalities of gait and mobility  Stiffness of left knee, not elsewhere classified  Chronic left shoulder  pain  Left knee pain, unspecified chronicity Left knee pain, unspecified chronicity   Stiffness of left knee, not elsewhere classified   Other abnormalities of gait and mobility  PERTINENT HISTORY: LT TKA 05/25/21  PRECAUTIONS: None  SUBJECTIVE:  Patient reports left knee stiffness continues; no pain at rest but pain with end range bending.   PAIN:  Are you having pain? Yes: NPRS scale: 4/10 Pain location: Lt knee Pain description: stiff and achey Aggravating factors: bending Relieving factors: CPM     OBJECTIVE:      LE ROM:   Active ROM Left 06/08/2021 Left 06/13/21 Left 06/19/21 Left 06/21/21  Left  Left knee AAROM sitting and supine 06/25/21  Knee flexion 83 94 95 96 98 105  Knee extension -12 -8 -8 -6  -8 -7     LE MMT:   MMT Right 06/08/2021 Left 06/08/2021  Hip flexion 5 3+  Knee extension 5 3+  Ankle dorsiflexion 5  5   (Blank rows = not tested)     GAIT: At evaluation: Distance walked: 235 feet Assistive device utilized: Environmental consultant - 2 wheeled Level of assistance: Modified independence Comments: Decreased LT knee and hip flexion       TODAY'S TREATMENT:  06/25/21 Bike seat 23 x 10' (patient able to make full revolution although with hip substitution)  Standing: Knee drives for knee flexion on 12' box x 15 reps  Seated on mat  manual knee flexion x 5 ; contract relax x 5; manual knee ext in supine; all manual interventions performed independently of other interventions.   Seated and supine knee flexion today AAROM 105 degrees Supine knee ext -7 today AAROM                      4/21/203                Bike seat 21 rocking with hold end range 5 seconds Standing:  Lt Knee drive 3x 30" on 64QI step  Hamstring stretch on 12" step 3X30" holds              Terminal knee extension x 10  Heel raise 10X on incline  Lt Knee flexion 10X             Slant board stretch 3 x 30             Rocker board x 2' Prone:  quad stretch 3 x 30"               Terminal extension x 10                Knee flexion x 10 Supine:  SAQ x 10               Qset x 5                Active hamstring stretch 3x 30"               Heelslide x 5   06/21/21 Bike seat 21 rocking with hold end range 5 minutes Standing:  Lt Knee drive 34V 10" on 42VZ step  Hamstring stretch on 12" step 3X30" holds   Heel raise 10X on incline  Lt Knee flexion 10X with 4" step prop for TKF  Sidestepping on blue line 2RT  Ambulation without AD Supine:   Quad set 10X 5" holds  SAQ 10X5" holds  Heel slide 10X  SLR 10X Prone:  Knee hang with massage Manual: Scar massage, soft tissue mobilization  Anterior in supine, posterior knee in prone AROM: Lt knee -6-96   06/19/21 Bike seat 21 rocking with hold end range 5 minutes Standing:  Lt Knee drive 56L 10" on 87FI step  Heel raise 10X on incline  Lt Knee flexion 10X  Sidestepping on blue line 2RT  Ambulation without AD Supine:   Quad set 10X 5" holds  SAQ 10X5" holds  Heel slide 10X  SLR 10X Manual:   Scar massage, soft tissue mobilization AROM:  Lt knee 8-95   06/14/21: Bike seat 21 rocking with hold end range 5 minutes Standing:  Lt Knee drive 43P 10" on 29JJ step  Heel raise 10X on incline  Lt Knee flexion 10X Supine:   Quad set 10X 5" holds  SAQ 10X5" holds  Heel slide 10X  SLR 10X Manual:  Retrograde massage with LE elevated         PATIENT EDUCATION:  Education details: Reviewed goals, educated importance of HEP compliance for maximal benefits, pt able to recall and demonstrate appropriate mechanics. Person educated: Patient Education method: Medical illustrator Education comprehension: verbalized understanding and returned demonstration     HOME EXERCISE PROGRAM: Access Code: [email or web address] Date: 06/08/2021 supine QS, HS with strap, SLR with QS, ankle pumps, GS, knee extension mobilization with weight   ASSESSMENT:   CLINICAL IMPRESSION: Today's session continued to focus on improving  left knee  mobility and strength.  Patient painful with both knee flexion and extension but works hard in therapy. Therapist did discuss pain management with him; use of AD; ice, medication as prescribed.  Patient with noticeable Left decreased toe off but improves with verbal cues. 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 Patient will benefit from continued skilled therapy interventions to address deficits and improve functional mobility.     OBJECTIVE IMPAIRMENTS Abnormal gait, decreased activity tolerance, decreased balance, decreased mobility, difficulty walking, decreased ROM, decreased strength, hypomobility, increased edema, increased fascial restrictions, impaired flexibility, improper body mechanics, and pain.    ACTIVITY LIMITATIONS cleaning, community activity, driving, meal prep, occupation, laundry, yard work, shopping, and yard work.      GOALS: SHORT TERM GOALS: Target date: 06/29/2021   Patient will be independent with initial HEP and self-management strategies to improve functional outcomes Baseline:  Goal status: Achieved   LONG TERM GOALS: Target date: 07/20/2021   Patient will be independent with advanced HEP and self-management strategies to improve functional outcomes Baseline:  Goal status: Ongoing   2.  Patient will be able to ambulate at least 400 feet during with LRAD to demonstrate improved ability to perform functional mobility and associated tasks. Baseline:  Goal status: Ongoing   3.  Patient will have LT knee AROM 0-120 degrees to improve functional mobility and facilitate squatting to pick up items from floor. Baseline: 06/25/21 supine AAROM KE -7, KF 105 Goal status:Ongoing   4. Patient will have equal to or > 4+/5 MMT throughout LLE to improve ability to perform functional mobility, stair  ambulation and ADLs.  Baseline:  Goal status: Ongoing       PLAN: PT FREQUENCY: 3x/week   PT DURATION: 6 weeks   PLANNED INTERVENTIONS: Therapeutic exercises, Therapeutic activity, Neuromuscular re-education, Balance training, Gait training, Patient/Family education, and Joint mobilization   PLAN FOR NEXT SESSION: Progress knee mobility, glute and quad strength as tolerated. Manual as needed for improving ROM. He sees his surgeon this afternoon. Recommend patient to continue for 3 x week x 3 more weeks per initial evaluation certification is good until 07/20/21  2:04 PM, 06/25/21 Carrisa Keller Small Maleeka Sabatino MPT [facility] [place] [phone] Ph:[phone]

## 2021-06-25 NOTE — Patient Instructions (Signed)
Follow up in six weeks s/p TKA left  ?

## 2021-06-25 NOTE — Progress Notes (Signed)
FOLLOW UP  ? ?Encounter Diagnosis  ?Name Primary?  ? Status post total left knee replacement Yes  ? ? ? ?Chief Complaint  ?Patient presents with  ? Post-op Follow-up  ?  LEFT TKA DOS 05/25/21  ? ? ? ?Luke Chavez continues to struggle with his extension I would say its about 10 degrees it is flexible down to about 5 degrees he needs to work on that.  He said his range of motion got up to 105 today with some work so he still needs to work on that I think he can get there ? ?Follow-up in 6 weeks ?

## 2021-06-27 ENCOUNTER — Ambulatory Visit (HOSPITAL_COMMUNITY): Payer: BC Managed Care – PPO

## 2021-06-27 DIAGNOSIS — G8929 Other chronic pain: Secondary | ICD-10-CM | POA: Diagnosis not present

## 2021-06-27 DIAGNOSIS — R29898 Other symptoms and signs involving the musculoskeletal system: Secondary | ICD-10-CM

## 2021-06-27 DIAGNOSIS — M25512 Pain in left shoulder: Secondary | ICD-10-CM | POA: Diagnosis not present

## 2021-06-27 DIAGNOSIS — M1712 Unilateral primary osteoarthritis, left knee: Secondary | ICD-10-CM | POA: Diagnosis not present

## 2021-06-27 DIAGNOSIS — M25662 Stiffness of left knee, not elsewhere classified: Secondary | ICD-10-CM | POA: Diagnosis not present

## 2021-06-27 DIAGNOSIS — R2689 Other abnormalities of gait and mobility: Secondary | ICD-10-CM

## 2021-06-27 DIAGNOSIS — M25562 Pain in left knee: Secondary | ICD-10-CM | POA: Diagnosis not present

## 2021-06-27 NOTE — Therapy (Signed)
?OUTPATIENT PHYSICAL THERAPY TREATMENT NOTE ? ? ?Patient Name: Luke Chavez ?MRN: [id number] ?DOB:02-10-1966, 56 y.o., male ?Today's Date: 06/27/2021 ? ?PCP: Sharilyn Sites, MD ?REFERRING PROVIDER: Carole Civil, MD ? ?END OF SESSION:  ? PT End of Session - 06/27/21 1313   ? ? Visit Number 8   ? Number of Visits 18   ? Date for PT Re-Evaluation 07/20/21   ? Authorization Type BCBS COMM PPO   ? PT Start Time 5885   ? PT Stop Time 0277   ? PT Time Calculation (min) 40 min   ? Activity Tolerance Patient tolerated treatment well   ? Behavior During Therapy [facility] for tasks assessed/performed   ? ?  ?  ? ?  ? ? ? ? ?Past Medical History:  ?Diagnosis Date  ? Arthritis   ? Chronic back pain   ? Hypertension   ? ?Past Surgical History:  ?Procedure Laterality Date  ? COLONOSCOPY N/A 04/17/2015  ? Procedure: COLONOSCOPY;  Surgeon: Danie Binder, MD;  [facility];  Service: Endoscopy;  Laterality: N/A;  1:00 PM  ? KNEE ARTHROSCOPY WITH MEDIAL MENISECTOMY Left 04/24/2017  ? Procedure: KNEE ARTHROSCOPY WITH MEDIAL MENISECTOMY;  Surgeon: Carole Civil, MD;  Location: [facility];  Service: Orthopedics;  Laterality: Left;  ? TOTAL KNEE ARTHROPLASTY Left 05/25/2021  ? Procedure: TOTAL KNEE ARTHROPLASTY;  Surgeon: Carole Civil, MD;  Location: [facility];  Service: Orthopedics;  Laterality: Left;  ? ?Patient Active Problem List  ? Diagnosis Date Noted  ? Osteoarthritis of left knee 05/25/2021  ? Compression fracture of lumbar spine, non-traumatic ([place]) 07/04/2020  ? Degenerative scoliosis 07/04/2020  ? Lumbar pain 07/04/2020  ? Medial meniscus, posterior horn derangement 05/13/2017  ? S/P left knee arthroscopy 04/24/17   ? Primary osteoarthritis of left knee   ? ? ?REFERRING DIAG: Primary osteoarthritis of left knee  ? ?THERAPY DIAG:  ?Other symptoms and signs involving the musculoskeletal system ? ?Left knee pain, unspecified chronicity ? ?Other abnormalities of gait and mobility ? ?Stiffness of left knee, not  elsewhere classified ? ?Chronic left shoulder pain ?Left knee pain, unspecified chronicity ?  ?Stiffness of left knee, not elsewhere classified ?  ?Other abnormalities of gait and mobility ? ?PERTINENT HISTORY: LT TKA 05/25/21 ? ?PRECAUTIONS: None ? ?SUBJECTIVE:  Patient reports MD appt went well; no pain at rest but tenderness with bending the knee. He notices at home some activities are getting a little easier; getting up and down better.  ? ?PAIN:  ?Are you having pain? Yes: NPRS scale: 4/10 ?Pain location: Lt knee ?Pain description: stiff and achey ?Aggravating factors: bending ?Relieving factors: CPM ? ? ? ? ?OBJECTIVE:  ? ? ?  ?LE ROM: ?  ?Active ROM Left ?06/08/2021 Left ?06/13/21 Left ?06/19/21 Left ?06/21/21 ? Left  Left knee AAROM sitting and supine ?06/25/21  ?Knee flexion 83 94 95 96 98 105  ?Knee extension -12 -8 -8 -6  -8 -7  ?   ?LE MMT: ?  ?MMT Right ?06/08/2021 Left ?06/08/2021  ?Hip flexion 5 3+  ?Knee extension 5 3+  ?Ankle dorsiflexion 5  5  ? (Blank rows = not tested) ?  ?  ?GAIT: ?At evaluation: Distance walked: 235 feet ?Assistive device utilized: Environmental consultant - 2 wheeled ?Level of assistance: Modified independence ?Comments: Decreased LT knee and hip flexion ?  ?  ?  ?TODAY'S TREATMENT: ? 06/26/21 ?Bike seat 22 x 15' (patient able to make full revolution although with hip substitution) ?  Knee drives on 12" box x 2' ?H/S stretch 5 x 20" ?Sitting on Step buttocks drop for knee flexion x 10 ?Seated on mat manual knee flexion x 5 ; contract relax x 5; manual knee ext in supine; all manual interventions performed independently of other interventions.  ? ? ? 06/25/21 ?Bike seat 23 x 10' (patient able to make full revolution although with hip substitution) ? ?Standing: ?Knee drives for knee flexion on 12' box x 15 reps ? ?Seated on mat manual knee flexion x 5 ; contract relax x 5; manual knee ext in supine; all manual interventions performed independently of other interventions.  ? ?Seated and supine knee flexion today  AAROM 105 degrees ?Supine knee ext -7 today AAROM ? ? ? ? ? ?  ? ? ?            4/21/203 ?               Bike seat 21 rocking with hold end range 5 seconds ?Standing: ? Lt Knee drive 3x 30" on 96GE step ? Hamstring stretch on 12" step 3X30" holds  ?            Terminal knee extension x 10 ? Heel raise 10X on incline ? Lt Knee flexion 10X ?            Slant board stretch 3 x 30 ?            Rocker board x 2' ?Prone:  quad stretch 3 x 30" ?             Terminal extension x 10   ?             Knee flexion x 10 ?Supine:  SAQ x 10 ?              Qset x 5  ?              Active hamstring stretch 3x 30" ?              Heelslide x 5  ? ? ?  ?  ?PATIENT EDUCATION:  ?Education details: Reviewed goals, educated importance of HEP compliance for maximal benefits, pt able to recall and demonstrate appropriate mechanics. ?Person educated: Patient ?Education method: Explanation and Demonstration ?Education comprehension: verbalized understanding and returned demonstration ?  ?  ?HOME EXERCISE PROGRAM: ?Access Code: [email or web address] ?Date: 06/08/2021 supine QS, HS with strap, SLR with QS, ankle pumps, GS, knee extension mobilization with weight ?  ?ASSESSMENT: ?  ?CLINICAL IMPRESSION: ?Today's session continued to focus on improving left knee mobility and strength.  Added step stretch to improve Left knee flexion.  Patient continues with pain and tightness at available end ranges but improved supine AAROM Left knee flexion to 108 degrees today. Patient will benefit from continued skilled therapy interventions to address deficits and improve functional mobility.  ? ? ? ?OBJECTIVE IMPAIRMENTS Abnormal gait, decreased activity tolerance, decreased balance, decreased mobility, difficulty walking, decreased ROM, decreased strength, hypomobility, increased edema, increased fascial restrictions, impaired flexibility, improper body mechanics, and pain.  ?  ?ACTIVITY LIMITATIONS cleaning, community activity, driving, meal prep, occupation, laundry,  yard work, shopping, and yard work.  ?  ?  ?GOALS: ?SHORT TERM GOALS: Target date: 06/29/2021 ?  ?Patient will be independent with initial HEP and self-management strategies to improve functional outcomes ?Baseline:  ?Goal status: Achieved ?  ?LONG TERM GOALS: Target date: 07/20/2021 ?  ?Patient will be independent with advanced HEP and self-management  strategies to improve functional outcomes ?Baseline:  ?Goal status: Ongoing ?  ?2.  Patient will be able to ambulate at least 400 feet during 2MWT with LRAD to demonstrate improved ability to perform functional mobility and associated tasks. ?Baseline:  ?Goal status: Ongoing ?  ?3.  Patient will have LT knee AROM 0-120 degrees to improve functional mobility and facilitate squatting to pick up items from floor. ?Baseline: 06/25/21 supine AAROM KE -7, KF 105 ?Goal status:Ongoing ?  ?4. Patient will have equal to or > 4+/5 MMT throughout LLE to improve ability to perform functional mobility, stair ambulation and ADLs.  ?Baseline:  ?Goal status: Ongoing ?  ?  ?  ?PLAN: ?PT FREQUENCY: 3x/week ?  ?PT DURATION: 6 weeks ?  ?PLANNED INTERVENTIONS: Therapeutic exercises, Therapeutic activity, Neuromuscular re-education, Balance training, Gait training, Patient/Family education, and Joint mobilization ?  ?PLAN FOR NEXT SESSION: Progress knee mobility, glute and quad strength as tolerated. Manual as needed for improving ROM. ? ? 1:50 PM, 06/27/21 ?Ernst Cumpston Small Amadi Yoshino MPT ?[facility] ?[place] [phone] ?Ph:[phone] ? ? ?

## 2021-07-02 ENCOUNTER — Encounter (HOSPITAL_COMMUNITY): Payer: Self-pay | Admitting: Physical Therapy

## 2021-07-02 ENCOUNTER — Ambulatory Visit (HOSPITAL_COMMUNITY): Payer: BC Managed Care – PPO | Attending: Orthopedic Surgery | Admitting: Physical Therapy

## 2021-07-02 DIAGNOSIS — M25562 Pain in left knee: Secondary | ICD-10-CM | POA: Insufficient documentation

## 2021-07-02 DIAGNOSIS — G8929 Other chronic pain: Secondary | ICD-10-CM | POA: Insufficient documentation

## 2021-07-02 DIAGNOSIS — M25512 Pain in left shoulder: Secondary | ICD-10-CM | POA: Insufficient documentation

## 2021-07-02 DIAGNOSIS — R2689 Other abnormalities of gait and mobility: Secondary | ICD-10-CM | POA: Diagnosis not present

## 2021-07-02 DIAGNOSIS — M25662 Stiffness of left knee, not elsewhere classified: Secondary | ICD-10-CM | POA: Diagnosis not present

## 2021-07-02 DIAGNOSIS — R29898 Other symptoms and signs involving the musculoskeletal system: Secondary | ICD-10-CM | POA: Diagnosis not present

## 2021-07-02 NOTE — Therapy (Signed)
?OUTPATIENT PHYSICAL THERAPY TREATMENT NOTE ? ? ?Patient Name: Luke Chavez ?MRN: [id number] ?DOB:[birth year], 56 y.o., male ?Today's Date: 07/02/2021 ? ?PCP: Sharilyn Sites, MD ?REFERRING PROVIDER: Carole Civil, MD ? ?END OF SESSION:  ? PT End of Session - 07/02/21 1114   ? ? Visit Number 9   ? Number of Visits 18   ? Date for PT Re-Evaluation 07/20/21   ? Authorization Type BCBS COMM PPO   ? PT Start Time 1117   ? PT Stop Time 2536   ? PT Time Calculation (min) 41 min   ? Activity Tolerance Patient tolerated treatment well   ? Behavior During Therapy [facility] for tasks assessed/performed   ? ?  ?  ? ?  ? ? ? ? ?Past Medical History:  ?Diagnosis Date  ? Arthritis   ? Chronic back pain   ? Hypertension   ? ?Past Surgical History:  ?Procedure Laterality Date  ? COLONOSCOPY N/A 04/17/2015  ? Procedure: COLONOSCOPY;  Surgeon: Danie Binder, MD;  [facility];  Service: Endoscopy;  Laterality: N/A;  1:00 PM  ? KNEE ARTHROSCOPY WITH MEDIAL MENISECTOMY Left 04/24/2017  ? Procedure: KNEE ARTHROSCOPY WITH MEDIAL MENISECTOMY;  Surgeon: Carole Civil, MD;  Location: [facility];  Service: Orthopedics;  Laterality: Left;  ? TOTAL KNEE ARTHROPLASTY Left 05/25/2021  ? Procedure: TOTAL KNEE ARTHROPLASTY;  Surgeon: Carole Civil, MD;  Location: [facility];  Service: Orthopedics;  Laterality: Left;  ? ?Patient Active Problem List  ? Diagnosis Date Noted  ? Osteoarthritis of left knee 05/25/2021  ? Compression fracture of lumbar spine, non-traumatic ([place]) 07/04/2020  ? Degenerative scoliosis 07/04/2020  ? Lumbar pain 07/04/2020  ? Medial meniscus, posterior horn derangement 05/13/2017  ? S/P left knee arthroscopy 04/24/17   ? Primary osteoarthritis of left knee   ? ? ?REFERRING DIAG: Primary osteoarthritis of left knee  ? ?THERAPY DIAG:  ?Left knee pain, unspecified chronicity ? ?Other abnormalities of gait and mobility ? ?Stiffness of left knee, not elsewhere classified ?Left knee pain, unspecified chronicity ?   ?Stiffness of left knee, not elsewhere classified ?  ?Other abnormalities of gait and mobility ? ?PERTINENT HISTORY: LT TKA 05/25/21 ? ?PRECAUTIONS: None ? ?SUBJECTIVE:  Report ongoing swelling and pain at night, good otherwise.  ? ?PAIN:  ?Are you having pain? Yes: NPRS scale: 2/10 ?Pain location: Lt knee ?Pain description: stiff and achey ?Aggravating factors: bending ?Relieving factors: CPM ? ? ? ? ?OBJECTIVE:  ? ? ?  ?LE ROM: ?  ?Active ROM Left ?06/08/2021 Left ?06/13/21 Left ?06/19/21 Left ?06/21/21 ? Left  Left knee AAROM sitting and supine ?06/25/21  ?Knee flexion 83 94 95 96 98 105  ?Knee extension -12 -8 -8 -6  -8 -7  ?   ?LE MMT: ?  ?MMT Right ?06/08/2021 Left ?06/08/2021  ?Hip flexion 5 3+  ?Knee extension 5 3+  ?Ankle dorsiflexion 5  5  ? (Blank rows = not tested) ?  ?  ?GAIT: ?At evaluation: Distance walked: 235 feet ?Assistive device utilized: Environmental consultant - 2 wheeled ?Level of assistance: Modified independence ?Comments: Decreased LT knee and hip flexion ?  ?  ?  ?TODAY'S TREATMENT: ? 07/02/21 ?Manual PROM knee flexion and extension  ? ?Prone hang 5lb 3 min  ?Prone quad stretch with strap 3 x30" (educated on contract relax)  ?TKE 4 plates x20 ?Step up 4 inch x20 ?Step down 4 inch x20  ?Leg press 4 plates 2 x 10 (eccentric lowering)  ?Rec  bike seat 19 4 min EOS  ? ?(LT knee AROM: -9 to 100 degrees (post manual) ) ? ?06/26/21 ?Bike seat 22 x 15' (patient able to make full revolution although with hip substitution) ?Knee drives on 12" box x 2' ?H/S stretch 5 x 20" ?Sitting on Step buttocks drop for knee flexion x 10 ?Seated on mat manual knee flexion x 5 ; contract relax x 5; manual knee ext in supine; all manual interventions performed independently of other interventions.  ? ? ? 06/25/21 ?Bike seat 23 x 10' (patient able to make full revolution although with hip substitution) ? ?Standing: ?Knee drives for knee flexion on 12' box x 15 reps ? ?Seated on mat manual knee flexion x 5 ; contract relax x 5; manual knee ext in  supine; all manual interventions performed independently of other interventions.  ? ?Seated and supine knee flexion today AAROM 105 degrees ?Supine knee ext -7 today AAROM ? ?  ?  ?PATIENT EDUCATION:  ?Education details: Reviewed goals, educated importance of HEP compliance for maximal benefits, pt able to recall and demonstrate appropriate mechanics. ?Person educated: Patient ?Education method: Explanation and Demonstration ?Education comprehension: verbalized understanding and returned demonstration ?  ?  ?HOME EXERCISE PROGRAM: ?07/02/21 ?Prone hang ?Prone quad stretch  ? ? ?Access Code: [email or web address] ?Date: 06/08/2021 supine QS, HS with strap, SLR with QS, ankle pumps, GS, knee extension mobilization with weight ?  ?ASSESSMENT: ?  ?CLINICAL IMPRESSION: ?Patient continues to struggle with AROM. Educated patient on importance of increased frequency of knee stretches, and reviewed and modified form. Encouraged patient to hold longer, especially with knee extension based stretching. Added prone hangs, and prone quad stretch to HEP and issued handout. Patient will continue to benefit from skilled therapy services to reduce remaining deficits and improve functional ability.  ? ? ?OBJECTIVE IMPAIRMENTS Abnormal gait, decreased activity tolerance, decreased balance, decreased mobility, difficulty walking, decreased ROM, decreased strength, hypomobility, increased edema, increased fascial restrictions, impaired flexibility, improper body mechanics, and pain.  ?  ?ACTIVITY LIMITATIONS cleaning, community activity, driving, meal prep, occupation, laundry, yard work, shopping, and yard work.  ?  ?  ?GOALS: ?SHORT TERM GOALS: Target date: 06/29/2021 ?  ?Patient will be independent with initial HEP and self-management strategies to improve functional outcomes ?Baseline:  ?Goal status: Achieved ?  ?LONG TERM GOALS: Target date: 07/20/2021 ?  ?Patient will be independent with advanced HEP and self-management strategies to improve  functional outcomes ?Baseline:  ?Goal status: Ongoing ?  ?2.  Patient will be able to ambulate at least 400 feet during 2MWT with LRAD to demonstrate improved ability to perform functional mobility and associated tasks. ?Baseline:  ?Goal status: Ongoing ?  ?3.  Patient will have LT knee AROM 0-120 degrees to improve functional mobility and facilitate squatting to pick up items from floor. ?Baseline: 06/25/21 supine AAROM KE -7, KF 105 ?Goal status:Ongoing ?  ?4. Patient will have equal to or > 4+/5 MMT throughout LLE to improve ability to perform functional mobility, stair ambulation and ADLs.  ?Baseline:  ?Goal status: Ongoing ?  ?  ?  ?PLAN: ?PT FREQUENCY: 3x/week ?  ?PT DURATION: 6 weeks ?  ?PLANNED INTERVENTIONS: Therapeutic exercises, Therapeutic activity, Neuromuscular re-education, Balance training, Gait training, Patient/Family education, and Joint mobilization ?  ?PLAN FOR NEXT SESSION: Progress knee mobility, glute and quad strength as tolerated. Manual as needed for improving ROM. ? ?11:50 AM, 07/02/21 ?Josue Hector PT DPT  ?Physical Therapist with [place]  ?[facility]  ?[phone] ? ? ? ?

## 2021-07-03 ENCOUNTER — Encounter (HOSPITAL_COMMUNITY): Payer: Self-pay | Admitting: Physical Therapy

## 2021-07-03 ENCOUNTER — Ambulatory Visit (HOSPITAL_COMMUNITY): Payer: BC Managed Care – PPO | Admitting: Physical Therapy

## 2021-07-03 DIAGNOSIS — G8929 Other chronic pain: Secondary | ICD-10-CM | POA: Diagnosis not present

## 2021-07-03 DIAGNOSIS — M25562 Pain in left knee: Secondary | ICD-10-CM | POA: Diagnosis not present

## 2021-07-03 DIAGNOSIS — M25662 Stiffness of left knee, not elsewhere classified: Secondary | ICD-10-CM

## 2021-07-03 DIAGNOSIS — R29898 Other symptoms and signs involving the musculoskeletal system: Secondary | ICD-10-CM | POA: Diagnosis not present

## 2021-07-03 DIAGNOSIS — R2689 Other abnormalities of gait and mobility: Secondary | ICD-10-CM

## 2021-07-03 DIAGNOSIS — M25512 Pain in left shoulder: Secondary | ICD-10-CM | POA: Diagnosis not present

## 2021-07-03 NOTE — Therapy (Signed)
?OUTPATIENT PHYSICAL THERAPY TREATMENT NOTE ? ? ?Patient Name: Luke Chavez [place]ig[place] ?MRN: [id number] ?DOB:09-Sep-1965, 56 y.o., male ?Today's Date: 07/03/2021 ?Progress Note ?Reporting Period 06/08/21 to 07/03/21 ? ?See note below for Objective Data and Assessment of Progress/Goals.  ? ? ? ?PCP: Sharilyn Sites, MD ?REFERRING PROVIDER: Carole Civil, MD ? ?END OF SESSION:  ? PT End of Session - 07/03/21 0905   ? ? Visit Number 10   ? Number of Visits 18   ? Date for PT Re-Evaluation 07/20/21   ? Authorization Type BCBS COMM PPO   ? PT Start Time 0902   ? PT Stop Time [id number]   ? PT Time Calculation (min) 40 min   ? Activity Tolerance Patient tolerated treatment well   ? Behavior During Therapy [facility] for tasks assessed/performed   ? ?  ?  ? ?  ? ? ? ? ?Past Medical History:  ?Diagnosis Date  ? Arthritis   ? Chronic back pain   ? Hypertension   ? ?Past Surgical History:  ?Procedure Laterality Date  ? COLONOSCOPY N/A 04/17/2015  ? Procedure: COLONOSCOPY;  Surgeon: Danie Binder, MD;  [facility];  Service: Endoscopy;  Laterality: N/A;  1:00 PM  ? KNEE ARTHROSCOPY WITH MEDIAL MENISECTOMY Left 04/24/2017  ? Procedure: KNEE ARTHROSCOPY WITH MEDIAL MENISECTOMY;  Surgeon: Carole Civil, MD;  Location: [facility];  Service: Orthopedics;  Laterality: Left;  ? TOTAL KNEE ARTHROPLASTY Left 05/25/2021  ? Procedure: TOTAL KNEE ARTHROPLASTY;  Surgeon: Carole Civil, MD;  Location: [facility];  Service: Orthopedics;  Laterality: Left;  ? ?Patient Active Problem List  ? Diagnosis Date Noted  ? Osteoarthritis of left knee 05/25/2021  ? Compression fracture of lumbar spine, non-traumatic ([place]) 07/04/2020  ? Degenerative scoliosis 07/04/2020  ? Lumbar pain 07/04/2020  ? Medial meniscus, posterior horn derangement 05/13/2017  ? S/P left knee arthroscopy 04/24/17   ? Primary osteoarthritis of left knee   ? ? ?REFERRING DIAG: Primary osteoarthritis of left knee  ? ?THERAPY DIAG:  ?Left knee pain, unspecified chronicity ? ?Other  abnormalities of gait and mobility ? ?Stiffness of left knee, not elsewhere classified ?Left knee pain, unspecified chronicity ?  ?Stiffness of left knee, not elsewhere classified ?  ?Other abnormalities of gait and mobility ? ?PERTINENT HISTORY: LT TKA 05/25/21 ? ?PRECAUTIONS: None ? ?SUBJECTIVE:  Still some swelling, a little sore after yesterday but not too bad. Pain is still mostly at night.  ? ?PAIN:  ?Are you having pain? Yes: NPRS scale: 2/10 ?Pain location: Lt knee ?Pain description: stiff and achey ?Aggravating factors: bending ?Relieving factors: CPM ? ? ? ? ?OBJECTIVE:  ? ? ?  ?LE ROM: ?  ?Active ROM Left ?06/08/2021 Left ?06/13/21 Left ?06/19/21 Left ?06/21/21 ? Left  Left knee AAROM sitting and supine ?06/25/21 Left  ?AROM ?07/03/21  ?Knee flexion 83 94 95 96 98 105 105  ?Knee extension -12 -8 -8 -6  -8 -7   ?   ?LE MMT: ?  ?MMT Right ?06/08/2021 Left ?06/08/2021 Left ?07/03/2021  ?Hip flexion 5 3+ 4+  ?Knee extension 5 3+ 4+  ?Ankle dorsiflexion '5  5 5  ' ? (Blank rows = not tested) ?  ?  ?GAIT: ?At evaluation: Distance walked: 235 feet  ?07/03/21: 370 feet with no AD  ?Assistive device utilized: Environmental consultant - 2 wheeled ?Level of assistance: Modified independence ?Comments: Decreased LT knee and hip flexion ?  ?  ?  ?TODAY'S TREATMENT: ? 07/03/21 ?Reassess ?Rec bike  seat 19 5 min warmup  ?Gait ?Prone hang with 5 lb 3 min  ? ?Manual PROM knee flexion and extension supine, also knee flexion seated at EOB using massage gun on LT quad to inhibit muscle guarding, massage gun to hamstrings in prone with prone hang for knee extension  ? ?(LT knee AROM: -9 to 105 degrees (post manual) ) ?  ? ? ?07/02/21 ?Manual PROM knee flexion and extension  ? ?Prone hang 5lb 3 min  ?Prone quad stretch with strap 3 x30" (educated on contract relax)  ?TKE 4 plates x20 ?Step up 4 inch x20 ?Step down 4 inch x20  ?Leg press 4 plates 2 x 10 (eccentric lowering)  ?Rec bike seat 19 4 min EOS  ? ?(LT knee AROM: -9 to 100 degrees (post manual)  ) ? ?06/26/21 ?Bike seat 22 x 15' (patient able to make full revolution although with hip substitution) ?Knee drives on 12" box x 2' ?H/S stretch 5 x 20" ?Sitting on Step buttocks drop for knee flexion x 10 ?Seated on mat manual knee flexion x 5 ; contract relax x 5; manual knee ext in supine; all manual interventions performed independently of other interventions.  ? ? ?  ?PATIENT EDUCATION:  ?Education details: Reviewed goals, educated importance of HEP compliance for maximal benefits, pt able to recall and demonstrate appropriate mechanics. 5/2 on progress, exercise form and POC  ?Person educated: Patient ?Education method: Explanation and Demonstration ?Education comprehension: verbalized understanding and returned demonstration ?  ?  ?HOME EXERCISE PROGRAM: ?07/02/21 ?Prone hang ?Prone quad stretch  ? ? ?Access Code: [email or web address] ?Date: 06/08/2021 supine QS, HS with strap, SLR with QS, ankle pumps, GS, knee extension mobilization with weight ?  ?ASSESSMENT: ?  ?CLINICAL IMPRESSION: ?Patient continues to struggle with AROM. Performed reassessment today. Patient limited by significant muscle guarding with manual stretching and PROM. Discussed use of medications as prescribed by MD for reducing pain and muscle tone with stretching to facilitate efforts for gaining ROM. Educated Patient on importance of increasing frequency and duration of knee extension stretches at home. Strength is good, and gait is steadily improving. Still limited with stair ambulation due to ROM restrictions and quad control. Will continue to focus on these aspects to improve patient functional mobility and ability to perform ADLs.  ? ?OBJECTIVE IMPAIRMENTS Abnormal gait, decreased activity tolerance, decreased balance, decreased mobility, difficulty walking, decreased ROM, decreased strength, hypomobility, increased edema, increased fascial restrictions, impaired flexibility, improper body mechanics, and pain.  ?  ?ACTIVITY LIMITATIONS cleaning,  community activity, driving, meal prep, occupation, laundry, yard work, shopping, and yard work.  ?  ?  ?GOALS: ?SHORT TERM GOALS: Target date: 06/29/2021 ?  ?Patient will be independent with initial HEP and self-management strategies to improve functional outcomes ?Baseline:  ?Goal status: Achieved ?  ?LONG TERM GOALS: Target date: 07/20/2021 ?  ?Patient will be independent with advanced HEP and self-management strategies to improve functional outcomes ?Baseline:  ?Goal status: Ongoing ?  ?2.  Patient will be able to ambulate at least 400 feet during 2MWT with LRAD to demonstrate improved ability to perform functional mobility and associated tasks. ?Baseline: 370 feet with no AD  ?Goal status: Ongoing ?  ?3.  Patient will have LT knee AROM 0-120 degrees to improve functional mobility and facilitate squatting to pick up items from floor. ?Baseline:  ?Goal status:Ongoing ?  ?4. Patient will have equal to or > 4+/5 MMT throughout LLE to improve ability to perform functional mobility, stair ambulation and ADLs.  ?  Baseline: See MMT ?Goal status: MET   ?  ?  ?  ?PLAN: ?PT FREQUENCY: 3x/week ?  ?PT DURATION: 6 weeks ?  ?PLANNED INTERVENTIONS: Therapeutic exercises, Therapeutic activity, Neuromuscular re-education, Balance training, Gait training, Patient/Family education, and Joint mobilization ?  ?PLAN FOR NEXT SESSION: Progress knee mobility, glute and quad strength as tolerated. Manual as needed for improving ROM. ? ?9:42 AM, 07/03/21 ?Josue Hector PT DPT  ?Physical Therapist with [place]  ?[facility]  ?[phone] ? ? ? ?

## 2021-07-10 ENCOUNTER — Ambulatory Visit (HOSPITAL_COMMUNITY): Payer: BC Managed Care – PPO | Admitting: Physical Therapy

## 2021-07-10 DIAGNOSIS — R2689 Other abnormalities of gait and mobility: Secondary | ICD-10-CM | POA: Diagnosis not present

## 2021-07-10 DIAGNOSIS — M25512 Pain in left shoulder: Secondary | ICD-10-CM | POA: Diagnosis not present

## 2021-07-10 DIAGNOSIS — M25662 Stiffness of left knee, not elsewhere classified: Secondary | ICD-10-CM

## 2021-07-10 DIAGNOSIS — M25562 Pain in left knee: Secondary | ICD-10-CM | POA: Diagnosis not present

## 2021-07-10 DIAGNOSIS — G8929 Other chronic pain: Secondary | ICD-10-CM | POA: Diagnosis not present

## 2021-07-10 DIAGNOSIS — R29898 Other symptoms and signs involving the musculoskeletal system: Secondary | ICD-10-CM | POA: Diagnosis not present

## 2021-07-10 NOTE — Therapy (Addendum)
?OUTPATIENT PHYSICAL THERAPY TREATMENT NOTE ? ? ?Patient Name: Luke Chavez ?MRN: [id number] ?DOB:[birth year], 56 y.o., male ?Today's Date: 07/10/2021 ? ? ?PCP: Sharilyn Sites, MD ?REFERRING PROVIDER: Carole Civil, MD ? ?END OF SESSION:  ? PT End of Session - 07/10/21 1401   ? ? Visit Number 11   ? Number of Visits 18   ? Date for PT Re-Evaluation 07/20/21   ? Authorization Type BCBS COMM PPO   ? PT Start Time 1400   ? PT Stop Time 3295   ? PT Time Calculation (min) 46 min   ? Activity Tolerance Patient tolerated treatment well   ? Behavior During Therapy [facility] for tasks assessed/performed   ? ?  ?  ? ?  ? ? ? ? ?Past Medical History:  ?Diagnosis Date  ? Arthritis   ? Chronic back pain   ? Hypertension   ? ?Past Surgical History:  ?Procedure Laterality Date  ? COLONOSCOPY N/A 04/17/2015  ? Procedure: COLONOSCOPY;  Surgeon: Danie Binder, MD;  [facility];  Service: Endoscopy;  Laterality: N/A;  1:00 PM  ? KNEE ARTHROSCOPY WITH MEDIAL MENISECTOMY Left 04/24/2017  ? Procedure: KNEE ARTHROSCOPY WITH MEDIAL MENISECTOMY;  Surgeon: Carole Civil, MD;  Location: [facility];  Service: Orthopedics;  Laterality: Left;  ? TOTAL KNEE ARTHROPLASTY Left 05/25/2021  ? Procedure: TOTAL KNEE ARTHROPLASTY;  Surgeon: Carole Civil, MD;  Location: [facility];  Service: Orthopedics;  Laterality: Left;  ? ?Patient Active Problem List  ? Diagnosis Date Noted  ? Osteoarthritis of left knee 05/25/2021  ? Compression fracture of lumbar spine, non-traumatic ([place]) 07/04/2020  ? Degenerative scoliosis 07/04/2020  ? Lumbar pain 07/04/2020  ? Medial meniscus, posterior horn derangement 05/13/2017  ? S/P left knee arthroscopy 04/24/17   ? Primary osteoarthritis of left knee   ? ? ?REFERRING DIAG: Primary osteoarthritis of left knee  ? ?THERAPY DIAG:  ?Left knee pain, unspecified chronicity ? ?Other abnormalities of gait and mobility ? ?Stiffness of left knee, not elsewhere classified ?Left knee pain, unspecified chronicity ?   ?Stiffness of left knee, not elsewhere classified ?  ?Other abnormalities of gait and mobility ? ?PERTINENT HISTORY: LT TKA 05/25/21 ? ?PRECAUTIONS: None ? ?SUBJECTIVE:  Still some swelling, more soreness and stiffness than pain.  ? ?PAIN:  ?Are you having pain? Yes: NPRS scale: 0/10 ?Pain location: Lt knee ?Pain description: stiff and achey ?Aggravating factors: bending ?Relieving factors: CPM ? ? ? ? ?OBJECTIVE:  ? ? ?  ?LE ROM: ?  ?Active ROM Left ?06/08/21 Left ?06/13/21 Left ?06/19/21 Left ?06/21/21 ? Left knee AAROM sitting and supine ?06/25/21 Left  ?AROM ?07/03/21 Left ?07/10/21 ?AROM/ ?AAROM  ?Knee flexion 83 94 95 96 105 105 112/115  ?Knee extension -12 -8 -8 -6 -7 -9 -9  ?   ?LE MMT: ?  ?MMT Right ?06/08/2021 Left ?06/08/2021 Left ?07/03/2021  ?Hip flexion 5 3+ 4+  ?Knee extension 5 3+ 4+  ?Ankle dorsiflexion _0 ? (Blank rows = not tested) ?  ?  ?GAIT: ?At evaluation: Distance walked: 235 feet  ?07/03/21: 370 feet with no AD  ?Assistive device utilized: Environmental consultant - 2 wheeled ?Level of assistance: Modified independence ?Comments: Decreased LT knee and hip flexion ?  ?  ?  ?TODAY'S TREATMENT: ?07/10/21 ?Bike seat 18 5 minutes ?Standing  Knee flexion stretch 10X10" on 12" step ? Hamstring stretch 3X30" on 12" step  ? TKE 4 plates x20 ? Leg press 4 plates  2 x 10 (eccentric lowering)  ? Stairs 7" with 1 HR reciprocally 4RT ?Manual: prone knee hang with 5# for 5 minutes with manual massage and use of massage gun on hamstrings to relax mm ?PROM for knee extension with overpressure in supine, knee flexion in supine ?AROM :  -9-112 ? ?07/03/21 ?Reassess ?Rec bike seat 19 5 min warmup  ?Gait ?Prone hang with 5 lb 3 min  ? ?Manual PROM knee flexion and extension supine, also knee flexion seated at EOB using massage gun on LT quad to inhibit muscle guarding, massage gun to hamstrings in prone with prone hang for knee extension  ? ?(LT knee AROM: -9 to 105 degrees (post manual) ) ?  ? ? ?07/02/21 ?Manual PROM knee flexion and extension   ? ?Prone hang 5lb 3 min  ?Prone quad stretch with strap 3 x30" (educated on contract relax)  ?TKE 4 plates x20 ?Step up 4 inch x20 ?Step down 4 inch x20  ?Leg press 4 plates 2 x 10 (eccentric lowering)  ?Rec bike seat 19 4 min EOS  ? ?(LT knee AROM: -9 to 100 degrees (post manual) ) ? ?06/26/21 ?Bike seat 22 x 15' (patient able to make full revolution although with hip substitution) ?Knee drives on 12" box x 2' ?H/S stretch 5 x 20" ?Sitting on Step buttocks drop for knee flexion x 10 ?Seated on mat manual knee flexion x 5 ; contract relax x 5; manual knee ext in supine; all manual interventions performed independently of other interventions.  ? ? ?  ?PATIENT EDUCATION:  ?Education details: Reviewed goals, educated importance of HEP compliance for maximal benefits, pt able to recall and demonstrate appropriate mechanics. 5/2 on progress, exercise form and POC  ?Person educated: Patient ?Education method: Explanation and Demonstration ?Education comprehension: verbalized understanding and returned demonstration ?  ?  ?HOME EXERCISE PROGRAM: ?07/02/21 ?Prone hang ?Prone quad stretch  ? ? ?Access Code: [email or web address] ?Date: 06/08/2021 supine QS, HS with strap, SLR with QS, ankle pumps, GS, knee extension mobilization with weight ?  ?ASSESSMENT: ?  ?CLINICAL IMPRESSION: ?Continued with focus on improving ROM and functional strength.  Pt without difficulty completing therex but with noted general stiffness in Rt knee.  Pt able to negotiate 7" steps reciprocally using 1 HR with minimal antalgia, specifically descending steps. Continued with manual in both prone and supine for quad and hamstring.  Hamstring much tighter but able to loosen following manual and use of massage gun with prone hangs.  Resultant ROM improved for flexion at 112 AROM and 115 AAROM, however extension still lacking 9 degrees.  Patient will continue to benefit from skilled therapy services to reduce remaining deficits and improve functional ability.   ? ?OBJECTIVE IMPAIRMENTS Abnormal gait, decreased activity tolerance, decreased balance, decreased mobility, difficulty walking, decreased ROM, decreased strength, hypomobility, increased edema, increased fascial restrictions, impaired flexibility, improper body mechanics, and pain.  ?  ?ACTIVITY LIMITATIONS cleaning, community activity, driving, meal prep, occupation, laundry, yard work, shopping, and yard work.  ?  ?  ?GOALS: ?SHORT TERM GOALS: Target date: 06/29/2021 ?  ?Patient will be independent with initial HEP and self-management strategies to improve functional outcomes ?Baseline:  ?Goal status: Achieved ?  ?LONG TERM GOALS: Target date: 07/20/2021 ?  ?Patient will be independent with advanced HEP and self-management strategies to improve functional outcomes ?Baseline:  ?Goal status: Ongoing ?  ?2.  Patient will be able to ambulate at least 400 feet during 2MWT with LRAD to demonstrate improved ability to perform  functional mobility and associated tasks. ?Baseline: 370 feet with no AD  ?Goal status: Ongoing ?  ?3.  Patient will have LT knee AROM 0-120 degrees to improve functional mobility and facilitate squatting to pick up items from floor. ?Baseline:  ?Goal status:Ongoing ?  ?4. Patient will have equal to or > 4+/5 MMT throughout LLE to improve ability to perform functional mobility, stair ambulation and ADLs.  ?Baseline: See MMT ?Goal status: MET   ?  ?  ?  ?PLAN: ?PT FREQUENCY: 3x/week ?  ?PT DURATION: 6 weeks ?  ?PLANNED INTERVENTIONS: Therapeutic exercises, Therapeutic activity, Neuromuscular re-education, Balance training, Gait training, Patient/Family education, and Joint mobilization ?  ?PLAN FOR NEXT SESSION: Progress knee mobility, glute and quad strength as tolerated. Manual as needed for improving ROM. ? ?Teena Irani, PTA/CLT, WTA ?[phone] ?2:52 PM, 07/10/21 ? ?

## 2021-07-11 ENCOUNTER — Ambulatory Visit (HOSPITAL_COMMUNITY): Payer: BC Managed Care – PPO | Admitting: Physical Therapy

## 2021-07-11 DIAGNOSIS — R29898 Other symptoms and signs involving the musculoskeletal system: Secondary | ICD-10-CM | POA: Diagnosis not present

## 2021-07-11 DIAGNOSIS — M25662 Stiffness of left knee, not elsewhere classified: Secondary | ICD-10-CM

## 2021-07-11 DIAGNOSIS — M25512 Pain in left shoulder: Secondary | ICD-10-CM | POA: Diagnosis not present

## 2021-07-11 DIAGNOSIS — G8929 Other chronic pain: Secondary | ICD-10-CM | POA: Diagnosis not present

## 2021-07-11 DIAGNOSIS — R2689 Other abnormalities of gait and mobility: Secondary | ICD-10-CM

## 2021-07-11 DIAGNOSIS — M25562 Pain in left knee: Secondary | ICD-10-CM

## 2021-07-11 NOTE — Therapy (Signed)
?OUTPATIENT PHYSICAL THERAPY TREATMENT NOTE ? ? ?Patient Name: Luke Chavez ?MRN: [id number] ?DOB:03-18-1965, 56 y.o., male ?Today's Date: 07/11/2021 ? ? ?PCP: Sharilyn Sites, MD ?REFERRING PROVIDER: Carole Civil, MD ? ?END OF SESSION:  ? PT End of Session - 07/11/21 1318   ? ? Visit Number 12   ? Number of Visits 18   ? Date for PT Re-Evaluation 07/20/21   ? Authorization Type BCBS COMM PPO   ? PT Start Time 1318   ? PT Stop Time 4975   ? PT Time Calculation (min) 46 min   ? Activity Tolerance Patient tolerated treatment well   ? Behavior During Therapy [facility] for tasks assessed/performed   ? ?  ?  ? ?  ? ? ? ? ?Past Medical History:  ?Diagnosis Date  ? Arthritis   ? Chronic back pain   ? Hypertension   ? ?Past Surgical History:  ?Procedure Laterality Date  ? COLONOSCOPY N/A 04/17/2015  ? Procedure: COLONOSCOPY;  Surgeon: Danie Binder, MD;  [facility];  Service: Endoscopy;  Laterality: N/A;  1:00 PM  ? KNEE ARTHROSCOPY WITH MEDIAL MENISECTOMY Left 04/24/2017  ? Procedure: KNEE ARTHROSCOPY WITH MEDIAL MENISECTOMY;  Surgeon: Carole Civil, MD;  Location: [facility];  Service: Orthopedics;  Laterality: Left;  ? TOTAL KNEE ARTHROPLASTY Left 05/25/2021  ? Procedure: TOTAL KNEE ARTHROPLASTY;  Surgeon: Carole Civil, MD;  Location: [facility];  Service: Orthopedics;  Laterality: Left;  ? ?Patient Active Problem List  ? Diagnosis Date Noted  ? Osteoarthritis of left knee 05/25/2021  ? Compression fracture of lumbar spine, non-traumatic ([place]) 07/04/2020  ? Degenerative scoliosis 07/04/2020  ? Lumbar pain 07/04/2020  ? Medial meniscus, posterior horn derangement 05/13/2017  ? S/P left knee arthroscopy 04/24/17   ? Primary osteoarthritis of left knee   ? ? ?REFERRING DIAG: Primary osteoarthritis of left knee  ? ?THERAPY DIAG:  ?Left knee pain, unspecified chronicity ? ?Other abnormalities of gait and mobility ? ?Stiffness of left knee, not elsewhere classified ?Left knee pain, unspecified chronicity ?   ?Stiffness of left knee, not elsewhere classified ?  ?Other abnormalities of gait and mobility ? ?PERTINENT HISTORY: LT TKA 05/25/21 ? ?PRECAUTIONS: None ? ?SUBJECTIVE:  Stiffness persists but without soreness today from yesterday's session.  States he slept a little better last night. ? ?PAIN:  ?Are you having pain? Yes: NPRS scale: 0/10 ?Pain location: Lt knee ?Pain description: stiff and achey ?Aggravating factors: bending ?Relieving factors: CPM ? ? ? ? ?OBJECTIVE:  ? ? ?  ?LE ROM: ?  ?Active ROM Left ?06/08/21 Left ?06/13/21 Left ?06/19/21 Left ?06/21/21 ? Left knee AAROM sitting and supine ?06/25/21 Left  ?AROM ?07/03/21 Left ?07/10/21 ?AROM/ ?AAROM Left ?07/11/21 ?AROM/ ?AAROM  ?Knee flexion 83 94 95 96 105 105 112/115 115/120  ?Knee extension -12 -8 -8 -6 -7 -9 -9 -9  ?   ?LE MMT: ?  ?MMT Right ?06/08/2021 Left ?06/08/2021 Left ?07/03/2021  ?Hip flexion 5 3+ 4+  ?Knee extension 5 3+ 4+  ?Ankle dorsiflexion _0 ? (Blank rows = not tested) ?  ?  ?GAIT: ?At evaluation: Distance walked: 235 feet  ?07/03/21: 370 feet with no AD  ?Assistive device utilized: Environmental consultant - 2 wheeled ?Level of assistance: Modified independence ?Comments: Decreased LT knee and hip flexion ?  ?  ?  ?TODAY'S TREATMENT: ?07/11/21 ?Bike seat 18 5 minutes ?Standing  Knee flexion stretch 10X10" on 12" step ? Hamstring stretch 3X30"  on 12" step  ? TKE 4 plates x20 ? Leg press 4 plates 2 x 10 (eccentric lowering)  ? Stairs 7" with 1 HR reciprocally 5RT ?Manual: prone knee hang without weight, roller on hamstring along with manual massage and use of massage gun to relax mm ?PROM for knee extension with overpressure in supine, knee flexion in supine ?AROM :  -9-115 ? ?07/10/21 ?Bike seat 18 5 minutes ?Standing  Knee flexion stretch 10X10" on 12" step ? Hamstring stretch 3X30" on 12" step  ? TKE 4 plates x20 ? Leg press 4 plates 2 x 10 (eccentric lowering)  ? Stairs 7" with 1 HR reciprocally 4RT ?Manual: prone knee hang with 5# for 5 minutes with manual massage and  use of massage gun on hamstrings to relax mm ?PROM for knee extension with overpressure in supine, knee flexion in supine ?AROM :  -9-112 ? ?07/03/21 ?Reassess ?Rec bike seat 19 5 min warmup  ?Gait ?Prone hang with 5 lb 3 min  ? ?Manual PROM knee flexion and extension supine, also knee flexion seated at EOB using massage gun on LT quad to inhibit muscle guarding, massage gun to hamstrings in prone with prone hang for knee extension  ? ?(LT knee AROM: -9 to 105 degrees (post manual) ) ?  ? ? ?07/02/21 ?Manual PROM knee flexion and extension  ? ?Prone hang 5lb 3 min  ?Prone quad stretch with strap 3 x30" (educated on contract relax)  ?TKE 4 plates x20 ?Step up 4 inch x20 ?Step down 4 inch x20  ?Leg press 4 plates 2 x 10 (eccentric lowering)  ?Rec bike seat 19 4 min EOS  ? ?(LT knee AROM: -9 to 100 degrees (post manual) ) ? ?  ?PATIENT EDUCATION:  ?Education details: Reviewed goals, educated importance of HEP compliance for maximal benefits, pt able to recall and demonstrate appropriate mechanics. 5/2 on progress, exercise form and POC  ?Person educated: Patient ?Education method: Explanation and Demonstration ?Education comprehension: verbalized understanding and returned demonstration ?  ?  ?HOME EXERCISE PROGRAM: ?07/02/21 ?Prone hang ?Prone quad stretch  ? ? ?Access Code: [email or web address] ?Date: 06/08/2021 supine QS, HS with strap, SLR with QS, ankle pumps, GS, knee extension mobilization with weight ?  ?ASSESSMENT: ?  ?CLINICAL IMPRESSION: ?Focus on improving Rt knee ROM and functional strength.   Continued stair training and normalizing gait as continues to present with stiff Rt knee with ambulation and weight shifting with walk.  Pt able to negotiate 7" steps reciprocally using 1 HR with antalgia on descent. Continued with manual in both prone and supine for quad, hamstring and ITB.  Use of roller on hamstring while in prone knee hang with palpable loosening following.  ITB also tight with manual and massage gun used on this  area.  Pt continues to make good gains with flexion, actively achieving 115 and 120 assisted by therapist.  Extension still lacking 9 degrees.  Patient will continue to benefit from skilled therapy services to reduce remaining deficits and improve functional ability.  ? ?OBJECTIVE IMPAIRMENTS Abnormal gait, decreased activity tolerance, decreased balance, decreased mobility, difficulty walking, decreased ROM, decreased strength, hypomobility, increased edema, increased fascial restrictions, impaired flexibility, improper body mechanics, and pain.  ?  ?ACTIVITY LIMITATIONS cleaning, community activity, driving, meal prep, occupation, laundry, yard work, shopping, and yard work.  ?  ?  ?GOALS: ?SHORT TERM GOALS: Target date: 06/29/2021 ?  ?Patient will be independent with initial HEP and self-management strategies to improve functional outcomes ?Baseline:  ?Goal  status: Achieved ?  ?LONG TERM GOALS: Target date: 07/20/2021 ?  ?Patient will be independent with advanced HEP and self-management strategies to improve functional outcomes ?Baseline:  ?Goal status: Ongoing ?  ?2.  Patient will be able to ambulate at least 400 feet during 2MWT with LRAD to demonstrate improved ability to perform functional mobility and associated tasks. ?Baseline: 370 feet with no AD  ?Goal status: Ongoing ?  ?3.  Patient will have LT knee AROM 0-120 degrees to improve functional mobility and facilitate squatting to pick up items from floor. ?Baseline:  ?Goal status:Ongoing ?  ?4. Patient will have equal to or > 4+/5 MMT throughout LLE to improve ability to perform functional mobility, stair ambulation and ADLs.  ?Baseline: See MMT ?Goal status: MET   ?  ?  ?  ?PLAN: ?PT FREQUENCY: 3x/week ?  ?PT DURATION: 6 weeks ?  ?PLANNED INTERVENTIONS: Therapeutic exercises, Therapeutic activity, Neuromuscular re-education, Balance training, Gait training, Patient/Family education, and Joint mobilization ?  ?PLAN FOR NEXT SESSION: Progress knee mobility,  glute and quad strength as tolerated. Manual as needed for improving ROM. ? ?Teena Irani, PTA/CLT, WTA ?[phone] ?2:42 PM, 07/11/21 ? ?

## 2021-07-13 ENCOUNTER — Encounter (HOSPITAL_COMMUNITY): Payer: Self-pay

## 2021-07-13 ENCOUNTER — Ambulatory Visit (HOSPITAL_COMMUNITY): Payer: BC Managed Care – PPO

## 2021-07-13 DIAGNOSIS — M25662 Stiffness of left knee, not elsewhere classified: Secondary | ICD-10-CM | POA: Diagnosis not present

## 2021-07-13 DIAGNOSIS — G8929 Other chronic pain: Secondary | ICD-10-CM | POA: Diagnosis not present

## 2021-07-13 DIAGNOSIS — M25562 Pain in left knee: Secondary | ICD-10-CM | POA: Diagnosis not present

## 2021-07-13 DIAGNOSIS — R29898 Other symptoms and signs involving the musculoskeletal system: Secondary | ICD-10-CM | POA: Diagnosis not present

## 2021-07-13 DIAGNOSIS — R2689 Other abnormalities of gait and mobility: Secondary | ICD-10-CM

## 2021-07-13 DIAGNOSIS — M25512 Pain in left shoulder: Secondary | ICD-10-CM | POA: Diagnosis not present

## 2021-07-13 NOTE — Therapy (Signed)
?OUTPATIENT PHYSICAL THERAPY TREATMENT NOTE ? ? ?Patient Name: Luke Chavez ?MRN: [id number] ?DOB:1965/03/27, 56 y.o., male ?Today's Date: 07/13/2021 ? ? ?PCP: Sharilyn Sites, MD ?REFERRING PROVIDER: Carole Civil, MD ? ?END OF SESSION:  ? PT End of Session - 07/13/21 1006   ? ? Visit Number 13   ? Number of Visits 18   ? Date for PT Re-Evaluation 07/20/21   ? Authorization Type BCBS COMM PPO   ? PT Start Time [phone]   ? PT Stop Time 1003   ? PT Time Calculation (min) 47 min   ? Activity Tolerance Patient tolerated treatment well   ? Behavior During Therapy [facility] for tasks assessed/performed   ? ?  ?  ? ?  ? ? ? ? ? ?Past Medical History:  ?Diagnosis Date  ? Arthritis   ? Chronic back pain   ? Hypertension   ? ?Past Surgical History:  ?Procedure Laterality Date  ? COLONOSCOPY N/A 04/17/2015  ? Procedure: COLONOSCOPY;  Surgeon: Danie Binder, MD;  [facility];  Service: Endoscopy;  Laterality: N/A;  1:00 PM  ? KNEE ARTHROSCOPY WITH MEDIAL MENISECTOMY Left 04/24/2017  ? Procedure: KNEE ARTHROSCOPY WITH MEDIAL MENISECTOMY;  Surgeon: Carole Civil, MD;  Location: [facility];  Service: Orthopedics;  Laterality: Left;  ? TOTAL KNEE ARTHROPLASTY Left 05/25/2021  ? Procedure: TOTAL KNEE ARTHROPLASTY;  Surgeon: Carole Civil, MD;  Location: [facility];  Service: Orthopedics;  Laterality: Left;  ? ?Patient Active Problem List  ? Diagnosis Date Noted  ? Osteoarthritis of left knee 05/25/2021  ? Compression fracture of lumbar spine, non-traumatic ([place]) 07/04/2020  ? Degenerative scoliosis 07/04/2020  ? Lumbar pain 07/04/2020  ? Medial meniscus, posterior horn derangement 05/13/2017  ? S/P left knee arthroscopy 04/24/17   ? Primary osteoarthritis of left knee   ? ? ?REFERRING DIAG: Primary osteoarthritis of left knee  ? ?THERAPY DIAG:  ?Left knee pain, unspecified chronicity ? ?Other abnormalities of gait and mobility ? ?Stiffness of left knee, not elsewhere classified ? ?Other symptoms and signs involving the  musculoskeletal system ?Left knee pain, unspecified chronicity ?  ?Stiffness of left knee, not elsewhere classified ?  ?Other abnormalities of gait and mobility ? ?PERTINENT HISTORY: LT TKA 05/25/21 ? ?PRECAUTIONS: None ? ?SUBJECTIVE:  Knee continues to be stiff.  Reports compliance with HEP daily. ? ?PAIN:  ?Are you having pain? Yes: NPRS scale: 2/10 ?Pain location: Lt knee ?Pain description: stiff and achey ?Aggravating factors: bending ?Relieving factors: CPM ? ? ? ? ?OBJECTIVE:  ? ? ?  ?LE ROM: ?  ?Active ROM Left ?06/08/21 Left ?06/13/21 Left ?06/19/21 Left ?06/21/21 ? Left knee AAROM sitting and supine ?06/25/21 Left  ?AROM ?07/03/21 Left ?07/10/21 ?AROM/ ?AAROM Left ?07/11/21 ?AROM/ ?AAROM Left  ?07/13/21  ?Knee flexion 83 94 95 96 105 105 112/115 115/120 AROM 120  ?Knee extension -12 -8 -8 -6 -7 -9 -9 -9 -7  ?   ?LE MMT: ?  ?MMT Right ?06/08/2021 Left ?06/08/2021 Left ?07/03/2021  ?Hip flexion 5 3+ 4+  ?Knee extension 5 3+ 4+  ?Ankle dorsiflexion '5  5 5  ' ? (Blank rows = not tested) ?  ?  ?GAIT: ?At evaluation: Distance walked: 235 feet  ?07/03/21: 370 feet with no AD  ?Assistive device utilized: Environmental consultant - 2 wheeled ?Level of assistance: Modified independence ?Comments: Decreased LT knee and hip flexion ?  ?  ?  ?TODAY'S TREATMENT: ?07/13/21: ?Bike seat 18 5 minutes ?Standing: Bodycraft retro gait (  toe to heel retro, heel to toe forward) 4Pl ? TKE 20x 10" 4Pl ? Leg press 4 plates 3 x 10 (eccentric lowering)  ? Decline slope toe raises 20 ? Stairs 7" with 1 HR reciprocally 5RT heel strike ?Prone: TKE 15x 5" ? Contract relax 5x10" for flexion ? Supine: hamstring stretch with rope ? Contract relax for extension 5x10" ?Manual: prone knee hang without weight, roller on hamstring along with manual massage and use of massage gun to relax mm, contract relax ?AROM-7-120 ? ?07/11/21 ?Bike seat 18 5 minutes ?Standing  Knee flexion stretch 10X10" on 12" step ? Hamstring stretch 3X30" on 12" step  ? TKE 4 plates x20 ? Leg press 4 plates 2 x 10  (eccentric lowering)  ? Stairs 7" with 1 HR reciprocally 5RT ?Manual: prone knee hang without weight, roller on hamstring along with manual massage and use of massage gun to relax mm ?PROM for knee extension with overpressure in supine, knee flexion in supine ?AROM :  -9-115 ? ?07/10/21 ?Bike seat 18 5 minutes ?Standing  Knee flexion stretch 10X10" on 12" step ? Hamstring stretch 3X30" on 12" step  ? TKE 4 plates x20 ? Leg press 4 plates 2 x 10 (eccentric lowering)  ? Stairs 7" with 1 HR reciprocally 4RT ?Manual: prone knee hang with 5# for 5 minutes with manual massage and use of massage gun on hamstrings to relax mm ?PROM for knee extension with overpressure in supine, knee flexion in supine ?AROM :  -9-112 ? ?07/03/21 ?Reassess ?Rec bike seat 19 5 min warmup  ?Gait ?Prone hang with 5 lb 3 min  ? ?Manual PROM knee flexion and extension supine, also knee flexion seated at EOB using massage gun on LT quad to inhibit muscle guarding, massage gun to hamstrings in prone with prone hang for knee extension  ? ?(LT knee AROM: -9 to 105 degrees (post manual) ) ?  ? ? ?07/02/21 ?Manual PROM knee flexion and extension  ? ?Prone hang 5lb 3 min  ?Prone quad stretch with strap 3 x30" (educated on contract relax)  ?TKE 4 plates x20 ?Step up 4 inch x20 ?Step down 4 inch x20  ?Leg press 4 plates 2 x 10 (eccentric lowering)  ?Rec bike seat 19 4 min EOS  ? ?(LT knee AROM: -9 to 100 degrees (post manual) ) ? ?  ?PATIENT EDUCATION:  ?Education details: Reviewed goals, educated importance of HEP compliance for maximal benefits, pt able to recall and demonstrate appropriate mechanics. 5/2 on progress, exercise form and POC  ?Person educated: Patient ?Education method: Explanation and Demonstration ?Education comprehension: verbalized understanding and returned demonstration ?  ?  ?HOME EXERCISE PROGRAM: ?07/02/21 ?Prone hang ?Prone quad stretch  ? ? ?Access Code: [email or web address] ?Date: 06/08/2021 supine QS, HS with strap, SLR with QS, ankle pumps, GS,  knee extension mobilization with weight ?  ?ASSESSMENT: ?  ?CLINICAL IMPRESSION: ?Added contract relax for mobility with improved AROM 7-120.  Also added retro gait with bodycraft instructed to complete toe to heel retro and heel to toe forward for gluteal strengthening and quad activation.  Pt tolerated well to sessoin, no reports of increased pain.  Does continues to present with decreased eccentric control while descending stairs. ? ? ?OBJECTIVE IMPAIRMENTS Abnormal gait, decreased activity tolerance, decreased balance, decreased mobility, difficulty walking, decreased ROM, decreased strength, hypomobility, increased edema, increased fascial restrictions, impaired flexibility, improper body mechanics, and pain.  ?  ?ACTIVITY LIMITATIONS cleaning, community activity, driving, meal prep, occupation, laundry, yard work,  shopping, and yard work.  ?  ?  ?GOALS: ?SHORT TERM GOALS: Target date: 06/29/2021 ?  ?Patient will be independent with initial HEP and self-management strategies to improve functional outcomes ?Baseline:  ?Goal status: Achieved ?  ?LONG TERM GOALS: Target date: 07/20/2021 ?  ?Patient will be independent with advanced HEP and self-management strategies to improve functional outcomes ?Baseline:  ?Goal status: Ongoing ?  ?2.  Patient will be able to ambulate at least 400 feet during 2MWT with LRAD to demonstrate improved ability to perform functional mobility and associated tasks. ?Baseline: 370 feet with no AD  ?Goal status: Ongoing ?  ?3.  Patient will have LT knee AROM 0-120 degrees to improve functional mobility and facilitate squatting to pick up items from floor. ?Baseline:  ?Goal status:Ongoing ?  ?4. Patient will have equal to or > 4+/5 MMT throughout LLE to improve ability to perform functional mobility, stair ambulation and ADLs.  ?Baseline: See MMT ?Goal status: MET   ?  ?  ?  ?PLAN: ?PT FREQUENCY: 3x/week ?  ?PT DURATION: 6 weeks ?  ?PLANNED INTERVENTIONS: Therapeutic exercises, Therapeutic  activity, Neuromuscular re-education, Balance training, Gait training, Patient/Family education, and Joint mobilization ?  ?PLAN FOR NEXT SESSION: Progress knee mobility, glute and quad strength as toler

## 2021-07-16 NOTE — Therapy (Signed)
?OUTPATIENT PHYSICAL THERAPY TREATMENT NOTE ? ? ?Patient Name: Luke Chavez ?MRN: [id number] ?DOB:1965/11/08, 56 y.o., male ?Today's Date: 07/17/2021 ? ? ?PCP: Sharilyn Sites, MD ?REFERRING PROVIDER: Carole Civil, MD ? ?END OF SESSION:  ? PT End of Session - 07/17/21 0903   ? ? Visit Number 14   ? Number of Visits 18   ? Date for PT Re-Evaluation 07/20/21   ? Authorization Type BCBS COMM PPO   ? PT Start Time 0900   ? PT Stop Time 0944   ? PT Time Calculation (min) 44 min   ? Activity Tolerance Patient tolerated treatment well   ? Behavior During Therapy [facility] for tasks assessed/performed   ? ?  ?  ? ?  ? ? ? ? ? ? ?Past Medical History:  ?Diagnosis Date  ? Arthritis   ? Chronic back pain   ? Hypertension   ? ?Past Surgical History:  ?Procedure Laterality Date  ? COLONOSCOPY N/A 04/17/2015  ? Procedure: COLONOSCOPY;  Surgeon: Danie Binder, MD;  [facility];  Service: Endoscopy;  Laterality: N/A;  1:00 PM  ? KNEE ARTHROSCOPY WITH MEDIAL MENISECTOMY Left 04/24/2017  ? Procedure: KNEE ARTHROSCOPY WITH MEDIAL MENISECTOMY;  Surgeon: Carole Civil, MD;  Location: [facility];  Service: Orthopedics;  Laterality: Left;  ? TOTAL KNEE ARTHROPLASTY Left 05/25/2021  ? Procedure: TOTAL KNEE ARTHROPLASTY;  Surgeon: Carole Civil, MD;  Location: [facility];  Service: Orthopedics;  Laterality: Left;  ? ?Patient Active Problem List  ? Diagnosis Date Noted  ? Osteoarthritis of left knee 05/25/2021  ? Compression fracture of lumbar spine, non-traumatic ([place]) 07/04/2020  ? Degenerative scoliosis 07/04/2020  ? Lumbar pain 07/04/2020  ? Medial meniscus, posterior horn derangement 05/13/2017  ? S/P left knee arthroscopy 04/24/17   ? Primary osteoarthritis of left knee   ? ? ?REFERRING DIAG: Primary osteoarthritis of left knee  ? ?THERAPY DIAG:  ?Left knee pain, unspecified chronicity ? ?Other symptoms and signs involving the musculoskeletal system ? ?Other abnormalities of gait and mobility ? ?Chronic left shoulder  pain ? ?Stiffness of left knee, not elsewhere classified ?Left knee pain, unspecified chronicity ?  ?Stiffness of left knee, not elsewhere classified ?  ?Other abnormalities of gait and mobility ? ?PERTINENT HISTORY: LT TKA 05/25/21 ? ?PRECAUTIONS: None ? ?SUBJECTIVE:  Knee continues to be stiff.  Reports compliance with HEP daily. ? ?PAIN:  ?Are you having pain? Yes: NPRS scale: 2/10 ?Pain location: Lt knee ?Pain description: stiff and achey ?Aggravating factors: bending ?Relieving factors: CPM ? ? ? ? ?OBJECTIVE:  ? ? ?  ?LE ROM: ?  ?Active ROM Left ?06/08/21 Left ?06/13/21 Left ?06/19/21 Left ?06/21/21 ? Left knee AAROM sitting and supine ?06/25/21 Left  ?AROM ?07/03/21 Left ?07/10/21 ?AROM/ ?AAROM Left ?07/11/21 ?AROM/ ?AAROM Left  ?07/13/21 Left ?AROM ?07/17/21  ?Knee flexion 83 94 95 96 105 105 112/115 115/120 AROM 120 122  ?Knee extension -12 -8 -8 -6 -7 -9 -9 -9 -7 -8  ?   ?LE MMT: ?  ?MMT Right ?06/08/2021 Left ?06/08/2021 Left ?07/03/2021  ?Hip flexion 5 3+ 4+  ?Knee extension 5 3+ 4+  ?Ankle dorsiflexion '5  5 5  ' ? (Blank rows = not tested) ?  ?  ?GAIT: ?At evaluation: Distance walked: 235 feet  ?07/03/21: 370 feet with no AD  ?Assistive device utilized: Environmental consultant - 2 wheeled ?Level of assistance: Modified independence ?Comments: Decreased LT knee and hip flexion ?  ?  ?  ?TODAY'S  TREATMENT: ?07/17/21 ?Bike seat 18 5 minutes ?Standing: Bodycraft walkouts 4Pl x 5 each way ? TKE 20x 10" 5Pl ? Leg press 4 plates 3 x 10 (eccentric lowering)  ? Decline slope toe raises 20 ? Slant board 3 x 20" hold ? ?Cybex 4.5 plates hamstring curls 2 x 10 ? ?Prone knee flexion stretch with strap 20" hold x 10; contract relax ? ?Prone hang 5# x 4' ? ?AROM today after treatment Left knee -8 to 122 ? ? ? ? ? ? ? ? ? ? ? ?07/13/21: ?Bike seat 18 5 minutes ?Standing: Bodycraft retro gait (toe to heel retro, heel to toe forward) 4Pl ? TKE 20x 10" 4Pl ? Leg press 4 plates 3 x 10 (eccentric lowering)  ? Decline slope toe raises 20 ? Stairs 7" with 1 HR  reciprocally 5RT heel strike ?Prone: TKE 15x 5" ? Contract relax 5x10" for flexion ? Supine: hamstring stretch with rope ? Contract relax for extension 5x10" ?Manual: prone knee hang without weight, roller on hamstring along with manual massage and use of massage gun to relax mm, contract relax ?AROM-7-120 ? ?07/11/21 ?Bike seat 18 5 minutes ?Standing  Knee flexion stretch 10X10" on 12" step ? Hamstring stretch 3X30" on 12" step  ? TKE 4 plates x20 ? Leg press 4 plates 2 x 10 (eccentric lowering)  ? Stairs 7" with 1 HR reciprocally 5RT ?Manual: prone knee hang without weight, roller on hamstring along with manual massage and use of massage gun to relax mm ?PROM for knee extension with overpressure in supine, knee flexion in supine ?AROM :  -9-115 ? ?  ?PATIENT EDUCATION:  ?Education details: Reviewed goals, educated importance of HEP compliance for maximal benefits, pt able to recall and demonstrate appropriate mechanics. 5/2 on progress, exercise form and POC  ?Person educated: Patient ?Education method: Explanation and Demonstration ?Education comprehension: verbalized understanding and returned demonstration ?  ?  ?HOME EXERCISE PROGRAM: ?07/02/21 ?Prone hang ?Prone quad stretch  ? ? ?Access Code: [email or web address] ?Date: 06/08/2021 supine QS, HS with strap, SLR with QS, ankle pumps, GS, knee extension mobilization with weight ?  ?ASSESSMENT: ?  ?CLINICAL IMPRESSION: ?Todays session focused on continued strengthening and mobility of the Left knee and lower extremity; added Cybex hamstring curls to treatment to improve strength of knee flexion. Walkouts all 4 ways to improve balance and increase functional strength. Patient with good improvement with knee flexion; continues with slight extension lag but works hard in therapy to address. Patient will benefit from continued skilled therapy interventions to address deficits and improve functional mobility.  ? ? ? ?OBJECTIVE IMPAIRMENTS Abnormal gait, decreased activity  tolerance, decreased balance, decreased mobility, difficulty walking, decreased ROM, decreased strength, hypomobility, increased edema, increased fascial restrictions, impaired flexibility, improper body mechanics, and pain.  ?  ?ACTIVITY LIMITATIONS cleaning, community activity, driving, meal prep, occupation, laundry, yard work, shopping, and yard work.  ?  ?  ?GOALS: ?SHORT TERM GOALS: Target date: 06/29/2021 ?  ?Patient will be independent with initial HEP and self-management strategies to improve functional outcomes ?Baseline:  ?Goal status: Achieved ?  ?LONG TERM GOALS: Target date: 07/20/2021 ?  ?Patient will be independent with advanced HEP and self-management strategies to improve functional outcomes ?Baseline:  ?Goal status: Ongoing ?  ?2.  Patient will be able to ambulate at least 400 feet during 2MWT with LRAD to demonstrate improved ability to perform functional mobility and associated tasks. ?Baseline: 370 feet with no AD  ?Goal status: Ongoing ?  ?3.  Patient will have LT knee AROM 0-120 degrees to improve functional mobility and facilitate squatting to pick up items from floor. ?Baseline:  ?Goal status:Ongoing ?  ?4. Patient will have equal to or > 4+/5 MMT throughout LLE to improve ability to perform functional mobility, stair ambulation and ADLs.  ?Baseline: See MMT ?Goal status: MET   ?  ?  ?  ?PLAN: ?PT FREQUENCY: 3x/week ?  ?PT DURATION: 6 weeks ?  ?PLANNED INTERVENTIONS: Therapeutic exercises, Therapeutic activity, Neuromuscular re-education, Balance training, Gait training, Patient/Family education, and Joint mobilization ?  ?PLAN FOR NEXT SESSION: Progress knee mobility, glute and quad strength as tolerated. Manual as needed for improving ROM. Patient getting close to achieving all set rehab goals.  ? ?9:35 AM, 07/17/21 ?Schneider Warchol Small Retia Cordle MPT ?[facility] ?[place] [phone] ?[phone] ? ?

## 2021-07-17 ENCOUNTER — Ambulatory Visit (HOSPITAL_COMMUNITY): Payer: BC Managed Care – PPO

## 2021-07-17 DIAGNOSIS — M25562 Pain in left knee: Secondary | ICD-10-CM

## 2021-07-17 DIAGNOSIS — M25662 Stiffness of left knee, not elsewhere classified: Secondary | ICD-10-CM | POA: Diagnosis not present

## 2021-07-17 DIAGNOSIS — G8929 Other chronic pain: Secondary | ICD-10-CM

## 2021-07-17 DIAGNOSIS — R29898 Other symptoms and signs involving the musculoskeletal system: Secondary | ICD-10-CM | POA: Diagnosis not present

## 2021-07-17 DIAGNOSIS — R2689 Other abnormalities of gait and mobility: Secondary | ICD-10-CM

## 2021-07-17 DIAGNOSIS — M25512 Pain in left shoulder: Secondary | ICD-10-CM | POA: Diagnosis not present

## 2021-07-19 ENCOUNTER — Encounter (HOSPITAL_COMMUNITY): Payer: BC Managed Care – PPO | Admitting: Physical Therapy

## 2021-07-19 ENCOUNTER — Ambulatory Visit (HOSPITAL_COMMUNITY): Payer: BC Managed Care – PPO | Admitting: Physical Therapy

## 2021-07-19 ENCOUNTER — Encounter (HOSPITAL_COMMUNITY): Payer: Self-pay | Admitting: Physical Therapy

## 2021-07-19 DIAGNOSIS — M25512 Pain in left shoulder: Secondary | ICD-10-CM | POA: Diagnosis not present

## 2021-07-19 DIAGNOSIS — G8929 Other chronic pain: Secondary | ICD-10-CM | POA: Diagnosis not present

## 2021-07-19 DIAGNOSIS — M25662 Stiffness of left knee, not elsewhere classified: Secondary | ICD-10-CM | POA: Diagnosis not present

## 2021-07-19 DIAGNOSIS — M25562 Pain in left knee: Secondary | ICD-10-CM

## 2021-07-19 DIAGNOSIS — R29898 Other symptoms and signs involving the musculoskeletal system: Secondary | ICD-10-CM

## 2021-07-19 DIAGNOSIS — R2689 Other abnormalities of gait and mobility: Secondary | ICD-10-CM | POA: Diagnosis not present

## 2021-07-19 NOTE — Therapy (Signed)
OUTPATIENT PHYSICAL THERAPY TREATMENT NOTE   Patient Name: Luke Chavez MRN: [id number] DOB:06-Jul-1965, 56 y.o., male Today's Date: 07/19/2021 Progress Note Reporting Period 06/08/21 to 07/19/21  See note below for Objective Data and Assessment of Progress/Goals.      PCP: Sharilyn Sites, MD REFERRING PROVIDER: Carole Civil, MD  END OF SESSION:   PT End of Session - 07/19/21 1647     Visit Number 15    Number of Visits 23    Date for PT Re-Evaluation 08/16/21    Authorization Type BCBS COMM PPO    PT Start Time 1645    PT Stop Time 1726    PT Time Calculation (min) 41 min    Activity Tolerance Patient tolerated treatment well    Behavior During Therapy WFL for tasks assessed/performed                 Past Medical History:  Diagnosis Date   Arthritis    Chronic back pain    Hypertension    Past Surgical History:  Procedure Laterality Date   COLONOSCOPY N/A 04/17/2015   Procedure: COLONOSCOPY;  Surgeon: Danie Binder, MD;  [facility];  Service: Endoscopy;  Laterality: N/A;  1:00 PM   KNEE ARTHROSCOPY WITH MEDIAL MENISECTOMY Left 04/24/2017   Procedure: KNEE ARTHROSCOPY WITH MEDIAL MENISECTOMY;  Surgeon: Carole Civil, MD;  Location: [facility];  Service: Orthopedics;  Laterality: Left;   TOTAL KNEE ARTHROPLASTY Left 05/25/2021   Procedure: TOTAL KNEE ARTHROPLASTY;  Surgeon: Carole Civil, MD;  Location: [facility];  Service: Orthopedics;  Laterality: Left;   Patient Active Problem List   Diagnosis Date Noted   Osteoarthritis of left knee 05/25/2021   Compression fracture of lumbar spine, non-traumatic ([place]) 07/04/2020   Degenerative scoliosis 07/04/2020   Lumbar pain 07/04/2020   Medial meniscus, posterior horn derangement 05/13/2017   S/P left knee arthroscopy 04/24/17    Primary osteoarthritis of left knee     REFERRING DIAG: Primary osteoarthritis of left knee   THERAPY DIAG:  Left knee pain, unspecified chronicity  Other  symptoms and signs involving the musculoskeletal system  Other abnormalities of gait and mobility Left knee pain, unspecified chronicity   Stiffness of left knee, not elsewhere classified   Other abnormalities of gait and mobility  PERTINENT HISTORY: LT TKA 05/25/21  PRECAUTIONS: None  SUBJECTIVE: Feels better. Still has some swelling. Has been doing HEP regularly. He reports 65% improvement overall.    PAIN:  Are you having pain? Yes: NPRS scale: 2/10 Pain location: Lt knee Pain description: stiff and achey Aggravating factors: bending Relieving factors: CPM     OBJECTIVE:      LE ROM:   Active ROM Left 06/08/21 Left 06/13/21 Left 06/19/21 Left 06/21/21  Left knee AAROM sitting and supine 06/25/21 Left  AROM 07/03/21 Left 07/10/21 AROM/ AAROM Left 07/11/21 AROM/ AAROM Left  07/13/21 Left AROM 07/17/21 Left AROM 07/19/21  Knee flexion 83 94 95 96 105 105 112/115 115/120 AROM 120 122 116  Knee extension -12 -8 -8 -6 -7 -9 -9 -9 -7 -8 -6     LE MMT:   MMT Right 06/08/2021 Left 06/08/2021 Left 07/03/2021 Left 07/19/2021  Hip flexion 5 3+ 4+ 5  Knee extension 5 3+ 4+ 5  Ankle dorsiflexion _0 (Blank rows = not tested)     GAIT: Slight decreased stride on LT, non antalgic, no AD      TODAY'S TREATMENT:  07/19/21 Bike 4 min AROM warmup  Knee driver 2nd step 10 x 5" Standing hamstring stretch on 2nd step 5 x 10" Stairs 7 inch reciprocal 5RT starts with HHA x 1 but ends with no HHA  Single leg balance 2 x 15"  2MWT 475 feet with AD  Hamstring cuel 5 plates 3 x 10 (eccentric lowering) TKE 5 plates 3 x 10     6/97/94 Bike seat 18 5 minutes Standing: Bodycraft walkouts 4Pl x 5 each way  TKE 20x 10" 5Pl  Leg press 4 plates 3 x 10 (eccentric lowering)   Decline slope toe raises 20  Slant board 3 x 20" hold  Cybex 4.5 plates hamstring curls 2 x 10  Prone knee flexion stretch with strap 20" hold x 10; contract relax  Prone hang 5# x 4'  AROM today  after treatment Left knee -8 to 122      PATIENT EDUCATION:  Education details: Reviewed goals, educated importance of HEP compliance for maximal benefits, pt able to recall and demonstrate appropriate mechanics. 5/2 on progress, exercise form and POC  Person educated: Patient Education method: Explanation and Demonstration Education comprehension: verbalized understanding and returned demonstration     HOME EXERCISE PROGRAM: 07/02/21 Prone hang Prone quad stretch    Access Code: [email or web address] Date: 06/08/2021 supine QS, HS with strap, SLR with QS, ankle pumps, GS, knee extension mobilization with weight   ASSESSMENT:   CLINICAL IMPRESSION: Patient progressing well to therapy goals. Showing very good strength, improved gait and fairly good balance. Patient remains limited by low level pain, notably with prolonged activity, still has some mild swelling and slight AROM restrictions. Knee extension has improved significantly in recent visits. Patient will continue to benefit from skilled Therapy services to address remaining deficits for reduced pain and swelling and improved LOF with ADLs, and eventual return to work.   OBJECTIVE IMPAIRMENTS Abnormal gait, decreased activity tolerance, decreased balance, decreased mobility, difficulty walking, decreased ROM, decreased strength, hypomobility, increased edema, increased fascial restrictions, impaired flexibility, improper body mechanics, and pain.    ACTIVITY LIMITATIONS cleaning, community activity, driving, meal prep, occupation, laundry, yard work, shopping, and yard work.      GOALS: SHORT TERM GOALS: Target date: 06/29/2021   Patient will be independent with initial HEP and self-management strategies to improve functional outcomes Baseline:  Goal status: Achieved   LONG TERM GOALS: Target date: 07/20/2021   Patient will be independent with advanced HEP and self-management strategies to improve functional outcomes Baseline:  Goal  status: Ongoing   2.  Patient will be able to ambulate at least 400 feet during 2MWT with LRAD to demonstrate improved ability to perform functional mobility and associated tasks. Baseline: 475 feet with no AD  Goal status: Ongoing   3.  Patient will have LT knee AROM 0-120 degrees to improve functional mobility and facilitate squatting to pick up items from floor. Baseline: -6 to 116 degrees Goal status:Ongoing   4. Patient will have equal to or > 4+/5 MMT throughout LLE to improve ability to perform functional mobility, stair ambulation and ADLs.  Baseline: See MMT Goal status: MET         PLAN: PT FREQUENCY: 1-2x/week   PT DURATION: 3 weeks   PLANNED INTERVENTIONS: Therapeutic exercises, Therapeutic activity, Neuromuscular re-education, Balance training, Gait training, Patient/Family education, and Joint mobilization   PLAN FOR NEXT SESSION: ROM, more progressive strengthening and challenges to balance. Functional activity like band sidestep, lunges for kneeling, heavy TKE, sled push/  pull, possibly box lifts.   5:27 PM, 07/19/21 Josue Hector PT DPT  Physical Therapist with [facility]  [phone]

## 2021-07-23 ENCOUNTER — Ambulatory Visit (HOSPITAL_COMMUNITY): Payer: BC Managed Care – PPO | Admitting: Physical Therapy

## 2021-07-23 DIAGNOSIS — R2689 Other abnormalities of gait and mobility: Secondary | ICD-10-CM | POA: Diagnosis not present

## 2021-07-23 DIAGNOSIS — M25562 Pain in left knee: Secondary | ICD-10-CM | POA: Diagnosis not present

## 2021-07-23 DIAGNOSIS — G8929 Other chronic pain: Secondary | ICD-10-CM | POA: Diagnosis not present

## 2021-07-23 DIAGNOSIS — R29898 Other symptoms and signs involving the musculoskeletal system: Secondary | ICD-10-CM

## 2021-07-23 DIAGNOSIS — M25662 Stiffness of left knee, not elsewhere classified: Secondary | ICD-10-CM | POA: Diagnosis not present

## 2021-07-23 DIAGNOSIS — M25512 Pain in left shoulder: Secondary | ICD-10-CM | POA: Diagnosis not present

## 2021-07-23 NOTE — Therapy (Signed)
OUTPATIENT PHYSICAL THERAPY TREATMENT NOTE   Patient Name: Luke Chavez MRN: [id number] DOB:1965/04/04, 56 y.o., male Today's Date: 07/23/2021   PCP: Sharilyn Sites, MD REFERRING PROVIDER: Carole Civil, MD  END OF SESSION:   PT End of Session - 07/23/21 1423     Visit Number 16    Number of Visits 23    Date for PT Re-Evaluation 08/16/21    Authorization Type BCBS COMM PPO    PT Start Time 1405    PT Stop Time 1445    PT Time Calculation (min) 40 min    Activity Tolerance Patient tolerated treatment well    Behavior During Therapy WFL for tasks assessed/performed                 Past Medical History:  Diagnosis Date   Arthritis    Chronic back pain    Hypertension    Past Surgical History:  Procedure Laterality Date   COLONOSCOPY N/A 04/17/2015   Procedure: COLONOSCOPY;  Surgeon: Danie Binder, MD;  [facility];  Service: Endoscopy;  Laterality: N/A;  1:00 PM   KNEE ARTHROSCOPY WITH MEDIAL MENISECTOMY Left 04/24/2017   Procedure: KNEE ARTHROSCOPY WITH MEDIAL MENISECTOMY;  Surgeon: Carole Civil, MD;  Location: [facility];  Service: Orthopedics;  Laterality: Left;   TOTAL KNEE ARTHROPLASTY Left 05/25/2021   Procedure: TOTAL KNEE ARTHROPLASTY;  Surgeon: Carole Civil, MD;  Location: [facility];  Service: Orthopedics;  Laterality: Left;   Patient Active Problem List   Diagnosis Date Noted   Osteoarthritis of left knee 05/25/2021   Compression fracture of lumbar spine, non-traumatic ([place]) 07/04/2020   Degenerative scoliosis 07/04/2020   Lumbar pain 07/04/2020   Medial meniscus, posterior horn derangement 05/13/2017   S/P left knee arthroscopy 04/24/17    Primary osteoarthritis of left knee     REFERRING DIAG: Primary osteoarthritis of left knee   THERAPY DIAG:  No diagnosis found. Left knee pain, unspecified chronicity   Stiffness of left knee, not elsewhere classified   Other abnormalities of gait and mobility  PERTINENT  HISTORY: LT TKA 05/25/21  PRECAUTIONS: None  SUBJECTIVE: pt states he only has minimal discomfort, not really pain.  States his pain at night is the worst and wakes him up. Still has some swelling and wore his compression some over the weekend.  Educated to wear it daily.    PAIN:  Are you having pain? Yes: NPRS scale: 2/10 Pain location: Lt knee Pain description: stiff and achey Aggravating factors: bending Relieving factors: CPM     OBJECTIVE:      LE ROM:   Active ROM Left 06/08/21 Left 06/13/21 Left 06/19/21 Left 06/21/21  Left knee AAROM sitting and supine 06/25/21 Left  AROM 07/03/21 Left 07/10/21 AROM/ AAROM Left 07/11/21 AROM/ AAROM Left  07/13/21 Left AROM 07/17/21 Left AROM 07/19/21  Knee flexion 83 94 95 96 105 105 112/115 115/120 AROM 120 122 116  Knee extension -12 -8 -8 -6 -7 -9 -9 -9 -7 -8 -6     LE MMT:   MMT Right 06/08/2021 Left 06/08/2021 Left 07/03/2021 Left 07/19/2021  Hip flexion 5 3+ 4+ 5  Knee extension 5 3+ 4+ 5  Ankle dorsiflexion _0 (Blank rows = not tested)     GAIT: Slight decreased stride on LT, non antalgic, no AD      TODAY'S TREATMENT: 07/23/21 Bike seat 16 5 minutes Standing:  Heel/toe raises 20X Knee flexion on  12" step 10X10" holds Hamstring stretch 3X30" onto 12" step Bodycraft walkouts 5Pl x 5 each way  TKE 5Pl 5PL with bodycraft  Slant board 3 x 20" hold  Stair negotiation 5Rt 7" step with 1 HHA Seated:  Leg press 5 plates 3 x 10 (eccentric lowering)   Cybex hamstring curl 6PL 2X10  07/19/21 Bike 4 min AROM warmup  Knee driver 2nd step 10 x 5" Standing hamstring stretch on 2nd step 5 x 10" Stairs 7 inch reciprocal 5RT starts with HHA x 1 but ends with no HHA  Single leg balance 2 x 15"  2MWT 475 feet with AD  Hamstring cuel 5 plates 3 x 10 (eccentric lowering) TKE 5 plates 3 x 10   04/30/31 Bike seat 18 5 minutes Standing: Bodycraft walkouts 4Pl x 5 each way  TKE 20x 10" 5Pl  Leg press 4 plates 3 x 10  (eccentric lowering)   Decline slope toe raises 20  Slant board 3 x 20" hold  Cybex 4.5 plates hamstring curls 2 x 10  Prone knee flexion stretch with strap 20" hold x 10; contract relax  Prone hang 5# x 4'  AROM today after treatment Left knee -8 to 122      PATIENT EDUCATION:  Education details: Reviewed goals, educated importance of HEP compliance for maximal benefits, pt able to recall and demonstrate appropriate mechanics. 5/2 on progress, exercise form and POC  Person educated: Patient Education method: Explanation and Demonstration Education comprehension: verbalized understanding and returned demonstration     HOME EXERCISE PROGRAM: 07/02/21 Prone hang Prone quad stretch    Access Code: [email or web address] Date: 06/08/2021 supine QS, HS with strap, SLR with QS, ankle pumps, GS, knee extension mobilization with weight   ASSESSMENT:   CLINICAL IMPRESSION: Educated to wear his compression daily, putting on first thing in the morning and removing in the evening for best results.  Pt verbalized understanding.  Continued to work on remaining deficits in strength and ROM. Able to complete bike with seat 2 levels closer.  Increased to 5 PL with walkouts and leg press also without LOB and only minor gait deviations. Stairs continue to be a challenge but able to complete reciprocally without handrails.  Pt will continue to benefit from skilled therapy to achieve reduced pain and swelling and improved LOF with ADLs, and eventual return to work.   OBJECTIVE IMPAIRMENTS Abnormal gait, decreased activity tolerance, decreased balance, decreased mobility, difficulty walking, decreased ROM, decreased strength, hypomobility, increased edema, increased fascial restrictions, impaired flexibility, improper body mechanics, and pain.    ACTIVITY LIMITATIONS cleaning, community activity, driving, meal prep, occupation, laundry, yard work, shopping, and yard work.      GOALS: SHORT TERM GOALS: Target  date: 06/29/2021   Patient will be independent with initial HEP and self-management strategies to improve functional outcomes Baseline:  Goal status: Achieved   LONG TERM GOALS: Target date: 07/20/2021   Patient will be independent with advanced HEP and self-management strategies to improve functional outcomes Baseline:  Goal status: Ongoing   2.  Patient will be able to ambulate at least 400 feet during 2MWT with LRAD to demonstrate improved ability to perform functional mobility and associated tasks. Baseline: 475 feet with no AD  Goal status: Ongoing   3.  Patient will have LT knee AROM 0-120 degrees to improve functional mobility and facilitate squatting to pick up items from floor. Baseline: -6 to 116 degrees Goal status:Ongoing   4. Patient will have equal to or >  4+/5 MMT throughout LLE to improve ability to perform functional mobility, stair ambulation and ADLs.  Baseline: See MMT Goal status: MET         PLAN: PT FREQUENCY: 1-2x/week   PT DURATION: 3 weeks   PLANNED INTERVENTIONS: Therapeutic exercises, Therapeutic activity, Neuromuscular re-education, Balance training, Gait training, Patient/Family education, and Joint mobilization   PLAN FOR NEXT SESSION: ROM, more progressive strengthening and challenges to balance. Functional activity like band sidestep, lunges for kneeling, heavy TKE, sled push/ pull, possibly box lifts.   2:24 PM, 07/23/21 Teena Irani, PTA/CLT [place] Ph: [phone]

## 2021-07-24 ENCOUNTER — Encounter (HOSPITAL_COMMUNITY): Payer: BC Managed Care – PPO

## 2021-07-26 ENCOUNTER — Encounter (HOSPITAL_COMMUNITY): Payer: BC Managed Care – PPO

## 2021-07-27 ENCOUNTER — Encounter (HOSPITAL_COMMUNITY): Payer: BC Managed Care – PPO

## 2021-07-31 ENCOUNTER — Encounter (HOSPITAL_COMMUNITY): Payer: Self-pay | Admitting: Physical Therapy

## 2021-07-31 ENCOUNTER — Ambulatory Visit (HOSPITAL_COMMUNITY): Payer: BC Managed Care – PPO | Attending: Family Medicine | Admitting: Physical Therapy

## 2021-07-31 DIAGNOSIS — R29898 Other symptoms and signs involving the musculoskeletal system: Secondary | ICD-10-CM | POA: Diagnosis not present

## 2021-07-31 DIAGNOSIS — M25662 Stiffness of left knee, not elsewhere classified: Secondary | ICD-10-CM

## 2021-07-31 DIAGNOSIS — M25562 Pain in left knee: Secondary | ICD-10-CM | POA: Diagnosis not present

## 2021-07-31 DIAGNOSIS — R2689 Other abnormalities of gait and mobility: Secondary | ICD-10-CM | POA: Insufficient documentation

## 2021-07-31 NOTE — Therapy (Signed)
OUTPATIENT PHYSICAL THERAPY TREATMENT NOTE   Patient Name: GEN CLAGG MRN: [id number] DOB:07-Jan-1966, 56 y.o., male Today's Date: 07/31/2021   PCP: Sharilyn Sites, MD REFERRING PROVIDER: Carole Civil, MD  END OF SESSION:   PT End of Session - 07/31/21 1104     Visit Number 17    Number of Visits 23    Date for PT Re-Evaluation 08/16/21    Authorization Type BCBS COMM PPO    PT Start Time 1053    PT Stop Time 1140    PT Time Calculation (min) 47 min    Activity Tolerance Patient tolerated treatment well    Behavior During Therapy WFL for tasks assessed/performed                 Past Medical History:  Diagnosis Date   Arthritis    Chronic back pain    Hypertension    Past Surgical History:  Procedure Laterality Date   COLONOSCOPY N/A 04/17/2015   Procedure: COLONOSCOPY;  Surgeon: Danie Binder, MD;  [facility];  Service: Endoscopy;  Laterality: N/A;  1:00 PM   KNEE ARTHROSCOPY WITH MEDIAL MENISECTOMY Left 04/24/2017   Procedure: KNEE ARTHROSCOPY WITH MEDIAL MENISECTOMY;  Surgeon: Carole Civil, MD;  Location: [facility];  Service: Orthopedics;  Laterality: Left;   TOTAL KNEE ARTHROPLASTY Left 05/25/2021   Procedure: TOTAL KNEE ARTHROPLASTY;  Surgeon: Carole Civil, MD;  Location: [facility];  Service: Orthopedics;  Laterality: Left;   Patient Active Problem List   Diagnosis Date Noted   Osteoarthritis of left knee 05/25/2021   Compression fracture of lumbar spine, non-traumatic ([facility]) 07/04/2020   Degenerative scoliosis 07/04/2020   Lumbar pain 07/04/2020   Medial meniscus, posterior horn derangement 05/13/2017   S/P left knee arthroscopy 04/24/17    Primary osteoarthritis of left knee     REFERRING DIAG: Primary osteoarthritis of left knee   THERAPY DIAG:  Left knee pain, unspecified chronicity  Other symptoms and signs involving the musculoskeletal system  Other abnormalities of gait and mobility  Stiffness of left knee,  not elsewhere classified Left knee pain, unspecified chronicity   Stiffness of left knee, not elsewhere classified   Other abnormalities of gait and mobility  PERTINENT HISTORY: LT TKA 05/25/21  PRECAUTIONS: None  SUBJECTIVE: Pt states that he has more stiffness than anything else.      PAIN:  Are you having pain? Yes: NPRS scale: 2/10 Pain location: Lt knee Pain description: stiff and achey Aggravating factors: bending Relieving factors: CPM     OBJECTIVE:      LE ROM:   Active ROM Left 06/08/21 Left 06/13/21 Left 06/19/21 Left 06/21/21  Left knee AAROM sitting and supine 06/25/21 Left  AROM 07/03/21 Left 07/10/21 AROM/ AAROM Left 07/11/21 AROM/ AAROM Left  07/13/21 Left AROM 07/17/21 Left AROM 07/19/21 Left AROM 07/31/21  Knee flexion 83 94 95 96 105 105 112/115 115/120 AROM 120 122 116 123  Knee extension -12 -8 -8 -6 -7 -9 -9 -9 -7 -8 -6 -4      LE MMT:   MMT Right 06/08/2021 Left 06/08/2021 Left 07/03/2021 Left 07/19/2021  Hip flexion 5 3+ 4+ 5  Knee extension 5 3+ 4+ 5  Ankle dorsiflexion '5  5 5 5   ' (Blank rows = not tested)     GAIT: Slight decreased stride on LT, non antalgic, no AD      TODAY'S TREATMENT: 07/31/21 Upright bike x 5 Standing: Heel raises 15 Knee flexion  x 10 Terminal knee extension 10 Prone: Knee flexion 5 Quad stretch 3 for 30 seconds Contract relax x 3 Terminal extension x 10  Supine: Hamstring stretch with active flexion x 5 Bridge holding 10" x 10 Heel slide with active extension x 10  SAQx 10 Quad set x 10  PROM x 3  07/23/21 Bike seat 16 5 minutes Standing:  Heel/toe raises 20X Knee flexion on 12" step 10X10" holds Hamstring stretch 3X30" onto 12" step Bodycraft walkouts 5Pl x 5 each way  TKE 5Pl 5PL with bodycraft  Slant board 3 x 20" hold  Stair negotiation 5Rt 7" step with 1 HHA Seated:  Leg press 5 plates 3 x 10 (eccentric lowering)   Cybex hamstring curl 6PL 2X10  07/19/21 Bike 4 min AROM warmup  Knee  driver 2nd step 10 x 5" Standing hamstring stretch on 2nd step 5 x 10" Stairs 7 inch reciprocal 5RT starts with HHA x 1 but ends with no HHA  Single leg balance 2 x 15"  2MWT 475 feet with AD  Hamstring cuel 5 plates 3 x 10 (eccentric lowering) TKE 5 plates 3 x 10   06/26/93 Bike seat 18 5 minutes Standing: Bodycraft walkouts 4Pl x 5 each way  TKE 20x 10" 5Pl  Leg press 4 plates 3 x 10 (eccentric lowering)   Decline slope toe raises 20  Slant board 3 x 20" hold  Cybex 4.5 plates hamstring curls 2 x 10  Prone knee flexion stretch with strap 20" hold x 10; contract relax  Prone hang 5# x 4'  AROM today after treatment Left knee -8 to 122      PATIENT EDUCATION:  Education details: Reviewed goals, educated importance of HEP compliance for maximal benefits, pt able to recall and demonstrate appropriate mechanics. 5/2 on progress, exercise form and POC  Person educated: Patient Education method: Explanation and Demonstration Education comprehension: verbalized understanding and returned demonstration     HOME EXERCISE PROGRAM: 07/02/21 Prone hang Prone quad stretch    Access Code: [email or web address] Date: 06/08/2021 supine QS, HS with strap, SLR with QS, ankle pumps, GS, knee extension mobilization with weight   ASSESSMENT:   CLINICAL IMPRESSION: Pt responded well to contract relax and PROM with pt having the best ROM today that he has had since surgery.  Minimal swelling noted at this time.  Pt has a soft end feel with both extension and flexion.  Pt will continue to benefit from skilled therapy to improve extension and gluteal strength.  OBJECTIVE IMPAIRMENTS Abnormal gait, decreased activity tolerance, decreased balance, decreased mobility, difficulty walking, decreased ROM, decreased strength, hypomobility, increased edema, increased fascial restrictions, impaired flexibility, improper body mechanics, and pain.    ACTIVITY LIMITATIONS cleaning, community activity, driving, meal  prep, occupation, laundry, yard work, shopping, and yard work.      GOALS: SHORT TERM GOALS: Target date: 06/29/2021   Patient will be independent with initial HEP and self-management strategies to improve functional outcomes Baseline:  Goal status: Achieved   LONG TERM GOALS: Target date: 07/20/2021   Patient will be independent with advanced HEP and self-management strategies to improve functional outcomes Baseline:  Goal status: Ongoing   2.  Patient will be able to ambulate at least 400 feet during 2MWT with LRAD to demonstrate improved ability to perform functional mobility and associated tasks. Baseline: 475 feet with no AD  Goal status: Ongoing   3.  Patient will have LT knee AROM 0-120 degrees to improve functional mobility and facilitate  squatting to pick up items from floor. Baseline: -6 to 116 degrees Goal status:Ongoing   4. Patient will have equal to or > 4+/5 MMT throughout LLE to improve ability to perform functional mobility, stair ambulation and ADLs.  Baseline: See MMT Goal status: MET         PLAN: PT FREQUENCY: 1-2x/week   PT DURATION: 3 weeks   PLANNED INTERVENTIONS: Therapeutic exercises, Therapeutic activity, Neuromuscular re-education, Balance training, Gait training, Patient/Family education, and Joint mobilization   PLAN FOR NEXT SESSION: ROM, more progressive strengthening and challenges to balance. Functional activity like band sidestep, lunges for kneeling, heavy TKE, sled push/ pull, possibly box lifts.   11:40 AM, 07/31/21 Rayetta Humphrey, PT CLT [phone]

## 2021-08-06 ENCOUNTER — Encounter: Payer: Self-pay | Admitting: Orthopedic Surgery

## 2021-08-06 ENCOUNTER — Ambulatory Visit (INDEPENDENT_AMBULATORY_CARE_PROVIDER_SITE_OTHER): Payer: BC Managed Care – PPO | Admitting: Orthopedic Surgery

## 2021-08-06 DIAGNOSIS — Z96652 Presence of left artificial knee joint: Secondary | ICD-10-CM

## 2021-08-06 NOTE — Progress Notes (Signed)
Chief Complaint  Patient presents with   Follow-up    Recheck on left TKA, DOS 05-25-21.   3 MONTHS POST OP   -4 TO 120 ROM   PAIN CONTROLLED WELL  STILL OOW FOR 3 WEEKS   RET WORK 3 WEEKS   FU 3 MONTHS

## 2021-08-06 NOTE — Patient Instructions (Signed)
RTW 3 WEEKS

## 2021-11-07 ENCOUNTER — Ambulatory Visit (INDEPENDENT_AMBULATORY_CARE_PROVIDER_SITE_OTHER): Payer: BC Managed Care – PPO | Admitting: Orthopedic Surgery

## 2021-11-07 DIAGNOSIS — Z96652 Presence of left artificial knee joint: Secondary | ICD-10-CM | POA: Diagnosis not present

## 2021-11-07 NOTE — Progress Notes (Signed)
Chief Complaint  Patient presents with   Knee Problem    LEFT TKA DOS 05/25/21 still a little stiff   Coner is doing well 6 months postop he has some stiffness in the morning that goes away he is having some trouble kneeling but that is expected  He has some other joint complaints which he may get checked on soon  Otherwise we will see him in 6 months for his annual x-ray

## 2022-05-02 ENCOUNTER — Encounter: Payer: Self-pay | Admitting: Radiology

## 2022-05-07 DIAGNOSIS — M542 Cervicalgia: Secondary | ICD-10-CM | POA: Diagnosis not present

## 2022-05-07 DIAGNOSIS — M25511 Pain in right shoulder: Secondary | ICD-10-CM | POA: Insufficient documentation

## 2022-05-07 DIAGNOSIS — M47812 Spondylosis without myelopathy or radiculopathy, cervical region: Secondary | ICD-10-CM | POA: Diagnosis not present

## 2022-05-09 ENCOUNTER — Ambulatory Visit: Payer: BC Managed Care – PPO | Admitting: Orthopedic Surgery

## 2022-05-13 ENCOUNTER — Ambulatory Visit (INDEPENDENT_AMBULATORY_CARE_PROVIDER_SITE_OTHER): Payer: BC Managed Care – PPO

## 2022-05-13 ENCOUNTER — Ambulatory Visit: Payer: BC Managed Care – PPO | Admitting: Orthopedic Surgery

## 2022-05-13 DIAGNOSIS — Z96652 Presence of left artificial knee joint: Secondary | ICD-10-CM

## 2022-05-13 DIAGNOSIS — M1712 Unilateral primary osteoarthritis, left knee: Secondary | ICD-10-CM

## 2022-05-13 NOTE — Progress Notes (Unsigned)
Chief Complaint  Patient presents with   Post-op Follow-up    Left knee xrays today    Annual follow-up status post total knee  Encounter Diagnoses  Name Primary?   Status post total left knee replacement 05/25/21 Yes   Unilateral primary osteoarthritis, left knee     He is doing well no complaints he would like to get his other knee done in August  His motion is excellent the knee is stable in extension and flexion his x-ray shows some mild patellar tilt no evidence of loosening of the implant  Impression stable postop total knee at 1 year follow-up in a year for x-ray

## 2022-06-14 DIAGNOSIS — M542 Cervicalgia: Secondary | ICD-10-CM | POA: Diagnosis not present

## 2022-06-21 DIAGNOSIS — M5412 Radiculopathy, cervical region: Secondary | ICD-10-CM | POA: Diagnosis not present

## 2022-06-26 DIAGNOSIS — M5412 Radiculopathy, cervical region: Secondary | ICD-10-CM | POA: Diagnosis not present

## 2022-06-28 DIAGNOSIS — M5412 Radiculopathy, cervical region: Secondary | ICD-10-CM | POA: Diagnosis not present

## 2022-07-03 DIAGNOSIS — M5412 Radiculopathy, cervical region: Secondary | ICD-10-CM | POA: Diagnosis not present

## 2022-07-05 DIAGNOSIS — M5412 Radiculopathy, cervical region: Secondary | ICD-10-CM | POA: Diagnosis not present

## 2022-07-10 DIAGNOSIS — M5412 Radiculopathy, cervical region: Secondary | ICD-10-CM | POA: Diagnosis not present

## 2022-07-12 DIAGNOSIS — M5412 Radiculopathy, cervical region: Secondary | ICD-10-CM | POA: Diagnosis not present

## 2022-07-17 DIAGNOSIS — M5412 Radiculopathy, cervical region: Secondary | ICD-10-CM | POA: Diagnosis not present

## 2022-07-19 DIAGNOSIS — M5412 Radiculopathy, cervical region: Secondary | ICD-10-CM | POA: Diagnosis not present

## 2022-08-06 DIAGNOSIS — M542 Cervicalgia: Secondary | ICD-10-CM | POA: Diagnosis not present

## 2022-08-12 ENCOUNTER — Ambulatory Visit: Payer: BC Managed Care – PPO | Admitting: Orthopedic Surgery

## 2022-08-20 DIAGNOSIS — M5412 Radiculopathy, cervical region: Secondary | ICD-10-CM | POA: Diagnosis not present

## 2022-08-27 DIAGNOSIS — M542 Cervicalgia: Secondary | ICD-10-CM | POA: Diagnosis not present

## 2022-09-12 DIAGNOSIS — M5412 Radiculopathy, cervical region: Secondary | ICD-10-CM | POA: Diagnosis not present

## 2022-11-25 ENCOUNTER — Ambulatory Visit: Payer: BC Managed Care – PPO | Admitting: Orthopedic Surgery

## 2022-11-25 ENCOUNTER — Encounter: Payer: Self-pay | Admitting: Orthopedic Surgery

## 2022-11-25 VITALS — BP 153/98 | HR 84

## 2022-11-25 DIAGNOSIS — R202 Paresthesia of skin: Secondary | ICD-10-CM | POA: Diagnosis not present

## 2022-11-25 NOTE — Patient Instructions (Signed)
We are referring you to [facility] from [facility] address is [address] [place] The phone number is [phone]  The office will call you with an appointment Dr. Alvester Morin will do the nerve study

## 2022-11-25 NOTE — Progress Notes (Signed)
Office Visit Note   Patient: Luke Chavez           Date of Birth: 03-22-1965           MRN: [id number] Visit Date: 11/25/2022 Requested by: Assunta Found, MD [address] [place],  [place] [address] PCP: Assunta Found, MD   Assessment & Plan:   Encounter Diagnosis  Name Primary?   Right hand paresthesia Yes    No orders of the defined types were placed in this encounter.   57 year old male with cervical disc pathology status post epidural injection x 1 for right upper extremity paresthesias and numbness and tingling at emerge atypical symptoms of carpal tunnel perhaps right upper extremity with numbness and tingling of the entire hand occasional cramping of the long and ring finger  Recommend nerve conduction study to delineate cervical disc pathology versus carpal tunnel pathology   Subjective: Chief Complaint  Patient presents with   Hand Injury    Right hand pain     HPI: 57 year old male presents with atypical symptoms for carpal tunnel in the right upper extremity with numbness and tingling of his right hand arm radiating from his neck.  He had 1 epidural injection which improved the tingling in the right upper extremity but he still has pain in his right hand associated with cramping of the ring and long finger              ROS: No chest pain or shortness of breath   Images personally read and my interpretation : No imaging  Visit Diagnoses:  1. Right hand paresthesia      Follow-Up Instructions: No follow-ups on file.    Objective: Vital Signs: BP (!) 153/98   Pulse 84   Physical Exam Vitals and nursing note reviewed.  Constitutional:      Appearance: Normal appearance.  HENT:     Head: Normocephalic and atraumatic.  Eyes:     General: No scleral icterus.       Right eye: No discharge.        Left eye: No discharge.     Extraocular Movements: Extraocular movements intact.     Conjunctiva/sclera: Conjunctivae normal.     Pupils: Pupils  are equal, round, and reactive to light.  Cardiovascular:     Rate and Rhythm: Normal rate.     Pulses: Normal pulses.  Skin:    General: Skin is warm and dry.     Capillary Refill: Capillary refill takes less than 2 seconds.  Neurological:     General: No focal deficit present.     Mental Status: He is alert and oriented to person, place, and time.  Psychiatric:        Mood and Affect: Mood normal.        Behavior: Behavior normal.        Thought Content: Thought content normal.        Judgment: Judgment normal.      Ortho Exam  Right hand has no atrophy there is no tenderness over the A1 pulleys of the lesser digits  Carpal tunnel seems nontender  Compression test is negative   Specialty Comments:  No specialty comments available.  Imaging: No results found.   PMFS History: Patient Active Problem List   Diagnosis Date Noted   Cervical spondylosis 05/07/2022   Pain in joint of right shoulder 05/07/2022   Osteoarthritis of left knee 05/25/2021   Compression fracture of lumbar spine, non-traumatic ([facility]) 07/04/2020   Degenerative  scoliosis 07/04/2020   Lumbar pain 07/04/2020   Medial meniscus, posterior horn derangement 05/13/2017   S/P left knee arthroscopy 04/24/17    Primary osteoarthritis of left knee    Past Medical History:  Diagnosis Date   Arthritis    Chronic back pain    Hypertension     Family History  Problem Relation Age of Onset   Cancer Father    Arthritis Father    Arthritis Maternal Aunt    Arthritis Maternal Uncle    Arthritis Paternal Aunt     Past Surgical History:  Procedure Laterality Date   COLONOSCOPY N/A 04/17/2015   Procedure: COLONOSCOPY;  Surgeon: West Bali, MD;  [facility]: [facility];  Service: Endoscopy;  Laterality: N/A;  1:00 PM   KNEE ARTHROSCOPY WITH MEDIAL MENISECTOMY Left 04/24/2017   Procedure: KNEE ARTHROSCOPY WITH MEDIAL MENISECTOMY;  Surgeon: Vickki Hearing, MD;  Location: [facility];  Service: Orthopedics;   Laterality: Left;   TOTAL KNEE ARTHROPLASTY Left 05/25/2021   Procedure: TOTAL KNEE ARTHROPLASTY;  Surgeon: Vickki Hearing, MD;  Location: [facility];  Service: Orthopedics;  Laterality: Left;   Social History   Occupational History   Not on file  Tobacco Use   Smoking status: Former    Current packs/day: 0.00    Types: Cigarettes    Quit date: 04/22/2009    Years since quitting: 13.6   Smokeless tobacco: Never  Substance and Sexual Activity   Alcohol use: No   Drug use: Not Currently    Comment: former   Sexual activity: Not on file

## 2022-11-27 DIAGNOSIS — Z0001 Encounter for general adult medical examination with abnormal findings: Secondary | ICD-10-CM | POA: Diagnosis not present

## 2022-11-27 DIAGNOSIS — E6609 Other obesity due to excess calories: Secondary | ICD-10-CM | POA: Diagnosis not present

## 2022-11-27 DIAGNOSIS — Z9229 Personal history of other drug therapy: Secondary | ICD-10-CM | POA: Diagnosis not present

## 2022-11-27 DIAGNOSIS — Z1389 Encounter for screening for other disorder: Secondary | ICD-10-CM | POA: Diagnosis not present

## 2022-11-27 DIAGNOSIS — I10 Essential (primary) hypertension: Secondary | ICD-10-CM | POA: Diagnosis not present

## 2022-11-27 DIAGNOSIS — Z6835 Body mass index (BMI) 35.0-35.9, adult: Secondary | ICD-10-CM | POA: Diagnosis not present

## 2022-11-27 DIAGNOSIS — Z125 Encounter for screening for malignant neoplasm of prostate: Secondary | ICD-10-CM | POA: Diagnosis not present

## 2022-11-27 DIAGNOSIS — G5600 Carpal tunnel syndrome, unspecified upper limb: Secondary | ICD-10-CM | POA: Diagnosis not present

## 2022-11-27 DIAGNOSIS — Z1331 Encounter for screening for depression: Secondary | ICD-10-CM | POA: Diagnosis not present

## 2022-11-27 DIAGNOSIS — R7309 Other abnormal glucose: Secondary | ICD-10-CM | POA: Diagnosis not present

## 2022-11-27 DIAGNOSIS — M5412 Radiculopathy, cervical region: Secondary | ICD-10-CM | POA: Diagnosis not present

## 2022-11-28 ENCOUNTER — Telehealth: Payer: Self-pay | Admitting: Physical Medicine and Rehabilitation

## 2022-11-28 NOTE — Telephone Encounter (Signed)
Spoke with patient and scheduled NCV for 12/02/22.

## 2022-11-28 NOTE — Telephone Encounter (Signed)
Patient returned call to schedule an appointment with Dr. Alvester Morin   Patient said he was referred to Dr. Alvester Morin.  The number to contact patient is   [phone]

## 2022-12-02 ENCOUNTER — Ambulatory Visit: Payer: BC Managed Care – PPO | Admitting: Physical Medicine and Rehabilitation

## 2022-12-02 DIAGNOSIS — M542 Cervicalgia: Secondary | ICD-10-CM | POA: Diagnosis not present

## 2022-12-02 DIAGNOSIS — R202 Paresthesia of skin: Secondary | ICD-10-CM | POA: Diagnosis not present

## 2022-12-02 DIAGNOSIS — M5412 Radiculopathy, cervical region: Secondary | ICD-10-CM | POA: Diagnosis not present

## 2022-12-02 NOTE — Progress Notes (Unsigned)
Functional Pain Scale - descriptive words and definitions  No Pain (0)   No Pain/Loss of function  Average Pain 0  Right handed. Right hand numbness in all fingers

## 2022-12-02 NOTE — Progress Notes (Unsigned)
   Luke Chavez - 57 y.o. male MRN [id number]  Date of birth: Jun 05, 1965  Office Visit Note: Visit Date: 12/02/2022 PCP: Assunta Found, MD Referred by: Vickki Hearing, MD  Subjective: No chief complaint on file.  HPI: Luke Chavez is a 57 y.o. male who comes in todayHPI    ROS Otherwise per HPI.  Assessment & Plan: Visit Diagnoses: No diagnosis found.   Plan: No additional findings.   Meds & Orders: No orders of the defined types were placed in this encounter.  No orders of the defined types were placed in this encounter.   Follow-up: No follow-ups on file.   Procedures: No procedures performed      Clinical History: No specialty comments available.   He reports that he quit smoking about 13 years ago. His smoking use included cigarettes. He has never used smokeless tobacco. No results for input(s): "HGBA1C", "LABURIC" in the last 8760 hours.  Objective:  VS:  HT:    WT:   BMI:     BP:   HR: bpm  TEMP: ( )  RESP:  Physical Exam  Ortho Exam  Imaging: No results found.  Past Medical/Family/Surgical/Social History: Medications & Allergies reviewed per EMR, new medications updated. Patient Active Problem List   Diagnosis Date Noted   Cervical spondylosis 05/07/2022   Pain in joint of right shoulder 05/07/2022   Osteoarthritis of left knee 05/25/2021   Compression fracture of lumbar spine, non-traumatic ([facility]) 07/04/2020   Degenerative scoliosis 07/04/2020   Lumbar pain 07/04/2020   Medial meniscus, posterior horn derangement 05/13/2017   S/P left knee arthroscopy 04/24/17    Primary osteoarthritis of left knee    Past Medical History:  Diagnosis Date   Arthritis    Chronic back pain    Hypertension    Family History  Problem Relation Age of Onset   Cancer Father    Arthritis Father    Arthritis Maternal Aunt    Arthritis Maternal Uncle    Arthritis Paternal Aunt    Past Surgical History:  Procedure Laterality Date   COLONOSCOPY N/A  04/17/2015   Procedure: COLONOSCOPY;  Surgeon: West Bali, MD;  [facility];  Service: Endoscopy;  Laterality: N/A;  1:00 PM   KNEE ARTHROSCOPY WITH MEDIAL MENISECTOMY Left 04/24/2017   Procedure: KNEE ARTHROSCOPY WITH MEDIAL MENISECTOMY;  Surgeon: Vickki Hearing, MD;  Location: [facility];  Service: Orthopedics;  Laterality: Left;   TOTAL KNEE ARTHROPLASTY Left 05/25/2021   Procedure: TOTAL KNEE ARTHROPLASTY;  Surgeon: Vickki Hearing, MD;  Location: [facility];  Service: Orthopedics;  Laterality: Left;   Social History   Occupational History   Not on file  Tobacco Use   Smoking status: Former    Current packs/day: 0.00    Types: Cigarettes    Quit date: 04/22/2009    Years since quitting: 13.6   Smokeless tobacco: Never  Substance and Sexual Activity   Alcohol use: No   Drug use: Not Currently    Comment: former   Sexual activity: Not on file

## 2022-12-03 ENCOUNTER — Encounter: Payer: Self-pay | Admitting: Physical Medicine and Rehabilitation

## 2022-12-03 NOTE — Procedures (Signed)
EMG & NCV Findings: Evaluation of the right median motor nerve showed prolonged distal onset latency (5.5 ms), reduced amplitude (4.5 mV), and decreased conduction velocity (Elbow-Wrist, 47 m/s).  The right median (across palm) sensory nerve showed prolonged distal peak latency (7.1 ms) and reduced amplitude (7.5 V).  The right ulnar sensory nerve showed reduced amplitude (10.5 V).  All remaining nerves (as indicated in the following tables) were within normal limits.    All examined muscles (as indicated in the following table) showed no evidence of electrical instability.    Impression: The above electrodiagnostic study is ABNORMAL and reveals evidence of a severe right median nerve entrapment at the wrist (carpal tunnel syndrome) affecting sensory and motor components.   There is no significant electrodiagnostic evidence of any other focal nerve entrapment, brachial plexopathy or cervical radiculopathy.  As you know, this particular electrodiagnostic study cannot rule out chemical radiculitis or sensory only radiculopathy.  Recommendations: 1.  Follow-up with referring physician. 2.  Continue current management of symptoms. 3.  Suggest surgical evaluation.  ___________________________ Naaman Plummer FAAPMR Board Certified, American Board of Physical Medicine and Rehabilitation    Nerve Conduction Studies Anti Sensory Summary Table   Stim Site NR Peak (ms) Norm Peak (ms) P-T Amp (V) Norm P-T Amp Site1 Site2 Delta-P (ms) Dist (cm) Vel (m/s) Norm Vel (m/s)  Right Median Acr Palm Anti Sensory (2nd Digit)  31.1C  Wrist    *7.1 <3.6 *7.5 >10        Site 3    5.0  2.0         Right Radial Anti Sensory (Base 1st Digit)  30.4C  Wrist    2.1 <3.1 18.2  Wrist Base 1st Digit 2.1 0.0    Right Ulnar Anti Sensory (5th Digit)  31.2C  Wrist    3.3 <3.7 *10.5 >15.0 Wrist 5th Digit 3.3 14.0 42 >38   Motor Summary Table   Stim Site NR Onset (ms) Norm Onset (ms) O-P Amp (mV) Norm O-P Amp Site1  Site2 Delta-0 (ms) Dist (cm) Vel (m/s) Norm Vel (m/s)  Right Median Motor (Abd Poll Brev)  30.8C  Wrist    *5.5 <4.2 *4.5 >5 Elbow Wrist 5.4 25.5 *47 >50  Elbow    10.9  4.3         Right Ulnar Motor (Abd Dig Min)  31.3C  Wrist    3.1 <4.2 6.6 >3 B Elbow Wrist 4.5 27.0 60 >53  B Elbow    7.6  4.9  A Elbow B Elbow 1.1 11.0 100 >53  A Elbow    8.7  5.8          EMG   Side Muscle Nerve Root Ins Act Fibs Psw Amp Dur Poly Recrt Int Dennie Bible Comment  Right 1stDorInt Ulnar C8-T1 Nml Nml Nml Nml Nml 0 Nml Nml   Right Abd Poll Brev Median C8-T1 Nml Nml Nml Nml Nml 0 Nml Nml   Right ExtDigCom   Nml Nml Nml Nml Nml 0 Nml Nml   Right Triceps Radial C6-7-8 Nml Nml Nml Nml Nml 0 Nml Nml   Right Deltoid Axillary C5-6 Nml Nml Nml Nml Nml 0 Nml Nml     Nerve Conduction Studies Anti Sensory Left/Right Comparison   Stim Site L Lat (ms) R Lat (ms) L-R Lat (ms) L Amp (V) R Amp (V) L-R Amp (%) Site1 Site2 L Vel (m/s) R Vel (m/s) L-R Vel (m/s)  Median Acr Palm Anti Sensory (2nd Digit)  31.1C  Wrist  *7.1   *7.5        Site 3  5.0   2.0        Radial Anti Sensory (Base 1st Digit)  30.4C  Wrist  2.1   18.2  Wrist Base 1st Digit     Ulnar Anti Sensory (5th Digit)  31.2C  Wrist  3.3   *10.5  Wrist 5th Digit  42    Motor Left/Right Comparison   Stim Site L Lat (ms) R Lat (ms) L-R Lat (ms) L Amp (mV) R Amp (mV) L-R Amp (%) Site1 Site2 L Vel (m/s) R Vel (m/s) L-R Vel (m/s)  Median Motor (Abd Poll Brev)  30.8C  Wrist  *5.5   *4.5  Elbow Wrist  *47   Elbow  10.9   4.3        Ulnar Motor (Abd Dig Min)  31.3C  Wrist  3.1   6.6  B Elbow Wrist  60   B Elbow  7.6   4.9  A Elbow B Elbow  100   A Elbow  8.7   5.8           Waveforms:

## 2022-12-23 ENCOUNTER — Encounter: Payer: Self-pay | Admitting: Orthopedic Surgery

## 2022-12-23 ENCOUNTER — Ambulatory Visit: Payer: Self-pay | Admitting: Orthopedic Surgery

## 2022-12-23 DIAGNOSIS — Z01818 Encounter for other preprocedural examination: Secondary | ICD-10-CM

## 2022-12-23 DIAGNOSIS — G5601 Carpal tunnel syndrome, right upper limb: Secondary | ICD-10-CM

## 2022-12-23 NOTE — Patient Instructions (Addendum)
Your surgery will be at [facility] by Dr Romeo Apple on Oct 29th,   Give note excuse from jury duty x 3 months  The hospital will contact you with a preoperative appointment to discuss Anesthesia.  Please arrive on time or 15 minutes early for the preoperative appointment, they have a very tight schedule if you are late or do not come in your surgery will be cancelled.  The phone number is [phone]. Please bring your medications with you for the appointment. They will tell you the arrival time and medication instructions when you have your preoperative evaluation. Do not wear nail polish the day of your surgery and if you take Phentermine you need to stop this medication ONE WEEK prior to your surgery. If you take Docia Barrier, Jardiance, or Steglatro) - Hold 72 hours before the procedure.  If you take Ozempic,  Mounjaro, Bydureon or Trulicity do not take for 8 days before your surgery. If you take Victoza, Rybelsis, Saxenda or Adlyxi stop 24 hours before the procedure.  Please arrive at the hospital 2 hours before procedure if scheduled at 9:30 or later in the day or at the time the nurse tells you at your preoperative visit.   If you have my chart do not use the time given in my chart use the time given to you by the nurse during your preoperative visit.   Your surgery  time may change. Please be available for phone calls the day of your surgery and the day before. The Short Stay department may need to discuss changes about your surgery time. Not reaching the you could lead to procedure delays and possible cancellation.  You must have a ride home and someone to stay with you for 24 to 48 hours. The person taking you home will receive and sign for the your discharge instructions.  Please be prepared to give your support person's name and telephone number to Central Registration. Dr Romeo Apple will need that name and phone number post procedure.

## 2022-12-23 NOTE — Progress Notes (Signed)
   VISIT TYPE: FOLLOW UP   Chief Complaint  Patient presents with   Follow-up    Recheck on right hand.    Encounter Diagnosis  Name Primary?   Carpal tunnel syndrome of right wrist Yes    Assessment and Plan: 57 year old male right handed works in [employer] business severe carpal tunnel syndrome on's nerve conduction study  Recommend carpal tunnel release right wrist  The procedure has been fully reviewed with the patient; The risks and benefits of surgery have been discussed and explained and understood. Alternative treatment has also been reviewed, questions were encouraged and answered. The postoperative plan is also been reviewed.   A/P Encounter Diagnosis  Name Primary?   Carpal tunnel syndrome of right wrist Yes    No orders of the defined types were placed in this encounter.  Problem list, medical hx, medications and allergies reviewed    Plan: Impression: Clinical scenario seems like this is a pretty significant carpal tunnel syndrome may be on top of cervical radiculopathy.  I do think his hand symptoms are probably more carpal tunnel syndrome.  Electrodiagnostic study performed today.  The above electrodiagnostic study is ABNORMAL and reveals evidence of a severe right median nerve entrapment at the wrist (carpal tunnel syndrome) affecting sensory and motor components.   EMG & NCV Findings: Evaluation of the right median motor nerve showed prolonged distal onset latency (5.5 ms), reduced amplitude (4.5 mV), and decreased conduction velocity (Elbow-Wrist, 47 m/s).  The right median (across palm) sensory nerve showed prolonged distal peak latency (7.1 ms) and reduced amplitude (7.5 V).  The right ulnar sensory nerve showed reduced amplitude (10.5 V).  All remaining nerves (as indicated in the following tables) were within normal limits.     All examined muscles (as indicated in the following table) showed no evidence of electrical instability.      Impression: The above electrodiagnostic study is ABNORMAL and reveals evidence of a severe right median nerve entrapment at the wrist (carpal tunnel syndrome) affecting sensory and motor components.    There is no significant electrodiagnostic evidence of any other focal nerve entrapment, brachial plexopathy or cervical radiculopathy.  As you know, this particular electrodiagnostic study cannot rule out chemical radiculitis or sensory only radiculopathy.

## 2022-12-23 NOTE — Addendum Note (Signed)
Addended byCaffie Damme on: 12/23/2022 01:37 PM   Modules accepted: Orders

## 2022-12-24 NOTE — Patient Instructions (Signed)
Luke Chavez  12/24/2022     @PREFPERIOPPHARMACY @   Your procedure is scheduled on  12/31/2022.   Report to [facility] at  1000  A.M.   Call this number if you have problems the morning of surgery:  [phone]  If you experience any cold or flu symptoms such as cough, fever, chills, shortness of breath, etc. between now and your scheduled surgery, please notify us at the above number.   Remember:  Do not eat after midnight.    You may drink clear liquids until 0800 am on 12/31/2022.    Clear liquids allowed are:                    Water, Carbonated beverages (diabetics please choose diet or no sugar options), Black Coffee Only (No creamer, milk or cream, including half & half and powdered creamer), and Clear Sports drink (No red color; diabetics please choose diet or no sugar options)    Take these medicines the morning of surgery with A SIP OF WATER                                              None.     Do not wear jewelry, make-up or nail polish, including gel polish,  artificial nails, or any other type of covering on natural nails (fingers and  toes).  Do not wear lotions, powders, or perfumes, or deodorant.  Do not shave 48 hours prior to surgery.  Men may shave face and neck.  Do not bring valuables to the hospital.  [facility] is not responsible for any belongings or valuables.  Contacts, dentures or bridgework may not be worn into surgery.  Leave your suitcase in the car.  After surgery it may be brought to your room.  For patients admitted to the hospital, discharge time will be determined by your treatment team.  Patients discharged the day of surgery will not be allowed to drive home and must have someone with them for 24 hours.    Special instructions:   DO NOT smoke tobacco or vape for 24 hours before your procedure.   Please read over the following fact sheets that you were given. Coughing and Deep Breathing, Surgical Site Infection  Prevention, Anesthesia Post-op Instructions, and Care and Recovery After Surgery      Open Carpal Tunnel Release, Care After This sheet gives you information about how to care for yourself after your procedure. Your health care provider may also give you more specific instructions. If you have problems or questions, contact your health care provider. What can I expect after the procedure? After the procedure, it is common to have: Pain. Swelling. Wrist stiffness. Bruising. Follow these instructions at home: Medicines Take over-the-counter and prescription medicines only as told by your health care provider. Ask your health care provider if the medicine prescribed to you: Requires you to avoid driving or using machinery. Can cause constipation. You may need to take these actions to prevent or treat constipation: Drink enough fluid to keep your urine pale yellow. Take over-the-counter or prescription medicines. Eat foods that are high in fiber, such as beans, whole grains, and fresh fruits and vegetables. Limit foods that are high in fat and processed sugars, such as fried or sweet foods. Bathing Do not take baths, swim,  or use a hot tub until your health care provider approves. Ask your health care provider if you may take showers. Keep your bandage (dressing) dry until your health care provider says it can be removed. Cover it with a watertight covering when you take a bath or a shower. If you have a splint or brace: Wear the splint or brace as told by your health care provider. You may need to wear it for 2-3 weeks. Remove it only as told by your health care provider. Loosen the splint or brace if your fingers tingle, become numb, or turn cold and blue. Keep the splint or brace clean. If the splint or brace is not waterproof: Do not let it get wet. Cover it with a watertight covering when you take a bath or a shower. Incision care  After the compression bandage has been removed,  follow instructions from your health care provider about how to take care of your incision. Make sure you: Wash your hands with soap and water for at least 20 seconds before and after you change your bandage (dressing). If soap and water are not available, use hand sanitizer. Change your dressing as told by your health care provider. Leave stitches (sutures), skin glue, or adhesive strips in place. These skin closures may need to stay in place for 2 weeks or longer. If adhesive strip edges start to loosen and curl up, you may trim the loose edges. Do not remove adhesive strips completely unless your health care provider tells you to do that. Check your incision area every day for signs of infection. Check for: Redness. More swelling or pain. Fluid or blood. Warmth. Pus or a bad smell. Managing pain, stiffness, and swelling  If directed, put ice on the affected area. If you have a removable splint or brace, remove it as told by your health care provider. Put ice in a plastic bag. Place a towel between your skin and the bag. Leave the ice on for 20 minutes, 2-3 times a day. Do not fall asleep with ice pack on your skin. Remove the ice if your skin turns bright red. This is very important. If you cannot feel pain, heat, or cold, you have a greater risk of damage to the area. Move your fingers often to avoid stiffness and to lessen swelling. Raise (elevate) your wrist above the level of your heart while you are sitting or lying down. Activity Do not drive until your health care provider approves. Use your hand carefully. Do not do activities that cause pain. You should be able to do light activities with your hand. Do not lift with your affected hand until your health care provider approves. Avoid pulling and pushing with the injured arm. Return to your normal activities as told by your health care provider. Ask your health care provider what activities are safe for you. If physical therapy was  prescribed, do exercises as told by your therapist. Physical therapy can help you heal faster and regain movement. General instructions Do not use any products that contain nicotine or tobacco, such as cigarettes and e-cigarettes. These can delay incision healing after surgery. If you need help quitting, ask your health care provider. Keep all follow-up visits. This is important. These include visits for physical therapy. Contact a health care provider if: You have redness around your incision. You have more swelling or pain. You have fluid or blood coming from your incision. Your incision feels warm to the touch. You have pus or a  bad smell coming from your incision. You have a fever or chills. You have pain that does not get better with medicine. Your carpal tunnel symptoms do not go away after 2 months. Your carpal tunnel symptoms go away and then come back. Get help right away if: You have pain or numbness that is getting worse. Your fingers or fingertips become very pale or bluish in color. You are not able to move your fingers. Summary It is common to have wrist stiffness and bruising after a carpal tunnel release. Icing and raising (elevating) your wrist may help to lessen swelling and pain. Call your health care provider if you have a fever or notice any signs of infection in your incision area. This information is not intended to replace advice given to you by your health care provider. Make sure you discuss any questions you have with your health care provider. Document Revised: 06/24/2019 Document Reviewed: 06/24/2019 Elsevier Patient Education  2024 [employer] Monitored Anesthesia Care, Care After The following information offers guidance on how to care for yourself after your procedure. Your health care provider may also give you more specific instructions. If you have problems or questions, contact your health care provider. What can I expect after the procedure? After  the procedure, it is common to have: Tiredness. Little or no memory about what happened during or after the procedure. Impaired judgment when it comes to making decisions. Nausea or vomiting. Some trouble with balance. Follow these instructions at home: For the time period you were told by your health care provider:  Rest. Do not participate in activities where you could fall or become injured. Do not drive or use machinery. Do not drink alcohol. Do not take sleeping pills or medicines that cause drowsiness. Do not make important decisions or sign legal documents. Do not take care of children on your own. Medicines Take over-the-counter and prescription medicines only as told by your health care provider. If you were prescribed antibiotics, take them as told by your health care provider. Do not stop using the antibiotic even if you start to feel better. Eating and drinking Follow instructions from your health care provider about what you may eat and drink. Drink enough fluid to keep your urine pale yellow. If you vomit: Drink clear fluids slowly and in small amounts as you are able. Clear fluids include water, ice chips, low-calorie sports drinks, and fruit juice that has water added to it (diluted fruit juice). Eat light and bland foods in small amounts as you are able. These foods include bananas, applesauce, rice, lean meats, toast, and crackers. General instructions  Have a responsible adult stay with you for the time you are told. It is important to have someone help care for you until you are awake and alert. If you have sleep apnea, surgery and some medicines can increase your risk for breathing problems. Follow instructions from your health care provider about wearing your sleep device: When you are sleeping. This includes during daytime naps. While taking prescription pain medicines, sleeping medicines, or medicines that make you drowsy. Do not use any products that contain  nicotine or tobacco. These products include cigarettes, chewing tobacco, and vaping devices, such as e-cigarettes. If you need help quitting, ask your health care provider. Contact a health care provider if: You feel nauseous or vomit every time you eat or drink. You feel light-headed. You are still sleepy or having trouble with balance after 24 hours. You get a rash. You have a  fever. You have redness or swelling around the IV site. Get help right away if: You have trouble breathing. You have new confusion after you get home. These symptoms may be an emergency. Get help right away. Call 911. Do not wait to see if the symptoms will go away. Do not drive yourself to the hospital. This information is not intended to replace advice given to you by your health care provider. Make sure you discuss any questions you have with your health care provider. Document Revised: 07/16/2021 Document Reviewed: 07/16/2021 Elsevier Patient Education  2024 [employer] How to Use Chlorhexidine Before Surgery Chlorhexidine gluconate (CHG) is a germ-killing (antiseptic) solution that is used to clean the skin. It can get rid of the bacteria that normally live on the skin and can keep them away for about 24 hours. To clean your skin with CHG, you may be given: A CHG solution to use in the shower or as part of a sponge bath. A prepackaged cloth that contains CHG. Cleaning your skin with CHG may help lower the risk for infection: While you are staying in the intensive care unit of the hospital. If you have a vascular access, such as a central line, to provide short-term or long-term access to your veins. If you have a catheter to drain urine from your bladder. If you are on a ventilator. A ventilator is a machine that helps you breathe by moving air in and out of your lungs. After surgery. What are the risks? Risks of using CHG include: A skin reaction. Hearing loss, if CHG gets in your ears and you have a  perforated eardrum. Eye injury, if CHG gets in your eyes and is not rinsed out. The CHG product catching fire. Make sure that you avoid smoking and flames after applying CHG to your skin. Do not use CHG: If you have a chlorhexidine allergy or have previously reacted to chlorhexidine. On babies younger than 26 months of age. How to use CHG solution Use CHG only as told by your health care provider, and follow the instructions on the label. Use the full amount of CHG as directed. Usually, this is one bottle. During a shower Follow these steps when using CHG solution during a shower (unless your health care provider gives you different instructions): Start the shower. Use your normal soap and shampoo to wash your face and hair. Turn off the shower or move out of the shower stream. Pour the CHG onto a clean washcloth. Do not use any type of brush or rough-edged sponge. Starting at your neck, lather your body down to your toes. Make sure you follow these instructions: If you will be having surgery, pay special attention to the part of your body where you will be having surgery. Scrub this area for at least 1 minute. Do not use CHG on your head or face. If the solution gets into your ears or eyes, rinse them well with water. Avoid your genital area. Avoid any areas of skin that have broken skin, cuts, or scrapes. Scrub your back and under your arms. Make sure to wash skin folds. Let the lather sit on your skin for 1-2 minutes or as long as told by your health care provider. Thoroughly rinse your entire body in the shower. Make sure that all body creases and crevices are rinsed well. Dry off with a clean towel. Do not put any substances on your body afterward--such as powder, lotion, or perfume--unless you are told to do so  by your health care provider. Only use lotions that are recommended by the manufacturer. Put on clean clothes or pajamas. If it is the night before your surgery, sleep in clean  sheets.  During a sponge bath Follow these steps when using CHG solution during a sponge bath (unless your health care provider gives you different instructions): Use your normal soap and shampoo to wash your face and hair. Pour the CHG onto a clean washcloth. Starting at your neck, lather your body down to your toes. Make sure you follow these instructions: If you will be having surgery, pay special attention to the part of your body where you will be having surgery. Scrub this area for at least 1 minute. Do not use CHG on your head or face. If the solution gets into your ears or eyes, rinse them well with water. Avoid your genital area. Avoid any areas of skin that have broken skin, cuts, or scrapes. Scrub your back and under your arms. Make sure to wash skin folds. Let the lather sit on your skin for 1-2 minutes or as long as told by your health care provider. Using a different clean, wet washcloth, thoroughly rinse your entire body. Make sure that all body creases and crevices are rinsed well. Dry off with a clean towel. Do not put any substances on your body afterward--such as powder, lotion, or perfume--unless you are told to do so by your health care provider. Only use lotions that are recommended by the manufacturer. Put on clean clothes or pajamas. If it is the night before your surgery, sleep in clean sheets. How to use CHG prepackaged cloths Only use CHG cloths as told by your health care provider, and follow the instructions on the label. Use the CHG cloth on clean, dry skin. Do not use the CHG cloth on your head or face unless your health care provider tells you to. When washing with the CHG cloth: Avoid your genital area. Avoid any areas of skin that have broken skin, cuts, or scrapes. Before surgery Follow these steps when using a CHG cloth to clean before surgery (unless your health care provider gives you different instructions): Using the CHG cloth, vigorously scrub the  part of your body where you will be having surgery. Scrub using a back-and-forth motion for 3 minutes. The area on your body should be completely wet with CHG when you are done scrubbing. Do not rinse. Discard the cloth and let the area air-dry. Do not put any substances on the area afterward, such as powder, lotion, or perfume. Put on clean clothes or pajamas. If it is the night before your surgery, sleep in clean sheets.  For general bathing Follow these steps when using CHG cloths for general bathing (unless your health care provider gives you different instructions). Use a separate CHG cloth for each area of your body. Make sure you wash between any folds of skin and between your fingers and toes. Wash your body in the following order, switching to a new cloth after each step: The front of your neck, shoulders, and chest. Both of your arms, under your arms, and your hands. Your stomach and groin area, avoiding the genitals. Your right leg and foot. Your left leg and foot. The back of your neck, your back, and your buttocks. Do not rinse. Discard the cloth and let the area air-dry. Do not put any substances on your body afterward--such as powder, lotion, or perfume--unless you are told to do so by your  health care provider. Only use lotions that are recommended by the manufacturer. Put on clean clothes or pajamas. Contact a health care provider if: Your skin gets irritated after scrubbing. You have questions about using your solution or cloth. You swallow any chlorhexidine. Call your local poison control center ([phone] in the [place] Get help right away if: Your eyes itch badly, or they become very red or swollen. Your skin itches badly and is red or swollen. Your hearing changes. You have trouble seeing. You have swelling or tingling in your mouth or throat. You have trouble breathing. These symptoms may represent a serious problem that is an emergency. Do not wait to see if the  symptoms will go away. Get medical help right away. Call your local emergency services (911 in the [place] Do not drive yourself to the hospital. Summary Chlorhexidine gluconate (CHG) is a germ-killing (antiseptic) solution that is used to clean the skin. Cleaning your skin with CHG may help to lower your risk for infection. You may be given CHG to use for bathing. It may be in a bottle or in a prepackaged cloth to use on your skin. Carefully follow your health care provider's instructions and the instructions on the product label. Do not use CHG if you have a chlorhexidine allergy. Contact your health care provider if your skin gets irritated after scrubbing. This information is not intended to replace advice given to you by your health care provider. Make sure you discuss any questions you have with your health care provider. Document Revised: 06/18/2021 Document Reviewed: 05/01/2020 Elsevier Patient Education  2023 [employer].

## 2022-12-25 ENCOUNTER — Encounter (HOSPITAL_COMMUNITY)
Admission: RE | Admit: 2022-12-25 | Discharge: 2022-12-25 | Disposition: A | Discharge status: Home / Self Care | Payer: BC Managed Care – PPO | Source: Ambulatory Visit | Attending: Orthopedic Surgery | Admitting: Orthopedic Surgery

## 2022-12-25 ENCOUNTER — Encounter (HOSPITAL_COMMUNITY): Payer: Self-pay

## 2022-12-25 VITALS — BP 129/91 | HR 74 | Temp 97.8°F | Resp 18 | Ht 74.0 in | Wt 272.0 lb

## 2022-12-25 DIAGNOSIS — G5601 Carpal tunnel syndrome, right upper limb: Secondary | ICD-10-CM | POA: Diagnosis not present

## 2022-12-25 DIAGNOSIS — I10 Essential (primary) hypertension: Secondary | ICD-10-CM | POA: Insufficient documentation

## 2022-12-25 DIAGNOSIS — Z01818 Encounter for other preprocedural examination: Secondary | ICD-10-CM | POA: Insufficient documentation

## 2022-12-25 LAB — CBC WITH DIFFERENTIAL/PLATELET
Abs Immature Granulocytes: 0.03 10*3/uL (ref 0.00–0.07)
Basophils Absolute: 0.1 10*3/uL (ref 0.0–0.1)
Basophils Relative: 1 %
Eosinophils Absolute: 0.2 10*3/uL (ref 0.0–0.5)
Eosinophils Relative: 4 %
HCT: 41.6 % (ref 39.0–52.0)
Hemoglobin: 13.6 g/dL (ref 13.0–17.0)
Immature Granulocytes: 1 %
Lymphocytes Relative: 20 %
Lymphs Abs: 0.9 10*3/uL (ref 0.7–4.0)
MCH: 28.4 pg (ref 26.0–34.0)
MCHC: 32.7 g/dL (ref 30.0–36.0)
MCV: 86.8 fL (ref 80.0–100.0)
Monocytes Absolute: 0.3 10*3/uL (ref 0.1–1.0)
Monocytes Relative: 7 %
Neutro Abs: 3.2 10*3/uL (ref 1.7–7.7)
Neutrophils Relative %: 67 %
Platelets: 215 10*3/uL (ref 150–400)
RBC: 4.79 MIL/uL (ref 4.22–5.81)
RDW: 13.1 % (ref 11.5–15.5)
WBC: 4.7 10*3/uL (ref 4.0–10.5)
nRBC: 0 % (ref 0.0–0.2)

## 2022-12-30 NOTE — H&P (Signed)
Encounter Diagnosis  Name Primary?  . Carpal tunnel syndrome of right wrist Yes      Assessment and Plan: 57 year old male right handed works in [employer] business severe carpal tunnel syndrome on nerve conduction study  He c/o pain and paresthesias and difficulty gripping with HS pain    Recommend carpal tunnel release right wrist   The procedure has been fully reviewed with the patient; The risks and benefits of surgery have been discussed and explained and understood. Alternative treatment has also been reviewed, questions were encouraged and answered. The postoperative plan is also been reviewed.     A/P     Encounter Diagnosis  Name Primary?  . Carpal tunnel syndrome of right wrist Yes      No orders of the defined types were placed in this encounter.   Problem list, medical hx, medications and allergies reviewed    Plan: Impression: Clinical scenario seems like this is a pretty significant carpal tunnel syndrome may be on top of cervical radiculopathy.  I do think his hand symptoms are probably more carpal tunnel syndrome.  Electrodiagnostic study performed today.  The above electrodiagnostic study is ABNORMAL and reveals evidence of a severe right median nerve entrapment at the wrist (carpal tunnel syndrome) affecting sensory and motor components.    EMG & NCV Findings: Evaluation of the right median motor nerve showed prolonged distal onset latency (5.5 ms), reduced amplitude (4.5 mV), and decreased conduction velocity (Elbow-Wrist, 47 m/s).  The right median (across palm) sensory nerve showed prolonged distal peak latency (7.1 ms) and reduced amplitude (7.5 V).  The right ulnar sensory nerve showed reduced amplitude (10.5 V).  All remaining nerves (as indicated in the following tables) were within normal limits.     All examined muscles (as indicated in the following table) showed no evidence of electrical instability.     Impression: The above electrodiagnostic  study is ABNORMAL and reveals evidence of a severe right median nerve entrapment at the wrist (carpal tunnel syndrome) affecting sensory and motor components.    There is no significant electrodiagnostic evidence of any other focal nerve entrapment, brachial plexopathy or cervical radiculopathy.  As you know, this particular electrodiagnostic study cannot rule out chemical radiculitis or sensory only radiculopathy.  Physical Exam Vitals and nursing note reviewed.  Constitutional:      Appearance: Normal appearance.  HENT:     Head: Normocephalic and atraumatic.  Eyes:     General: No scleral icterus.       Right eye: No discharge.        Left eye: No discharge.     Extraocular Movements: Extraocular movements intact.     Conjunctiva/sclera: Conjunctivae normal.     Pupils: Pupils are equal, round, and reactive to light.  Cardiovascular:     Rate and Rhythm: Normal rate.     Pulses: Normal pulses.  Musculoskeletal:     Comments: Right hand: Skin normal  Tenderness none ROM normal  Instability none Atrophy none   CT tests: compression + Tinels neg. Phalens + Sensation changes thumb, index, long    Skin:    General: Skin is warm and dry.     Capillary Refill: Capillary refill takes less than 2 seconds.  Neurological:     General: No focal deficit present.     Mental Status: He is alert and oriented to person, place, and time.     Sensory: Sensory deficit present.  Psychiatric:        Mood and  Affect: Mood normal.        Behavior: Behavior normal.        Thought Content: Thought content normal.        Judgment: Judgment normal.

## 2022-12-31 ENCOUNTER — Ambulatory Visit (HOSPITAL_COMMUNITY)
Admission: RE | Admit: 2022-12-31 | Discharge: 2022-12-31 | Disposition: A | Discharge status: Home / Self Care | Payer: BC Managed Care – PPO | Attending: Orthopedic Surgery | Admitting: Orthopedic Surgery

## 2022-12-31 ENCOUNTER — Ambulatory Visit (HOSPITAL_COMMUNITY): Payer: Self-pay | Admitting: Certified Registered"

## 2022-12-31 ENCOUNTER — Encounter (HOSPITAL_COMMUNITY)
Admission: RE | Disposition: A | Discharge status: Home / Self Care | Payer: Self-pay | Source: Home / Self Care | Attending: Orthopedic Surgery

## 2022-12-31 ENCOUNTER — Encounter (HOSPITAL_COMMUNITY): Payer: Self-pay | Admitting: Orthopedic Surgery

## 2022-12-31 ENCOUNTER — Ambulatory Visit (HOSPITAL_COMMUNITY): Payer: BC Managed Care – PPO | Admitting: Certified Registered"

## 2022-12-31 DIAGNOSIS — Z87891 Personal history of nicotine dependence: Secondary | ICD-10-CM | POA: Insufficient documentation

## 2022-12-31 DIAGNOSIS — I10 Essential (primary) hypertension: Secondary | ICD-10-CM | POA: Diagnosis not present

## 2022-12-31 DIAGNOSIS — G5601 Carpal tunnel syndrome, right upper limb: Secondary | ICD-10-CM | POA: Diagnosis not present

## 2022-12-31 HISTORY — PX: CARPAL TUNNEL RELEASE: SHX101

## 2022-12-31 SURGERY — CARPAL TUNNEL RELEASE
Anesthesia: General | Site: Hand | Laterality: Right

## 2022-12-31 MED ORDER — CHLORHEXIDINE GLUCONATE 0.12 % MT SOLN
15.0000 mL | Freq: Once | OROMUCOSAL | Status: AC
  Administered 2022-12-31: 15 mL via OROMUCOSAL
  Filled 2022-12-31: qty 15

## 2022-12-31 MED ORDER — FENTANYL CITRATE PF 50 MCG/ML IJ SOSY: 25.0000 ug | PREFILLED_SYRINGE | INTRAMUSCULAR | Status: DC | PRN

## 2022-12-31 MED ORDER — PROPOFOL 500 MG/50ML IV EMUL
INTRAVENOUS | Status: DC | PRN
  Administered 2022-12-31: 20 mg via INTRAVENOUS
  Administered 2022-12-31: 30 mg via INTRAVENOUS
  Administered 2022-12-31: 50 ug/kg/min via INTRAVENOUS
  Administered 2022-12-31: 30 mg via INTRAVENOUS

## 2022-12-31 MED ORDER — ONDANSETRON HCL 4 MG/2ML IJ SOLN
INTRAMUSCULAR | Status: AC
  Filled 2022-12-31: qty 6

## 2022-12-31 MED ORDER — CEFAZOLIN IN SODIUM CHLORIDE 3-0.9 GM/100ML-% IV SOLN
3.0000 g | INTRAVENOUS | Status: DC
  Filled 2022-12-31: qty 100

## 2022-12-31 MED ORDER — EPINEPHRINE 1 MG/10ML IJ SOSY
PREFILLED_SYRINGE | INTRAMUSCULAR | Status: AC
  Filled 2022-12-31: qty 10

## 2022-12-31 MED ORDER — ONDANSETRON HCL 4 MG/2ML IJ SOLN
INTRAMUSCULAR | Status: DC | PRN
  Administered 2022-12-31: 4 mg via INTRAVENOUS

## 2022-12-31 MED ORDER — DEXMEDETOMIDINE HCL IN NACL 80 MCG/20ML IV SOLN
INTRAVENOUS | Status: AC
Start: 2022-12-31 — End: ?
  Filled 2022-12-31: qty 20

## 2022-12-31 MED ORDER — 0.9 % SODIUM CHLORIDE (POUR BTL) OPTIME
TOPICAL | Status: DC | PRN
  Administered 2022-12-31: 1000 mL

## 2022-12-31 MED ORDER — ACETAMINOPHEN-CODEINE 300-30 MG PO TABS: 1.0000 | ORAL_TABLET | Freq: Four times a day (QID) | ORAL | 0 refills | Status: DC | PRN

## 2022-12-31 MED ORDER — FENTANYL CITRATE (PF) 100 MCG/2ML IJ SOLN
INTRAMUSCULAR | Status: AC
  Filled 2022-12-31: qty 2

## 2022-12-31 MED ORDER — LIDOCAINE HCL (PF) 2 % IJ SOLN
INTRAMUSCULAR | Status: AC
  Filled 2022-12-31: qty 15

## 2022-12-31 MED ORDER — MIDAZOLAM HCL 2 MG/2ML IJ SOLN
INTRAMUSCULAR | Status: AC
  Filled 2022-12-31: qty 2

## 2022-12-31 MED ORDER — ONDANSETRON HCL 4 MG/2ML IJ SOLN: 4.0000 mg | Freq: Once | INTRAMUSCULAR | Status: DC | PRN

## 2022-12-31 MED ORDER — GLYCOPYRROLATE PF 0.2 MG/ML IJ SOSY
PREFILLED_SYRINGE | INTRAMUSCULAR | Status: AC
  Filled 2022-12-31: qty 2

## 2022-12-31 MED ORDER — LIDOCAINE HCL (PF) 1 % IJ SOLN
INTRAMUSCULAR | Status: AC
  Filled 2022-12-31: qty 30

## 2022-12-31 MED ORDER — BUPIVACAINE HCL (PF) 0.5 % IJ SOLN
INTRAMUSCULAR | Status: DC | PRN
  Administered 2022-12-31: 10 mL

## 2022-12-31 MED ORDER — PROPOFOL 10 MG/ML IV BOLUS
INTRAVENOUS | Status: AC
  Filled 2022-12-31: qty 20

## 2022-12-31 MED ORDER — SUCCINYLCHOLINE CHLORIDE 200 MG/10ML IV SOSY
PREFILLED_SYRINGE | INTRAVENOUS | Status: AC
  Filled 2022-12-31: qty 10

## 2022-12-31 MED ORDER — DIPHENHYDRAMINE HCL 50 MG/ML IJ SOLN
INTRAMUSCULAR | Status: AC
  Filled 2022-12-31: qty 1

## 2022-12-31 MED ORDER — LIDOCAINE HCL (PF) 1 % IJ SOLN
INTRAMUSCULAR | Status: DC | PRN
  Administered 2022-12-31: 10 mL

## 2022-12-31 MED ORDER — FENTANYL CITRATE (PF) 100 MCG/2ML IJ SOLN
INTRAMUSCULAR | Status: DC | PRN
  Administered 2022-12-31 (×2): 50 ug via INTRAVENOUS

## 2022-12-31 MED ORDER — GLYCOPYRROLATE 0.2 MG/ML IJ SOLN
INTRAMUSCULAR | Status: DC | PRN
  Administered 2022-12-31: .2 mg via INTRAVENOUS

## 2022-12-31 MED ORDER — DEXTROSE 5 % IV SOLN
INTRAVENOUS | Status: DC | PRN
  Administered 2022-12-31: 3 g via INTRAVENOUS

## 2022-12-31 MED ORDER — OXYCODONE HCL 5 MG/5ML PO SOLN: 5.0000 mg | Freq: Once | ORAL | Status: DC | PRN

## 2022-12-31 MED ORDER — BUPIVACAINE HCL (PF) 0.5 % IJ SOLN
INTRAMUSCULAR | Status: AC
Start: 2022-12-31 — End: ?
  Filled 2022-12-31: qty 30

## 2022-12-31 MED ORDER — DEXAMETHASONE SODIUM PHOSPHATE 10 MG/ML IJ SOLN
INTRAMUSCULAR | Status: AC
Start: 2022-12-31 — End: ?
  Filled 2022-12-31: qty 2

## 2022-12-31 MED ORDER — DEXMEDETOMIDINE HCL IN NACL 80 MCG/20ML IV SOLN
INTRAVENOUS | Status: DC | PRN
  Administered 2022-12-31: 8 ug via INTRAVENOUS
  Administered 2022-12-31: 20 ug via INTRAVENOUS
  Administered 2022-12-31: 12 ug via INTRAVENOUS

## 2022-12-31 MED ORDER — LIDOCAINE HCL (PF) 2 % IJ SOLN
INTRAMUSCULAR | Status: AC
Start: 2022-12-31 — End: ?
  Filled 2022-12-31: qty 5

## 2022-12-31 MED ORDER — OXYCODONE HCL 5 MG PO TABS: 5.0000 mg | ORAL_TABLET | Freq: Once | ORAL | Status: DC | PRN

## 2022-12-31 MED ORDER — KETOROLAC TROMETHAMINE 30 MG/ML IJ SOLN
INTRAMUSCULAR | Status: AC
Start: 2022-12-31 — End: ?
  Filled 2022-12-31: qty 2

## 2022-12-31 MED ORDER — LACTATED RINGERS IV SOLN: INTRAVENOUS | Status: DC | PRN

## 2022-12-31 MED ORDER — MIDAZOLAM HCL 5 MG/5ML IJ SOLN
INTRAMUSCULAR | Status: DC | PRN
  Administered 2022-12-31 (×2): 1 mg via INTRAVENOUS

## 2022-12-31 SURGICAL SUPPLY — 38 items
APL PRP STRL LF DISP 70% ISPRP (MISCELLANEOUS) ×1
BANDAGE ESMARK 4X12 BL STRL LF (DISPOSABLE) ×2 IMPLANT
BLADE SURG 15 STRL LF DISP TIS (BLADE) ×2 IMPLANT
BLADE SURG 15 STRL SS (BLADE) ×1
BNDG CMPR 12X4 ELC STRL LF (DISPOSABLE) ×1
BNDG CMPR STD VLCR NS LF 5.8X3 (GAUZE/BANDAGES/DRESSINGS) ×1
BNDG ELASTIC 3X5.8 VLCR NS LF (GAUZE/BANDAGES/DRESSINGS) ×2 IMPLANT
BNDG ESMARK 4X12 BLUE STRL LF (DISPOSABLE) ×1
BNDG GAUZE DERMACEA FLUFF 4 (GAUZE/BANDAGES/DRESSINGS) IMPLANT
BNDG GAUZE ELAST 4 BULKY (GAUZE/BANDAGES/DRESSINGS) ×2 IMPLANT
BNDG GZE DERMACEA 4 6PLY (GAUZE/BANDAGES/DRESSINGS) ×1
CHLORAPREP W/TINT 26 (MISCELLANEOUS) ×2 IMPLANT
CLOTH BEACON ORANGE TIMEOUT ST (SAFETY) ×2 IMPLANT
COVER LIGHT HANDLE STERIS (MISCELLANEOUS) ×4 IMPLANT
CUFF TOURN SGL QUICK 18X4 (TOURNIQUET CUFF) ×2 IMPLANT
ELECT NDL TIP 2.8 STRL (NEEDLE) IMPLANT
ELECT NEEDLE TIP 2.8 STRL (NEEDLE)
ELECT REM PT RETURN 9FT ADLT (ELECTROSURGICAL) ×1
ELECTRODE REM PT RTRN 9FT ADLT (ELECTROSURGICAL) ×2 IMPLANT
GAUZE SPONGE 4X4 12PLY STRL (GAUZE/BANDAGES/DRESSINGS) ×2 IMPLANT
GAUZE XEROFORM 1X8 LF (GAUZE/BANDAGES/DRESSINGS) ×2 IMPLANT
GLOVE BIOGEL PI IND STRL 7.0 (GLOVE) ×4 IMPLANT
GLOVE SS N UNI LF 8.5 STRL (GLOVE) ×2 IMPLANT
GLOVE SURG POLYISO LF SZ8 (GLOVE) ×2 IMPLANT
GOWN STRL REUS W/TWL LRG LVL3 (GOWN DISPOSABLE) ×2 IMPLANT
GOWN STRL REUS W/TWL XL LVL3 (GOWN DISPOSABLE) ×2 IMPLANT
KIT TURNOVER KIT A (KITS) ×2 IMPLANT
MANIFOLD NEPTUNE II (INSTRUMENTS) ×2 IMPLANT
NDL HYPO 21X1.5 SAFETY (NEEDLE) ×2 IMPLANT
NEEDLE HYPO 21X1.5 SAFETY (NEEDLE) ×1
NS IRRIG 1000ML POUR BTL (IV SOLUTION) ×2 IMPLANT
PACK BASIC LIMB (CUSTOM PROCEDURE TRAY) ×2 IMPLANT
PAD ARMBOARD 7.5X6 YLW CONV (MISCELLANEOUS) ×2 IMPLANT
POSITIONER HAND ALUMI XLG (MISCELLANEOUS) ×2 IMPLANT
POSITIONER HEAD 8X9X4 ADT (SOFTGOODS) ×2 IMPLANT
SET BASIN LINEN APH (SET/KITS/TRAYS/PACK) ×2 IMPLANT
SUT ETHILON 3 0 FSL (SUTURE) ×2 IMPLANT
SYR CONTROL 10ML LL (SYRINGE) ×2 IMPLANT

## 2022-12-31 NOTE — Anesthesia Preprocedure Evaluation (Signed)
Anesthesia Evaluation  Patient identified by MRN, date of birth, ID band Patient awake    Reviewed: Allergy & Precautions, H&P , NPO status , Patient's Chart, lab work & pertinent test results, reviewed documented beta blocker date and time   Airway Mallampati: II  TM Distance: >3 FB Neck ROM: full    Dental no notable dental hx.    Pulmonary neg pulmonary ROS, former smoker   Pulmonary exam normal breath sounds clear to auscultation       Cardiovascular Exercise Tolerance: Good hypertension, negative cardio ROS  Rhythm:regular Rate:Normal     Neuro/Psych negative neurological ROS  negative psych ROS   GI/Hepatic negative GI ROS, Neg liver ROS,,,  Endo/Other  negative endocrine ROS    Renal/GU negative Renal ROS  negative genitourinary   Musculoskeletal   Abdominal   Peds  Hematology negative hematology ROS (+)   Anesthesia Other Findings   Reproductive/Obstetrics negative OB ROS                             Anesthesia Physical Anesthesia Plan  ASA: 2  Anesthesia Plan: General   Post-op Pain Management:    Induction:   PONV Risk Score and Plan: Propofol infusion  Airway Management Planned:   Additional Equipment:   Intra-op Plan:   Post-operative Plan:   Informed Consent: I have reviewed the patients History and Physical, chart, labs and discussed the procedure including the risks, benefits and alternatives for the proposed anesthesia with the patient or authorized representative who has indicated his/her understanding and acceptance.     Dental Advisory Given  Plan Discussed with: CRNA  Anesthesia Plan Comments:        Anesthesia Quick Evaluation

## 2022-12-31 NOTE — Transfer of Care (Signed)
Immediate Anesthesia Transfer of Care Note  Patient: Luke Chavez  Procedure(s) Performed: CARPAL TUNNEL RELEASE (Right: Hand)  Patient Location: PACU  Anesthesia Type:General  Level of Consciousness: awake, alert , oriented, and patient cooperative  Airway & Oxygen Therapy: Patient Spontanous Breathing and Patient connected to face mask oxygen  Post-op Assessment: Report given to RN  Post vital signs: Reviewed and stable  Last Vitals:  Vitals Value Taken Time  BP    Temp    Pulse    Resp    SpO2      Last Pain:  Vitals:   12/31/22 0849  PainSc: 2       Patients Stated Pain Goal: 2 (12/31/22 0849)  Complications: No notable events documented.

## 2022-12-31 NOTE — Interval H&P Note (Signed)
History and Physical Interval Note:  12/31/2022 10:13 AM  Luke Chavez  has presented today for surgery, with the diagnosis of Right carpal tunnel syndrom.  The various methods of treatment have been discussed with the patient and family. After consideration of risks, benefits and other options for treatment, the patient has consented to  Procedure(s) with comments: CARPAL TUNNEL RELEASE (Right) - Pt notified of new arrival time 8:30am & can have clear liquids until 6:30am per KF as a surgical intervention.  The patient's history has been reviewed, patient examined, no change in status, stable for surgery.  I have reviewed the patient's chart and labs.  Questions were answered to the patient's satisfaction.     Fuller Canada

## 2022-12-31 NOTE — Op Note (Signed)
12/31/2022  11:07 AM  PATIENT:  Luke Chavez  57 y.o. male  PRE-OPERATIVE DIAGNOSIS:  Right carpal tunnel syndrom  POST-OPERATIVE DIAGNOSIS:  Right carpal tunnel syndrom  FINDINGS: Right median nerve compression   PROCEDURE:  Procedure(s): RIGHT CARPAL TUNNEL RELEASE RIGHT   SURGEON:  Surgeon(s) and Role:    * Vickki Hearing, MD - Primary  PHYSICIAN ASSISTANT:   ASSISTANTS: none   ANESTHESIA: Local with sedation 1% lidocaine  BLOOD ADMINISTERED:none  DRAINS: none   LOCAL MEDICATIONS USED: 1% lidocaine for anesthesia half percent MARCAINE   for post injection  SPECIMEN:  No Specimen  DISPOSITION OF SPECIMEN:  N/A  COUNTS:  YES  TOURNIQUET: 250 mm 22 minutes DICTATION: .Dragon Dictation   Surgeon Romeo Apple  Operative findings compression of the RIGHT MEDIAN NERVE   Indications failure of conservative treatment to relieve pain and paresthesias and numbness and tingling of the RIGHT HAND   The patient was identified in the preop area we confirm the surgical site marked as right wrist. Chart update completed. Patient taken to surgery.  Antibiotic Ancef 2 g after establishing a successful anesthesia with IV sedation the arm was prepped with sterile technique  Timeout executed completed and confirmed site. RIGHT WRIST  /  HAND    Local anesthetic 1% lidocaine injected into the skin edges  Limb exsanguinated with Fornage Esmarch tourniquet elevated to 2 and 50 mmHg   A straight incision was made over the RIGHT carpal tunnel in line with the radial border of the ring finger. Blunt dissection was carried out to find the distal aspect of the carpal tunnel. A blunted judgment was passed beneath the carpal tunnel. Sharp incision was then used to release the transverse carpal ligament. The contents of the carpal tunnel were inspected. The median nerve was compressed with characteristic apple core appearance  The wound was irrigated and then closed with 3-0 nylon  suture. We injected 10 mL of plain Marcaine on the radial side of the incision  A sterile bandage was applied and the tourniquet was released the color of the hand and capillary refill were normal  The patient was taken to the recovery room in stable condition  PLAN OF CARE: Discharge to home after PACU  PATIENT DISPOSITION:  PACU - hemodynamically stable.   Delay start of Pharmacological VTE agent (>24hrs) due to surgical blood loss or risk of bleeding: not applicable

## 2022-12-31 NOTE — Brief Op Note (Signed)
12/31/2022  11:07 AM  PATIENT:  Luke Chavez  57 y.o. male  PRE-OPERATIVE DIAGNOSIS:  Right carpal tunnel syndrom  POST-OPERATIVE DIAGNOSIS:  Right carpal tunnel syndrom  FINDINGS: Right median nerve compression   PROCEDURE:  Procedure(s): RIGHT CARPAL TUNNEL RELEASE RIGHT   SURGEON:  Surgeon(s) and Role:    * Vickki Hearing, MD - Primary  PHYSICIAN ASSISTANT:   ASSISTANTS: none   ANESTHESIA: Local with sedation 1% lidocaine  BLOOD ADMINISTERED:none  DRAINS: none   LOCAL MEDICATIONS USED: 1% lidocaine for anesthesia half percent MARCAINE   for post injection  SPECIMEN:  No Specimen  DISPOSITION OF SPECIMEN:  N/A  COUNTS:  YES  TOURNIQUET: 250 mm 22 minutes DICTATION: .Dragon Dictation   Surgeon Romeo Apple  Operative findings compression of the RIGHT MEDIAN NERVE   Indications failure of conservative treatment to relieve pain and paresthesias and numbness and tingling of the RIGHT HAND   The patient was identified in the preop area we confirm the surgical site marked as right wrist. Chart update completed. Patient taken to surgery.  Antibiotic Ancef 2 g after establishing a successful anesthesia with IV sedation the arm was prepped with sterile technique  Timeout executed completed and confirmed site. RIGHT WRIST  /  HAND    Local anesthetic 1% lidocaine injected into the skin edges  Limb exsanguinated with Fornage Esmarch tourniquet elevated to 2 and 50 mmHg   A straight incision was made over the RIGHT carpal tunnel in line with the radial border of the ring finger. Blunt dissection was carried out to find the distal aspect of the carpal tunnel. A blunted judgment was passed beneath the carpal tunnel. Sharp incision was then used to release the transverse carpal ligament. The contents of the carpal tunnel were inspected. The median nerve was compressed with characteristic apple core appearance  The wound was irrigated and then closed with 3-0 nylon  suture. We injected 10 mL of plain Marcaine on the radial side of the incision  A sterile bandage was applied and the tourniquet was released the color of the hand and capillary refill were normal  The patient was taken to the recovery room in stable condition  PLAN OF CARE: Discharge to home after PACU  PATIENT DISPOSITION:  PACU - hemodynamically stable.   Delay start of Pharmacological VTE agent (>24hrs) due to surgical blood loss or risk of bleeding: not applicable

## 2022-12-31 NOTE — Interval H&P Note (Signed)
History and Physical Interval Note:  12/31/2022 10:11 AM  Luke Chavez  has presented today for surgery, with the diagnosis of Right carpal tunnel syndrom.  The various methods of treatment have been discussed with the patient and family. After consideration of risks, benefits and other options for treatment, the patient has consented to  Procedure(s) with comments: CARPAL TUNNEL RELEASE (Right) - Pt notified of new arrival time 8:30am & can have clear liquids until 6:30am per KF as a surgical intervention.  The patient's history has been reviewed, patient examined, no change in status, stable for surgery.  I have reviewed the patient's chart and labs.  Questions were answered to the patient's satisfaction.     Fuller Canada

## 2023-01-01 NOTE — Anesthesia Postprocedure Evaluation (Signed)
Anesthesia Post Note  Patient: Luke Chavez  Procedure(s) Performed: CARPAL TUNNEL RELEASE (Right: Hand)  Patient location during evaluation: Phase II Anesthesia Type: General Level of consciousness: awake Pain management: pain level controlled Vital Signs Assessment: post-procedure vital signs reviewed and stable Respiratory status: spontaneous breathing and respiratory function stable Cardiovascular status: blood pressure returned to baseline and stable Postop Assessment: no headache and no apparent nausea or vomiting Anesthetic complications: no Comments: Late entry   No notable events documented.   Last Vitals:  Vitals:   12/31/22 1145 12/31/22 1155  BP: 100/67 109/87  Pulse: (!) 52 (!) 55  Resp: 16 16  Temp:  (!) 36.3 C  SpO2: 93% 98%    Last Pain:  Vitals:   12/31/22 1155  TempSrc: Axillary  PainSc: 0-No pain                 Windell Norfolk

## 2023-01-03 ENCOUNTER — Encounter: Payer: Self-pay | Admitting: Orthopedic Surgery

## 2023-01-06 ENCOUNTER — Encounter (HOSPITAL_COMMUNITY): Payer: Self-pay | Admitting: Orthopedic Surgery

## 2023-01-13 ENCOUNTER — Encounter: Payer: BC Managed Care – PPO | Admitting: Orthopedic Surgery

## 2023-01-16 ENCOUNTER — Ambulatory Visit: Payer: BC Managed Care – PPO | Admitting: Orthopedic Surgery

## 2023-01-16 ENCOUNTER — Encounter: Payer: Self-pay | Admitting: Orthopedic Surgery

## 2023-01-16 DIAGNOSIS — G5601 Carpal tunnel syndrome, right upper limb: Secondary | ICD-10-CM

## 2023-01-16 NOTE — Progress Notes (Signed)
Postop visit #1  Right carpal tunnel release  Date of surgery 12/31/2022  Patient here for suture removal and wound check  The wound opened up a little bit all sutures were removed  Patient advised to use soap and water, Neosporin, cover with a Band-Aid  Light duty for 4 weeks  Follow-up 4 weeks check wound

## 2023-01-16 NOTE — Patient Instructions (Addendum)
Soap and water wash wound and neosporin and cover   RTW w/ restriction  Office only x 4 weeks

## 2023-08-04 ENCOUNTER — Ambulatory Visit: Admitting: Orthopedic Surgery

## 2023-08-04 ENCOUNTER — Encounter: Payer: Self-pay | Admitting: Orthopedic Surgery

## 2023-08-04 ENCOUNTER — Other Ambulatory Visit (INDEPENDENT_AMBULATORY_CARE_PROVIDER_SITE_OTHER)

## 2023-08-04 VITALS — BP 158/96 | HR 75 | Ht 74.0 in | Wt 272.0 lb

## 2023-08-04 DIAGNOSIS — G8929 Other chronic pain: Secondary | ICD-10-CM

## 2023-08-04 DIAGNOSIS — M7661 Achilles tendinitis, right leg: Secondary | ICD-10-CM

## 2023-08-04 DIAGNOSIS — M25571 Pain in right ankle and joints of right foot: Secondary | ICD-10-CM

## 2023-08-04 NOTE — Progress Notes (Signed)
  Intake history:  BP (!) 158/96   Pulse 75   Ht 6\' 2"  (1.88 m)   Wt 272 lb (123.4 kg)   BMI 34.92 kg/m  Body mass index is 34.92 kg/m.    WHAT ARE WE SEEING YOU FOR TODAY?   right lower leg(s) From heel up to the knee, states has knot on the knee How long has this bothered you? (DOI?DOS?WS?)  6 month(s) ago  Anticoag.  No  Diabetes No  Heart disease No  Hypertension No  SMOKING HX No  Kidney disease No  Any ALLERGIES _________________ Allergies  Allergen Reactions   Ketorolac  Tromethamine  Rash   _____________________________   Treatment:  Have you taken:  Tylenol  Yes  Advil  Yes  Had PT No  Had injection No  Other  _________________________

## 2023-08-04 NOTE — Patient Instructions (Signed)
 Achilles tendinitis insertional  Recommend Rest Ice Anti-inflammatory of choice (ibuprofen  800 mg 3 times a day or Naprosyn /Aleve  1 twice a day) 1/4-3/8  inch heel insert Dr. Randon Butters donut pad over the heel Stretching strengthening exercises

## 2023-08-04 NOTE — Progress Notes (Signed)
 Chief Complaint  Patient presents with   Leg Pain   58 year old male presents with pain in the posterior aspect of his heel  It has been present now for several months  He notices notices it when he is driving and his foot is resting such as when he is in the cruise control mode  Denies trauma  He tried ibuprofen  without relief  Exam shows that he is ambulatory Normal foot alignment Normal ankle motion No calf tenderness He is tender in the insertional area of the Achilles The Achilles itself is smooth nontender with no nodules Plantarflexion strength is normal Ankle stability tests were normal   DG Ankle Complete Right Result Date: 08/04/2023 Imaging report posterior heel pain AP lateral oblique of the right ankle Ankle mortise is intact Lateral imaging shows a small calcification in the is Achilles and insertion as well as a plantar heel spur Also noted a prominence in the head of the talus just dorsal inferior to the neck area inconsequential Impression Achilles calcification correlates with clinical symptoms     Assessment and plan  Insertional Achilles tendinitis  Rest Ice Anti-inflammatories Elevated heel insert Stretching and strengthening exercises plus or minus formal physical therapy Padding over the heel using donut pad  Return if no improvement

## 2023-11-10 DIAGNOSIS — M25522 Pain in left elbow: Secondary | ICD-10-CM | POA: Diagnosis not present

## 2023-11-10 DIAGNOSIS — R9431 Abnormal electrocardiogram [ECG] [EKG]: Secondary | ICD-10-CM | POA: Diagnosis not present

## 2023-11-10 DIAGNOSIS — M79602 Pain in left arm: Secondary | ICD-10-CM | POA: Diagnosis not present

## 2023-11-10 DIAGNOSIS — M25512 Pain in left shoulder: Secondary | ICD-10-CM | POA: Diagnosis not present

## 2023-11-10 DIAGNOSIS — M542 Cervicalgia: Secondary | ICD-10-CM | POA: Diagnosis not present

## 2023-11-10 DIAGNOSIS — Y990 Civilian activity done for income or pay: Secondary | ICD-10-CM | POA: Diagnosis not present

## 2023-11-10 DIAGNOSIS — X503XXA Overexertion from repetitive movements, initial encounter: Secondary | ICD-10-CM | POA: Diagnosis not present

## 2023-11-10 DIAGNOSIS — M2550 Pain in unspecified joint: Secondary | ICD-10-CM | POA: Diagnosis not present

## 2023-11-10 DIAGNOSIS — Y9339 Activity, other involving climbing, rappelling and jumping off: Secondary | ICD-10-CM | POA: Diagnosis not present

## 2023-11-10 DIAGNOSIS — M5412 Radiculopathy, cervical region: Secondary | ICD-10-CM | POA: Diagnosis not present

## 2023-11-10 DIAGNOSIS — I4519 Other right bundle-branch block: Secondary | ICD-10-CM | POA: Diagnosis not present

## 2023-11-13 DIAGNOSIS — M5412 Radiculopathy, cervical region: Secondary | ICD-10-CM | POA: Diagnosis not present

## 2023-11-26 DIAGNOSIS — M5412 Radiculopathy, cervical region: Secondary | ICD-10-CM | POA: Diagnosis not present

## 2023-12-03 DIAGNOSIS — M542 Cervicalgia: Secondary | ICD-10-CM | POA: Diagnosis not present

## 2023-12-08 DIAGNOSIS — M5412 Radiculopathy, cervical region: Secondary | ICD-10-CM | POA: Diagnosis not present

## 2023-12-10 DIAGNOSIS — M542 Cervicalgia: Secondary | ICD-10-CM | POA: Diagnosis not present

## 2023-12-10 DIAGNOSIS — M5412 Radiculopathy, cervical region: Secondary | ICD-10-CM | POA: Diagnosis not present

## 2023-12-10 DIAGNOSIS — M47812 Spondylosis without myelopathy or radiculopathy, cervical region: Secondary | ICD-10-CM | POA: Diagnosis not present

## 2023-12-15 DIAGNOSIS — M5412 Radiculopathy, cervical region: Secondary | ICD-10-CM | POA: Diagnosis not present

## 2023-12-15 DIAGNOSIS — Z01818 Encounter for other preprocedural examination: Secondary | ICD-10-CM | POA: Diagnosis not present

## 2023-12-15 DIAGNOSIS — I10 Essential (primary) hypertension: Secondary | ICD-10-CM | POA: Diagnosis not present

## 2023-12-15 DIAGNOSIS — Z6836 Body mass index (BMI) 36.0-36.9, adult: Secondary | ICD-10-CM | POA: Diagnosis not present

## 2023-12-16 DIAGNOSIS — M542 Cervicalgia: Secondary | ICD-10-CM | POA: Diagnosis not present

## 2023-12-22 DIAGNOSIS — M542 Cervicalgia: Secondary | ICD-10-CM | POA: Diagnosis not present

## 2023-12-23 DIAGNOSIS — M5412 Radiculopathy, cervical region: Secondary | ICD-10-CM | POA: Diagnosis not present

## 2023-12-24 DIAGNOSIS — M5412 Radiculopathy, cervical region: Secondary | ICD-10-CM | POA: Diagnosis not present

## 2024-01-01 DIAGNOSIS — M545 Low back pain, unspecified: Secondary | ICD-10-CM | POA: Diagnosis not present

## 2024-01-01 DIAGNOSIS — M542 Cervicalgia: Secondary | ICD-10-CM | POA: Diagnosis not present

## 2024-01-12 DIAGNOSIS — I10 Essential (primary) hypertension: Secondary | ICD-10-CM | POA: Diagnosis not present

## 2024-01-12 DIAGNOSIS — M5412 Radiculopathy, cervical region: Secondary | ICD-10-CM | POA: Diagnosis not present

## 2024-02-12 DIAGNOSIS — M542 Cervicalgia: Secondary | ICD-10-CM | POA: Diagnosis not present

## 2024-02-12 DIAGNOSIS — M5412 Radiculopathy, cervical region: Secondary | ICD-10-CM | POA: Diagnosis not present

## 2024-02-20 DIAGNOSIS — M50122 Cervical disc disorder at C5-C6 level with radiculopathy: Secondary | ICD-10-CM | POA: Diagnosis not present
